# Patient Record
Sex: Female | Born: 1935 | Race: White | Hispanic: No | Marital: Married | Smoking: Never smoker
Health system: Southern US, Community
[De-identification: ages and names within clinical notes are randomized; demographics above are authoritative.]

## PROBLEM LIST (undated history)

## (undated) DIAGNOSIS — E785 Hyperlipidemia, unspecified: Secondary | ICD-10-CM

## (undated) DIAGNOSIS — K635 Polyp of colon: Secondary | ICD-10-CM

## (undated) DIAGNOSIS — K5790 Diverticulosis of intestine, part unspecified, without perforation or abscess without bleeding: Secondary | ICD-10-CM

## (undated) DIAGNOSIS — G4730 Sleep apnea, unspecified: Secondary | ICD-10-CM

## (undated) DIAGNOSIS — I10 Essential (primary) hypertension: Secondary | ICD-10-CM

## (undated) DIAGNOSIS — I2510 Atherosclerotic heart disease of native coronary artery without angina pectoris: Secondary | ICD-10-CM

## (undated) DIAGNOSIS — M810 Age-related osteoporosis without current pathological fracture: Secondary | ICD-10-CM

## (undated) DIAGNOSIS — M1990 Unspecified osteoarthritis, unspecified site: Secondary | ICD-10-CM

## (undated) DIAGNOSIS — Z8619 Personal history of other infectious and parasitic diseases: Secondary | ICD-10-CM

## (undated) DIAGNOSIS — Z5189 Encounter for other specified aftercare: Secondary | ICD-10-CM

## (undated) DIAGNOSIS — K219 Gastro-esophageal reflux disease without esophagitis: Secondary | ICD-10-CM

## (undated) HISTORY — PX: COLONOSCOPY: SHX174

## (undated) HISTORY — DX: Sleep apnea, unspecified: G47.30

## (undated) HISTORY — PX: REPLACEMENT TOTAL KNEE BILATERAL: SUR1225

## (undated) HISTORY — DX: Polyp of colon: K63.5

## (undated) HISTORY — PX: CATARACT EXTRACTION, BILATERAL: SHX1313

## (undated) HISTORY — DX: Age-related osteoporosis without current pathological fracture: M81.0

## (undated) HISTORY — PX: UPPER GASTROINTESTINAL ENDOSCOPY: SHX188

## (undated) HISTORY — DX: Personal history of other infectious and parasitic diseases: Z86.19

## (undated) HISTORY — DX: Hyperlipidemia, unspecified: E78.5

## (undated) HISTORY — DX: Diverticulosis of intestine, part unspecified, without perforation or abscess without bleeding: K57.90

## (undated) HISTORY — DX: Atherosclerotic heart disease of native coronary artery without angina pectoris: I25.10

## (undated) HISTORY — DX: Essential (primary) hypertension: I10

## (undated) HISTORY — PX: CARDIAC CATHETERIZATION: SHX172

## (undated) HISTORY — DX: Gastro-esophageal reflux disease without esophagitis: K21.9

## (undated) HISTORY — DX: Encounter for other specified aftercare: Z51.89

## (undated) HISTORY — PX: ABDOMINAL HYSTERECTOMY: SHX81

---

## 2014-05-07 ENCOUNTER — Emergency Department (HOSPITAL_COMMUNITY): Payer: No Typology Code available for payment source

## 2014-05-07 ENCOUNTER — Emergency Department (HOSPITAL_COMMUNITY)
Admission: EM | Admit: 2014-05-07 | Discharge: 2014-05-07 | Disposition: A | Discharge status: Home / Self Care | Payer: No Typology Code available for payment source | Attending: Emergency Medicine | Admitting: Emergency Medicine

## 2014-05-07 ENCOUNTER — Encounter (HOSPITAL_COMMUNITY): Payer: Self-pay | Admitting: Emergency Medicine

## 2014-05-07 DIAGNOSIS — Y92410 Unspecified street and highway as the place of occurrence of the external cause: Secondary | ICD-10-CM | POA: Diagnosis not present

## 2014-05-07 DIAGNOSIS — M1990 Unspecified osteoarthritis, unspecified site: Secondary | ICD-10-CM | POA: Diagnosis not present

## 2014-05-07 DIAGNOSIS — Z792 Long term (current) use of antibiotics: Secondary | ICD-10-CM | POA: Diagnosis not present

## 2014-05-07 DIAGNOSIS — R0789 Other chest pain: Secondary | ICD-10-CM

## 2014-05-07 DIAGNOSIS — Y998 Other external cause status: Secondary | ICD-10-CM | POA: Diagnosis not present

## 2014-05-07 DIAGNOSIS — S0990XA Unspecified injury of head, initial encounter: Secondary | ICD-10-CM | POA: Insufficient documentation

## 2014-05-07 DIAGNOSIS — Z791 Long term (current) use of non-steroidal anti-inflammatories (NSAID): Secondary | ICD-10-CM | POA: Diagnosis not present

## 2014-05-07 DIAGNOSIS — Z880 Allergy status to penicillin: Secondary | ICD-10-CM | POA: Diagnosis not present

## 2014-05-07 DIAGNOSIS — Z79899 Other long term (current) drug therapy: Secondary | ICD-10-CM | POA: Diagnosis not present

## 2014-05-07 DIAGNOSIS — S199XXA Unspecified injury of neck, initial encounter: Secondary | ICD-10-CM | POA: Insufficient documentation

## 2014-05-07 DIAGNOSIS — S60221A Contusion of right hand, initial encounter: Secondary | ICD-10-CM | POA: Insufficient documentation

## 2014-05-07 DIAGNOSIS — S299XXA Unspecified injury of thorax, initial encounter: Secondary | ICD-10-CM | POA: Diagnosis not present

## 2014-05-07 DIAGNOSIS — Y9389 Activity, other specified: Secondary | ICD-10-CM | POA: Diagnosis not present

## 2014-05-07 DIAGNOSIS — Z87891 Personal history of nicotine dependence: Secondary | ICD-10-CM | POA: Insufficient documentation

## 2014-05-07 HISTORY — DX: Unspecified osteoarthritis, unspecified site: M19.90
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
## 2014-05-07 NOTE — Discharge Instructions (Signed)
Chest Wall Pain °Chest wall pain is pain in or around the bones and muscles of your chest. It may take up to 6 weeks to get better. It may take longer if you must stay physically active in your work and activities.  °CAUSES  °Chest wall pain may happen on its own. However, it may be caused by: °· A viral illness like the flu. °· Injury. °· Coughing. °· Exercise. °· Arthritis. °· Fibromyalgia. °· Shingles. °HOME CARE INSTRUCTIONS  °· Avoid overtiring physical activity. Try not to strain or perform activities that cause pain. This includes any activities using your chest or your abdominal and side muscles, especially if heavy weights are used. °· Put ice on the sore area. °¨ Put ice in a plastic bag. °¨ Place a towel between your skin and the bag. °¨ Leave the ice on for 15-20 minutes per hour while awake for the first 2 days. °· Only take over-the-counter or prescription medicines for pain, discomfort, or fever as directed by your caregiver. °SEEK IMMEDIATE MEDICAL CARE IF:  °· Your pain increases, or you are very uncomfortable. °· You have a fever. °· Your chest pain becomes worse. °· You have new, unexplained symptoms. °· You have nausea or vomiting. °· You feel sweaty or lightheaded. °· You have a cough with phlegm (sputum), or you cough up blood. °MAKE SURE YOU:  °· Understand these instructions. °· Will watch your condition. °· Will get help right away if you are not doing well or get worse. °Document Released: 01/16/2005 Document Revised: 04/10/2011 Document Reviewed: 09/12/2010 °ExitCare® Patient Information ©2015 [employer]. This information is not intended to replace advice given to you by your health care provider. Make sure you discuss any questions you have with your health care provider. ° °Contusion °A contusion is a deep bruise. Contusions are the result of an injury that caused bleeding under the skin. The contusion may turn blue, purple, or yellow. Minor injuries will give you a painless  contusion, but more severe contusions may stay painful and swollen for a few weeks.  °CAUSES  °A contusion is usually caused by a blow, trauma, or direct force to an area of the body. °SYMPTOMS  °· Swelling and redness of the injured area. °· Bruising of the injured area. °· Tenderness and soreness of the injured area. °· Pain. °DIAGNOSIS  °The diagnosis can be made by taking a history and physical exam. An X-ray, CT scan, or MRI may be needed to determine if there were any associated injuries, such as fractures. °TREATMENT  °Specific treatment will depend on what area of the body was injured. In general, the best treatment for a contusion is resting, icing, elevating, and applying cold compresses to the injured area. Over-the-counter medicines may also be recommended for pain control. Ask your caregiver what the best treatment is for your contusion. °HOME CARE INSTRUCTIONS  °· Put ice on the injured area. °¨ Put ice in a plastic bag. °¨ Place a towel between your skin and the bag. °¨ Leave the ice on for 15-20 minutes, 3-4 times a day, or as directed by your health care provider. °· Only take over-the-counter or prescription medicines for pain, discomfort, or fever as directed by your caregiver. Your caregiver may recommend avoiding anti-inflammatory medicines (aspirin, ibuprofen, and naproxen) for 48 hours because these medicines may increase bruising. °· Rest the injured area. °· If possible, elevate the injured area to reduce swelling. °SEEK IMMEDIATE MEDICAL CARE IF:  °· You have increased   bruising or swelling. °· You have pain that is getting worse. °· Your swelling or pain is not relieved with medicines. °MAKE SURE YOU:  °· Understand these instructions. °· Will watch your condition. °· Will get help right away if you are not doing well or get worse. °Document Released: 10/26/2004 Document Revised: 01/21/2013 Document Reviewed: 11/21/2010 °ExitCare® Patient Information ©2015 [employer]. This information is  not intended to replace advice given to you by your health care provider. Make sure you discuss any questions you have with your health care provider. ° °Head Injury °You have received a head injury. It does not appear serious at this time. Headaches and vomiting are common following head injury. It should be easy to awaken from sleeping. Sometimes it is necessary for you to stay in the emergency department for a while for observation. Sometimes admission to the hospital may be needed. After injuries such as yours, most problems occur within the first 24 hours, but side effects may occur up to 7-10 days after the injury. It is important for you to carefully monitor your condition and contact your health care provider or seek immediate medical care if there is a change in your condition. °WHAT ARE THE TYPES OF HEAD INJURIES? °Head injuries can be as minor as a bump. Some head injuries can be more severe. More severe head injuries include: °· A jarring injury to the brain (concussion). °· A bruise of the brain (contusion). This mean there is bleeding in the brain that can cause swelling. °· A cracked skull (skull fracture). °· Bleeding in the brain that collects, clots, and forms a bump (hematoma). °WHAT CAUSES A HEAD INJURY? °A serious head injury is most likely to happen to someone who is in a car wreck and is not wearing a seat belt. Other causes of major head injuries include bicycle or motorcycle accidents, sports injuries, and falls. °HOW ARE HEAD INJURIES DIAGNOSED? °A complete history of the event leading to the injury and your current symptoms will be helpful in diagnosing head injuries. Many times, pictures of the brain, such as CT or MRI are needed to see the extent of the injury. Often, an overnight hospital stay is necessary for observation.  °WHEN SHOULD I SEEK IMMEDIATE MEDICAL CARE?  °You should get help right away if: °· You have confusion or drowsiness. °· You feel sick to your stomach (nauseous) or  have continued, forceful vomiting. °· You have dizziness or unsteadiness that is getting worse. °· You have severe, continued headaches not relieved by medicine. Only take over-the-counter or prescription medicines for pain, fever, or discomfort as directed by your health care provider. °· You do not have normal function of the arms or legs or are unable to walk. °· You notice changes in the black spots in the center of the colored part of your eye (pupil). °· You have a clear or bloody fluid coming from your nose or ears. °· You have a loss of vision. °During the next 24 hours after the injury, you must stay with someone who can watch you for the warning signs. This person should contact local emergency services (911 in the [place] if you have seizures, you become unconscious, or you are unable to wake up. °HOW CAN I PREVENT A HEAD INJURY IN THE FUTURE? °The most important factor for preventing major head injuries is avoiding motor vehicle accidents.  To minimize the potential for damage to your head, it is crucial to wear seat belts while riding in motor   vehicles. Wearing helmets while bike riding and playing collision sports (like football) is also helpful. Also, avoiding dangerous activities around the house will further help reduce your risk of head injury.  °WHEN CAN I RETURN TO NORMAL ACTIVITIES AND ATHLETICS? °You should be reevaluated by your health care provider before returning to these activities. If you have any of the following symptoms, you should not return to activities or contact sports until 1 week after the symptoms have stopped: °· Persistent headache. °· Dizziness or vertigo. °· Poor attention and concentration. °· Confusion. °· Memory problems. °· Nausea or vomiting. °· Fatigue or tire easily. °· Irritability. °· Intolerant of bright lights or loud noises. °· Anxiety or depression. °· Disturbed sleep. °MAKE SURE YOU:  °· Understand these instructions. °· Will watch your condition. °· Will get  help right away if you are not doing well or get worse. °Document Released: 01/16/2005 Document Revised: 01/21/2013 Document Reviewed: 09/23/2012 °ExitCare® Patient Information ©2015 [employer]. This information is not intended to replace advice given to you by your health care provider. Make sure you discuss any questions you have with your health care provider. ° °Motor Vehicle Collision °It is common to have multiple bruises and sore muscles after a motor vehicle collision (MVC). These tend to feel worse for the first 24 hours. You may have the most stiffness and soreness over the first several hours. You may also feel worse when you wake up the first morning after your collision. After this point, you will usually begin to improve with each day. The speed of improvement often depends on the severity of the collision, the number of injuries, and the location and nature of these injuries. °HOME CARE INSTRUCTIONS °· Put ice on the injured area. °¨ Put ice in a plastic bag. °¨ Place a towel between your skin and the bag. °¨ Leave the ice on for 15-20 minutes, 3-4 times a day, or as directed by your health care provider. °· Drink enough fluids to keep your urine clear or pale yellow. Do not drink alcohol. °· Take a warm shower or bath once or twice a day. This will increase blood flow to sore muscles. °· You may return to activities as directed by your caregiver. Be careful when lifting, as this may aggravate neck or back pain. °· Only take over-the-counter or prescription medicines for pain, discomfort, or fever as directed by your caregiver. Do not use aspirin. This may increase bruising and bleeding. °SEEK IMMEDIATE MEDICAL CARE IF: °· You have numbness, tingling, or weakness in the arms or legs. °· You develop severe headaches not relieved with medicine. °· You have severe neck pain, especially tenderness in the middle of the back of your neck. °· You have changes in bowel or bladder control. °· There is  increasing pain in any area of the body. °· You have shortness of breath, light-headedness, dizziness, or fainting. °· You have chest pain. °· You feel sick to your stomach (nauseous), throw up (vomit), or sweat. °· You have increasing abdominal discomfort. °· There is blood in your urine, stool, or vomit. °· You have pain in your shoulder (shoulder strap areas). °· You feel your symptoms are getting worse. °MAKE SURE YOU: °· Understand these instructions. °· Will watch your condition. °· Will get help right away if you are not doing well or get worse. °Document Released: 01/16/2005 Document Revised: 06/02/2013 Document Reviewed: 06/15/2010 °ExitCare® Patient Information ©2015 [employer]. This information is not intended to replace advice given   to you by your health care provider. Make sure you discuss any questions you have with your health care provider. ° °

## 2014-05-07 NOTE — ED Notes (Signed)
Discharge instructions given, pt demonstrated teach back and verbal understanding. No concerns voiced.  
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
## 2019-12-15 ENCOUNTER — Telehealth: Payer: Self-pay

## 2019-12-15 NOTE — Telephone Encounter (Signed)
Pt called to establish care with a new GI, pt is wishing to set up an appt with Dr Marina Goodell but has seen [facility] within the past year, advised pt to have records sent over to review.

## 2019-12-18 NOTE — Telephone Encounter (Signed)
Hey Dr Marina Goodell, this pt is wishing to establish care with a new gi because her MRI showed thickening of the esophagus and pt would like a repeat EGD but it looks like the pt has seen [facility] within the past year (unhappy with them). I will send the records to you for review, please advise on scheduling.

## 2019-12-31 NOTE — Telephone Encounter (Signed)
Okay for routine office follow-up with me.  Alonna Buckler, CMA will keep records.

## 2020-01-01 NOTE — Telephone Encounter (Signed)
Records sent down for appointment to be scheduled.

## 2020-01-08 ENCOUNTER — Encounter: Payer: Self-pay | Admitting: Internal Medicine

## 2020-01-08 ENCOUNTER — Ambulatory Visit (INDEPENDENT_AMBULATORY_CARE_PROVIDER_SITE_OTHER): Payer: Medicare Other | Admitting: Internal Medicine

## 2020-01-08 VITALS — BP 112/67 | HR 54 | Ht 66.0 in | Wt 171.0 lb

## 2020-01-08 DIAGNOSIS — R9389 Abnormal findings on diagnostic imaging of other specified body structures: Secondary | ICD-10-CM | POA: Diagnosis not present

## 2020-01-08 DIAGNOSIS — K219 Gastro-esophageal reflux disease without esophagitis: Secondary | ICD-10-CM

## 2020-01-08 DIAGNOSIS — R195 Other fecal abnormalities: Secondary | ICD-10-CM

## 2020-01-08 NOTE — Progress Notes (Signed)
HISTORY OF PRESENT ILLNESS:  [name] is a delightful 86 y.o. female, acquaintance of [name], from [place] with past medical history as listed below who presents herself regarding abnormal CT imaging and the need for upper endoscopy.  Patient was noted to have mild anemia for which she underwent Hemoccult testing with her PCP.  This returned positive.  She then underwent colonoscopy in [place] on October 09, 2019.  He was found to have mild diverticulosis and a diminutive colon polyp which was removed hemoglobin from July 29, 2019 was 12.0.  Patient was having some right lower quadrant/hip pain for which he underwent CT scan of the abdomen and pelvis with contrast November 14, 2019.  No acute abnormalities.  However, there was mild concentric thickening of the distal esophageal wall.  Upper endoscopy recommended.  Patient preferred being evaluated in this office at this time regarding that issue.  She does have a history of chronic reflux for which she takes omeprazole.  This works nicely.  She denies dysphagia.  Weight has been stable.  Had been on NSAIDs for arthritis.  She does have occasional problems with bloating and gas.  She has completed her Covid vaccination series as well as booster  REVIEW OF SYSTEMS:  All non-GI ROS negative unless otherwise stated in the HPI except for arthritis, back pain  Past Medical History:  Diagnosis Date  . Arthritis   . CAD (coronary artery disease)   . Colon polyps   . Diverticulosis   . GERD (gastroesophageal reflux disease)   . History of hepatitis B    + 50 years ago  . Hyperlipemia   . Hypertension     Past Surgical History:  Procedure Laterality Date  . ABDOMINAL HYSTERECTOMY    . CARDIAC CATHETERIZATION    . REPLACEMENT TOTAL KNEE BILATERAL     2 replacement on right knee and one on left knee    Social History [name]  reports that she has never smoked. She has never used smokeless tobacco. She reports  current alcohol use of about 1.0 standard drink of alcohol per week. She reports that she does not use drugs.  family history includes Colon cancer in her paternal grandmother.  Allergies  Allergen Reactions  . Clindamycin/Lincomycin Diarrhea    Increase diarrhea-dx with C.diff  . Codeine Nausea And Vomiting  . Hydrocodone-Acetaminophen   . Nitrofurantoin Other (See Comments)    Chills and fever  . Oxycodone   . Amoxicillin Rash  . Sulfa Antibiotics Rash       PHYSICAL EXAMINATION: Vital signs: BP 112/67   Pulse (!) 54   Ht 5\' 6"  (1.676 m)   Wt 171 lb (77.6 kg)   SpO2 97%   BMI 27.60 kg/m   Constitutional: generally well-appearing, no acute distress Psychiatric: alert and oriented x3, cooperative Eyes: extraocular movements intact, anicteric, conjunctiva pink Mouth: oral pharynx moist, no lesions Neck: supple no lymphadenopathy Cardiovascular: heart regular rate and rhythm, no murmur Lungs: clear to auscultation bilaterally Abdomen: soft, nontender, nondistended, no obvious ascites, no peritoneal signs, normal bowel sounds, no organomegaly Rectal: Omitted Extremities: no clubbing, cyanosis, or lower extremity edema bilaterally Skin: no lesions on visible extremities Neuro: No focal deficits.  Cranial nerves intact  ASSESSMENT:  1.  Chronic GERD.  Asymptomatic on PPI 2.  Esophageal thickening on CT scan.  Requires clarification with endoscopy 3.  Mild anemia and Hemoccult-positive stool 4.  Recent colonoscopy with diminutive polyp and diverticulosis 5.  History of arthritis.  Had been on NSAIDs   PLAN:  1.  Continue PPI 2.  Schedule upper endoscopy.The nature of the procedure, as well as the risks, benefits, and alternatives were carefully and thoroughly reviewed with the patient. Ample time for discussion and questions allowed. The patient understood, was satisfied, and agreed to proceed. 3.  Reflux precautions

## 2020-01-08 NOTE — Patient Instructions (Signed)
If you are age 84 or older, your body mass index should be between 23-30. Your Body mass index is 27.6 kg/m. If this is out of the aforementioned range listed, please consider follow up with your Primary Care Provider.  If you are age 21 or younger, your body mass index should be between 19-25. Your Body mass index is 27.6 kg/m. If this is out of the aformentioned range listed, please consider follow up with your Primary Care Provider.   You have been scheduled for an endoscopy. Please follow written instructions given to you at your visit today. If you use inhalers (even only as needed), please bring them with you on the day of your procedure.

## 2020-02-13 ENCOUNTER — Encounter: Payer: Medicare Other | Admitting: Internal Medicine

## 2020-02-17 ENCOUNTER — Encounter: Payer: Medicare Other | Admitting: Internal Medicine

## 2020-03-11 ENCOUNTER — Ambulatory Visit (AMBULATORY_SURGERY_CENTER): Payer: Medicare Other | Admitting: Internal Medicine

## 2020-03-11 ENCOUNTER — Other Ambulatory Visit: Payer: Self-pay

## 2020-03-11 ENCOUNTER — Encounter: Payer: Self-pay | Admitting: Internal Medicine

## 2020-03-11 VITALS — BP 133/63 | HR 57 | Temp 96.9°F | Resp 15 | Ht 66.0 in | Wt 171.0 lb

## 2020-03-11 DIAGNOSIS — K219 Gastro-esophageal reflux disease without esophagitis: Secondary | ICD-10-CM

## 2020-03-11 DIAGNOSIS — K222 Esophageal obstruction: Secondary | ICD-10-CM | POA: Diagnosis not present

## 2020-03-11 DIAGNOSIS — K449 Diaphragmatic hernia without obstruction or gangrene: Secondary | ICD-10-CM

## 2020-03-11 DIAGNOSIS — R195 Other fecal abnormalities: Secondary | ICD-10-CM

## 2020-03-11 DIAGNOSIS — R9389 Abnormal findings on diagnostic imaging of other specified body structures: Secondary | ICD-10-CM | POA: Diagnosis not present

## 2020-03-11 MED ORDER — SODIUM CHLORIDE 0.9 % IV SOLN: 500.0000 mL | Freq: Once | INTRAVENOUS | Status: DC

## 2020-03-11 NOTE — Patient Instructions (Signed)
Please read handouts provided. Continue present medications. Return to care of primary provider.     YOU HAD AN ENDOSCOPIC PROCEDURE TODAY AT THE [facility]:   Refer to the procedure report that was given to you for any specific questions about what was found during the examination.  If the procedure report does not answer your questions, please call your gastroenterologist to clarify.  If you requested that your care partner not be given the details of your procedure findings, then the procedure report has been included in a sealed envelope for you to review at your convenience later.  YOU SHOULD EXPECT: Some feelings of bloating in the abdomen. Passage of more gas than usual.  Walking can help get rid of the air that was put into your GI tract during the procedure and reduce the bloating. If you had a lower endoscopy (such as a colonoscopy or flexible sigmoidoscopy) you may notice spotting of blood in your stool or on the toilet paper. If you underwent a bowel prep for your procedure, you may not have a normal bowel movement for a few days.  Please Note:  You might notice some irritation and congestion in your nose or some drainage.  This is from the oxygen used during your procedure.  There is no need for concern and it should clear up in a day or so.  SYMPTOMS TO REPORT IMMEDIATELY:    Following upper endoscopy (EGD)  Vomiting of blood or coffee ground material  New chest pain or pain under the shoulder blades  Painful or persistently difficult swallowing  New shortness of breath  Fever of 100F or higher  Black, tarry-looking stools  For urgent or emergent issues, a gastroenterologist can be reached at any hour by calling [phone]. Do not use MyChart messaging for urgent concerns.    DIET:  We do recommend a small meal at first, but then you may proceed to your regular diet.  Drink plenty of fluids but you should avoid alcoholic beverages for 24  hours.  ACTIVITY:  You should plan to take it easy for the rest of today and you should NOT DRIVE or use heavy machinery until tomorrow (because of the sedation medicines used during the test).    FOLLOW UP: Our staff will call the number listed on your records 48-72 hours following your procedure to check on you and address any questions or concerns that you may have regarding the information given to you following your procedure. If we do not reach you, we will leave a message.  We will attempt to reach you two times.  During this call, we will ask if you have developed any symptoms of COVID 19. If you develop any symptoms (ie: fever, flu-like symptoms, shortness of breath, cough etc.) before then, please call [phone].  If you test positive for Covid 19 in the 2 weeks post procedure, please call and report this information to [place].    If any biopsies were taken you will be contacted by phone or by letter within the next 1-3 weeks.  Please call us at [phone] if you have not heard about the biopsies in 3 weeks.    SIGNATURES/CONFIDENTIALITY: You and/or your care partner have signed paperwork which will be entered into your electronic medical record.  These signatures attest to the fact that that the information above on your After Visit Summary has been reviewed and is understood.  Full responsibility of the confidentiality of this discharge information lies with  you and/or your care-partner.

## 2020-03-11 NOTE — Progress Notes (Signed)
VS- Suzanne Hodges RN 

## 2020-03-11 NOTE — Op Note (Signed)
[facility] Patient Name: [name] Procedure Date: 03/11/2020 10:22 AM MRN: [id number] Endoscopist: Wilhemina Bonito. Marina Goodell , MD Age: 87 Referring MD:  Date of Birth: 05-Nov-1930 Gender: Female Account #: [id number] Procedure:                Upper GI endoscopy Indications:              Esophageal reflux, Heme positive stool, Abnormal CT                            of the GI tract (esophageal thickening) Medicines:                Monitored Anesthesia Care Procedure:                Pre-Anesthesia Assessment:                           - Prior to the procedure, a History and Physical                            was performed, and patient medications and                            allergies were reviewed. The patient's tolerance of                            previous anesthesia was also reviewed. The risks                            and benefits of the procedure and the sedation                            options and risks were discussed with the patient.                            All questions were answered, and informed consent                            was obtained. Prior Anticoagulants: The patient has                            taken no previous anticoagulant or antiplatelet                            agents. ASA Grade Assessment: II - A patient with                            mild systemic disease. After reviewing the risks                            and benefits, the patient was deemed in                            satisfactory condition to undergo the procedure.  After obtaining informed consent, the endoscope was                            passed under direct vision. Throughout the                            procedure, the patient's blood pressure, pulse, and                            oxygen saturations were monitored continuously. The                            Endoscope was introduced through the mouth, and                            advanced to the  second part of duodenum. The upper                            GI endoscopy was accomplished without difficulty.                            The patient tolerated the procedure well. Scope In: Scope Out: Findings:                 The esophagus revealed a large caliber distal                            esophageal ring, but was otherwise normal.                           The stomach was normal, save small hiatal hernia.                           The examined duodenum was normal.                           The cardia and gastric fundus were normal on                            retroflexion. Complications:            No immediate complications. Estimated Blood Loss:     Estimated blood loss: none. Impression:               1. GERD                           2. Incidental distal esophageal ring                           3. Small hiatal hernia. Otherwise normal EGD. Recommendation:           - Patient has a contact number available for                            emergencies. The signs and symptoms of potential  delayed complications were discussed with the                            patient. Return to normal activities tomorrow.                            Written discharge instructions were provided to the                            patient.                           - Resume previous diet.                           - Continue present medications.                           - Return to the care of your primary provider Wilhemina Bonito. Marina Goodell, MD 03/11/2020 10:38:37 AM This report has been signed electronically.

## 2020-03-15 ENCOUNTER — Telehealth: Payer: Self-pay | Admitting: *Deleted

## 2020-03-15 NOTE — Telephone Encounter (Signed)
  Follow up Call-  Call back number 03/11/2020  Post procedure Call Back phone  # [phone]  Permission to leave phone message Yes  Some recent data might be hidden     Patient questions:  Do you have a fever, pain , or abdominal swelling? No. Pain Score  0 *  Have you tolerated food without any problems? Yes.    Have you been able to return to your normal activities? Yes.    Do you have any questions about your discharge instructions: Diet   No. Medications  No. Follow up visit  No.  Do you have questions or concerns about your Care? No.  Actions: * If pain score is 4 or above: No action needed, pain <4.  1. Have you developed a fever since your procedure? no  2.   Have you had an respiratory symptoms (SOB or cough) since your procedure? no  3.   Have you tested positive for COVID 19 since your procedure no  4.   Have you had any family members/close contacts diagnosed with the COVID 19 since your procedure?  no   If yes to any of these questions please route to Laverna Peace, RN and Karlton Lemon, RN

## 2022-03-20 ENCOUNTER — Encounter (HOSPITAL_BASED_OUTPATIENT_CLINIC_OR_DEPARTMENT_OTHER): Payer: Medicare Other | Attending: General Surgery | Admitting: General Surgery

## 2022-03-20 DIAGNOSIS — I10 Essential (primary) hypertension: Secondary | ICD-10-CM | POA: Diagnosis not present

## 2022-03-20 DIAGNOSIS — I890 Lymphedema, not elsewhere classified: Secondary | ICD-10-CM | POA: Insufficient documentation

## 2022-03-20 DIAGNOSIS — I2510 Atherosclerotic heart disease of native coronary artery without angina pectoris: Secondary | ICD-10-CM | POA: Insufficient documentation

## 2022-03-20 DIAGNOSIS — L97511 Non-pressure chronic ulcer of other part of right foot limited to breakdown of skin: Secondary | ICD-10-CM | POA: Diagnosis not present

## 2022-03-20 DIAGNOSIS — L97822 Non-pressure chronic ulcer of other part of left lower leg with fat layer exposed: Secondary | ICD-10-CM | POA: Diagnosis not present

## 2022-03-20 DIAGNOSIS — L97811 Non-pressure chronic ulcer of other part of right lower leg limited to breakdown of skin: Secondary | ICD-10-CM | POA: Insufficient documentation

## 2022-03-20 DIAGNOSIS — I872 Venous insufficiency (chronic) (peripheral): Secondary | ICD-10-CM | POA: Diagnosis not present

## 2022-03-21 NOTE — Progress Notes (Signed)
[name] ([id number]) [id number] Page 1 of 4 Visit Report for 03/20/2022 Abuse Risk Screen Details Patient Name: Date of Service: [name], [name] 03/20/2022 12:30 PM Medical Record Number: [id number] Patient Account Number: [id number] Date of Birth/Sex: Treating RN: 26-Oct-1930 (89 y.o. [name] Primary Care Denia Mcvicar: [name] Other Clinician: Referring Kaysee Hergert: Treating Antwyne Pingree/Extender: Jadene Pierini in Treatment: 0 Abuse Risk Screen Items Answer ABUSE RISK SCREEN: Has anyone close to you tried to hurt or harm you recentlyo No Do you feel uncomfortable with anyone in your familyo No Has anyone forced you do things that you didnt want to doo No Electronic Signature(s) Signed: 03/20/2022 4:41:15 PM By: [name] Entered By: [name] on 03/20/2022 12:50:34 -------------------------------------------------------------------------------- Activities of Daily Living Details Patient Name: Date of Service: [name] 03/20/2022 12:30 PM Medical Record Number: [id number] Patient Account Number: [id number] Date of Birth/Sex: Treating RN: [birth year] (89 y.o. [name] Primary Care Keeton Kassebaum: [name] Other Clinician: Referring Layaan Mott: Treating Joie Hipps/Extender: Jadene Pierini in Treatment: 0 Activities of Daily Living Items Answer Activities of Daily Living (Please select one for each item) Drive Automobile Completely Able T Medications ake Completely Able Use T elephone Completely Able Care for Appearance Completely Able Use T oilet Completely Able Bath / Shower Completely Able Dress Self Completely Able Feed Self Completely Able Walk Need Assistance Get In / Out Bed Completely Able Housework Need Assistance Prepare Meals Need Assistance Handle Money Completely Able Shop for Self Need Assistance Electronic Signature(s) Signed: 03/20/2022 4:41:15 PM By:  [name] Entered By: [name] on 03/20/2022 12:51:04 [name] ([id number]) [id number] Page 2 of 4 -------------------------------------------------------------------------------- Education Screening Details Patient Name: Date of Service: [name], [name] 03/20/2022 12:30 PM Medical Record Number: [id number] Patient Account Number: [id number] Date of Birth/Sex: Treating RN: 1930/12/04 (89 y.o. [name] Primary Care [name]: [name] Other Clinician: Referring Shresta Risden: Treating Donni Oglesby/Extender: Jadene Pierini in Treatment: 0 Primary Learner Assessed: Patient Learning Preferences/Education Level/Primary Language Learning Preference: Explanation, Demonstration, Video, Printed Material Highest Education Level: College or Above Preferred Language: English Cognitive Barrier Language Barrier: No Translator Needed: No Memory Deficit: No Emotional Barrier: No Cultural/Religious Beliefs Affecting Medical Care: No Physical Barrier Impaired Vision: No Impaired Hearing: No Decreased Hand dexterity: No Knowledge/Comprehension Knowledge Level: Medium Comprehension Level: Medium Ability to understand written instructions: Medium Ability to understand verbal instructions: Medium Motivation Anxiety Level: Calm Cooperation: Cooperative Education Importance: Acknowledges Need Interest in Health Problems: Asks Questions Perception: Coherent Willingness to Engage in Self-Management Medium Activities: Readiness to Engage in Self-Management Medium Activities: Electronic Signature(s) Signed: 03/20/2022 4:41:15 PM By: [name] Entered By: [name] on 03/20/2022 12:51:30 -------------------------------------------------------------------------------- Fall Risk Assessment Details Patient Name: Date of Service: [name], [name] 03/20/2022 12:30 PM Medical Record Number: [id number] Patient  Account Number: [id number] Date of Birth/Sex: Treating RN: December 23, 1930 (89 y.o. [name] Primary Care [name]: [name] Other Clinician: Referring [name]: Treating Waller Marcussen/Extender: Jadene Pierini in Treatment: 0 Fall Risk Assessment Items Have you had 2 or more falls in the last [address] [place], [place] ([id number]) [phone] [id number] Page 3 of 4 Have you had any fall that resulted in injury in the last 12 monthso 0 Yes FALLS RISK SCREEN History of falling - immediate or within 3 months 25 Yes Secondary diagnosis (Do you have 2 or more medical diagnoseso) 15 Yes Ambulatory aid None/bed rest/wheelchair/nurse 0 No Crutches/cane/walker 15 Yes Furniture 0 No Intravenous therapy  Access/Saline/Heparin Lock 0 No Gait/Transferring Normal/ bed rest/ wheelchair 0 No Weak (short steps with or without shuffle, stooped but able to lift head while walking, may seek 10 Yes support from furniture) Impaired (short steps with shuffle, may have difficulty arising from chair, head down, impaired 20 Yes balance) Mental Status Oriented to own ability 0 Yes Electronic Signature(s) Signed: 03/20/2022 4:41:15 PM By: [name] Entered By: [name] on 03/20/2022 12:52:01 -------------------------------------------------------------------------------- Foot Assessment Details Patient Name: Date of Service: [name], [name] 03/20/2022 12:30 PM Medical Record Number: [id number] Patient Account Number: [id number] Date of Birth/Sex: Treating RN: September 12, 1930 (89 y.o. [name] Primary Care Zachry Hopfensperger: [name] Other Clinician: Referring Abubakr Wieman: Treating [name]/Extender: Jadene Pierini in Treatment: 0 Foot Assessment Items Site Locations + = Sensation present, - = Sensation absent, C = Callus, U = Ulcer R = Redness, W = Warmth, M = Maceration, PU = Pre-ulcerative lesion F = Fissure, S = Swelling, D =  Dryness Assessment Right: Left: Other Deformity: No No Prior Foot Ulcer: No No Prior Amputation: No No Charcot Joint: No No Ambulatory Status: Ambulatory With Help Assistance Device: Graviela Shubin, Arbie [name] ([id number]) [phone] [id number] Page 4 of 4 Gait: Notes no hx of diabetes or neuropathy Electronic Signature(s) Signed: 03/20/2022 4:41:15 PM By: [name] Entered By: [name] on 03/20/2022 12:52:50 -------------------------------------------------------------------------------- Nutrition Risk Screening Details Patient Name: Date of Service: [name], [name] 03/20/2022 12:30 PM Medical Record Number: [id number] Patient Account Number: [id number] Date of Birth/Sex: Treating RN: [birth year] (89 y.o. [name] Primary Care Violette Morneault: [name] Other Clinician: Referring Johanna Matto: Treating Silviano Neuser/Extender: Jadene Pierini in Treatment: 0 Height (in): Weight (lbs): Body Mass Index (BMI): Nutrition Risk Screening Items Score Screening NUTRITION RISK SCREEN: I have an illness or condition that made me change the kind and/or amount of food I eat 0 No I eat fewer than two meals per day 0 No I eat few fruits and vegetables, or milk products 0 No I have three or more drinks of beer, liquor or wine almost every day 0 No I have tooth or mouth problems that make it hard for me to eat 0 No I don't always have enough money to buy the food I need 0 No I eat alone most of the time 0 No I take three or more different prescribed or over-the-counter drugs a day 1 Yes Without wanting to, I have lost or gained 10 pounds in the last six months 0 No I am not always physically able to shop, cook and/or feed myself 2 Yes Nutrition Protocols Good Risk Protocol Moderate Risk Protocol 0 Provide education on nutrition High Risk Proctocol Risk Level: Moderate Risk Score: 3 Electronic Signature(s) Signed: 03/20/2022 4:41:15 PM By:  [name] Entered By: [name] on 03/20/2022 12:52:29

## 2022-03-21 NOTE — Progress Notes (Addendum)
AADRIKA, JUNGBLUTH ([id number]) [id number] Page 1 of 13 Visit Report for 03/20/2022 Chief Complaint Document Details Patient Name: Date of Service: [name], [name] 03/20/2022 12:30 PM Medical Record Number: [id number] Patient Account Number: [id number] Date of Birth/Sex: Treating RN: April 30, 1930 (89 y.o. F) Primary Care Provider: [name] Other Clinician: Referring Provider: Treating Provider/Extender: Jadene Pierini in Treatment: 0 Information Obtained from: Patient Chief Complaint Patient presents for treatment of open ulcers due to venous insufficiency Electronic Signature(s) Signed: 03/20/2022 1:50:55 PM By: Fredirick Maudlin MD FACS Previous Signature: 03/20/2022 12:47:33 PM Version By: Fredirick Maudlin MD FACS Entered By: Fredirick Maudlin on 03/20/2022 13:50:54 -------------------------------------------------------------------------------- Debridement Details Patient Name: Date of Service: [name], [name] 03/20/2022 12:30 PM Medical Record Number: [id number] Patient Account Number: [id number] Date of Birth/Sex: Treating RN: [birth year] (89 y.o. [name] Primary Care Provider: [name] Other Clinician: Referring Provider: Treating Provider/Extender: Jadene Pierini in Treatment: 0 Debridement Performed for Assessment: Wound #1 Left,Distal,Lateral Lower Leg Performed By: Physician Fredirick Maudlin, MD Debridement Type: Debridement Level of Consciousness (Pre-procedure): Awake and Alert Pre-procedure Verification/Time Out Yes - 13:23 Taken: Start Time: 13:23 Pain Control: Lidocaine 4% T opical Solution T Area Debrided (L x W): otal 1.2 (cm) x 0.5 (cm) = 0.6 (cm) Tissue and other material debrided: Non-Viable, Slough, Subcutaneous, Slough Level: Skin/Subcutaneous Tissue Debridement Description: Excisional Instrument: Curette Bleeding: Minimum Hemostasis Achieved: Pressure Response to Treatment: Procedure was  tolerated well Level of Consciousness (Post- Awake and Alert procedure): Post Debridement Measurements of Total Wound Length: (cm) 1.2 Width: (cm) 0.5 Depth: (cm) 0.2 Volume: (cm) 0.094 Character of Wound/Ulcer Post Debridement: Improved Post Procedure Diagnosis Same as Pre-procedure Notes scribed for Dr. Celine Ahr by [name], RN Electronic Signature(s) Signed: 03/20/2022 4:41:15 PM By: [name] Signed: 03/20/2022 5:01:55 PM By: Fredirick Maudlin MD FACS Entered By: [name] on 03/20/2022 13:24:55 [name], [name] ([id number]) [id number] Page 2 of 13 -------------------------------------------------------------------------------- Debridement Details Patient Name: Date of Service: [name], [name] 03/20/2022 12:30 PM Medical Record Number: [id number] Patient Account Number: [id number] Date of Birth/Sex: Treating RN: 03/20/1930 (89 y.o. [name] Primary Care Provider: [name] Other Clinician: Referring Provider: Treating Provider/Extender: Jadene Pierini in Treatment: 0 Debridement Performed for Assessment: Wound #2 Left,Proximal,Lateral Lower Leg Performed By: Physician Fredirick Maudlin, MD Debridement Type: Debridement Level of Consciousness (Pre-procedure): Awake and Alert Pre-procedure Verification/Time Out Yes - 13:23 Taken: Start Time: 13:23 Pain Control: Lidocaine 4% Topical Solution T Area Debrided (L x W): otal 2 (cm) x 2 (cm) = 4 (cm) Tissue and other material debrided: Non-Viable, Eschar, Slough, Subcutaneous, Slough Level: Skin/Subcutaneous Tissue Debridement Description: Excisional Instrument: Curette Bleeding: Minimum Hemostasis Achieved: Pressure Response to Treatment: Procedure was tolerated well Level of Consciousness (Post- Awake and Alert procedure): Post Debridement Measurements of Total Wound Length: (cm) 2 Width: (cm) 2 Depth: (cm) 0.1 Volume: (cm) 0.314 Character  of Wound/Ulcer Post Debridement: Improved Post Procedure Diagnosis Same as Pre-procedure Notes scribed for Dr. Celine Ahr by [name], RN Electronic Signature(s) Signed: 03/20/2022 4:41:15 PM By: [name] Signed: 03/20/2022 5:01:55 PM By: Fredirick Maudlin MD FACS Entered By: [name] on 03/20/2022 13:26:15 -------------------------------------------------------------------------------- Debridement Details Patient Name: Date of Service: [name], [name] 03/20/2022 12:30 PM Medical Record Number: [id number] Patient Account Number: [id number] Date of Birth/Sex: Treating RN: 1930/04/24 (89 y.o. [name] Primary Care Provider: [name] Other Clinician: Referring Provider: Treating Provider/Extender: Jadene Pierini in Treatment: 0 Debridement Performed for Assessment: Wound #3 Right,Anterior Lower Leg  Performed By: Physician Fredirick Maudlin, MD Debridement Type: Debridement Level of Consciousness (Pre-procedure): Awake and Alert Pre-procedure Verification/Time Out Yes - 13:23 Taken: Start Time: 13:23 Pain Control: Lidocaine 4% T opical Solution T Area Debrided (L x W): otal 0.3 (cm) x 0.3 (cm) = 0.09 (cm) Tissue and other material debrided: Non-Viable, Eschar, Slough, Slough Level: Non-Viable Tissue Debridement Description: Selective/Open Wound Instrument: Curette Bleeding: Minimum Hemostasis Achieved: Pressure Response to Treatment: Procedure was tolerated well Level of Consciousness [name], [name] ([id number]) [id number] Page 3 of 13 Level of Consciousness (Post- Awake and Alert procedure): Post Debridement Measurements of Total Wound Length: (cm) 0.3 Width: (cm) 0.3 Depth: (cm) 0.1 Volume: (cm) 0.007 Character of Wound/Ulcer Post Debridement: Improved Post Procedure Diagnosis Same as Pre-procedure Notes scribed for Dr. Celine Ahr by [name], RN Electronic Signature(s) Signed:  03/20/2022 4:41:15 PM By: [name] Signed: 03/20/2022 5:01:55 PM By: Fredirick Maudlin MD FACS Entered By: [name] on 03/20/2022 13:27:11 -------------------------------------------------------------------------------- Debridement Details Patient Name: Date of Service: [name], [name] 03/20/2022 12:30 PM Medical Record Number: [id number] Patient Account Number: [id number] Date of Birth/Sex: Treating RN: 02/24/1930 (89 y.o. [name] Primary Care Provider: [name] Other Clinician: Referring Provider: Treating Provider/Extender: Jadene Pierini in Treatment: 0 Debridement Performed for Assessment: Wound #4 Right T Second oe Performed By: Physician Fredirick Maudlin, MD Debridement Type: Debridement Level of Consciousness (Pre-procedure): Awake and Alert Pre-procedure Verification/Time Out Yes - 13:23 Taken: Start Time: 13:23 Pain Control: Lidocaine 4% T opical Solution T Area Debrided (L x W): otal 0.2 (cm) x 0.2 (cm) = 0.04 (cm) Tissue and other material debrided: Non-Viable, Callus, Slough, Slough Level: Non-Viable Tissue Debridement Description: Selective/Open Wound Instrument: Curette Bleeding: Minimum Hemostasis Achieved: Pressure Response to Treatment: Procedure was tolerated well Level of Consciousness (Post- Awake and Alert procedure): Post Debridement Measurements of Total Wound Length: (cm) 0.2 Stage: Category/Stage II Width: (cm) 0.2 Depth: (cm) 0.1 Volume: (cm) 0.003 Character of Wound/Ulcer Post Debridement: Improved Post Procedure Diagnosis Same as Pre-procedure Notes scribed for Dr. Celine Ahr by [name], RN Electronic Signature(s) Signed: 03/20/2022 4:41:15 PM By: [name] Signed: 03/20/2022 5:01:55 PM By: Fredirick Maudlin MD FACS Entered By: [name] on 03/20/2022 13:29:24 HPI Details -------------------------------------------------------------------------------- [name]  ([id number]) [id number] Page 4 of 13 Patient Name: Date of Service: [name], [name] 03/20/2022 12:30 PM Medical Record Number: [id number] Patient Account Number: [id number] Date of Birth/Sex: Treating RN: 1930-04-30 (89 y.o. F) Primary Care Provider: [name] Other Clinician: Referring Provider: Treating Provider/Extender: Jadene Pierini in Treatment: 0 History of Present Illness HPI Description: ADMISSION 03/20/2022 This is an 89 year old woman with coronary artery disease, on chronic corticosteroids for osteoarthritis, who presents with bilateral lower leg ulcers. They have been present for a couple of weeks. I do not have any vascular studies available to discern whether or not she has had venous reflux or arterial studies done. ABIs in clinic were 1.34 on the left and 0.95 on the right. She is not diabetic. She does not smoke. Her legs are quite edematous and she has multiple hematomas of various sizes and ages extending up to at least the distal thigh. She has 3 ulcers on the left leg with slough and eschar accumulation. On the right leg, she has a tiny superficial ulcer on the anterior tibial surface. She also has a small ulcer on the lateral aspect of her right third toe. Electronic Signature(s) Signed: 03/20/2022 1:58:48 PM By: Fredirick Maudlin MD FACS Previous Signature: 03/20/2022 12:48:57 PM  Version By: Fredirick Maudlin MD FACS Entered By: Fredirick Maudlin on 03/20/2022 13:58:47 -------------------------------------------------------------------------------- Physical Exam Details Patient Name: Date of Service: [name], [name] 03/20/2022 12:30 PM Medical Record Number: [id number] Patient Account Number: [id number] Date of Birth/Sex: Treating RN: 1930-05-11 (89 y.o. F) Primary Care Provider: [name] Other Clinician: Referring Provider: Treating Provider/Extender: Jadene Pierini in Treatment: 0 Constitutional . . . . No  acute distress. Respiratory Normal work of breathing on room air. Cardiovascular 2+ pitting edema bilaterally. Notes 03/20/2022: Her legs are quite edematous and she has multiple hematomas of various sizes and ages extending up to at least the distal thigh. She has 3 ulcers on the left leg with slough and eschar accumulation. On the right leg, she has a tiny superficial ulcer on the anterior tibial surface. She also has a small ulcer on the lateral aspect of her right third toe. Electronic Signature(s) Signed: 03/20/2022 1:59:34 PM By: Fredirick Maudlin MD FACS Entered By: Fredirick Maudlin on 03/20/2022 13:59:34 -------------------------------------------------------------------------------- Physician Orders Details Patient Name: Date of Service: [name], [name] 03/20/2022 12:30 PM Medical Record Number: [id number] Patient Account Number: [id number] Date of Birth/Sex: Treating RN: 1930-03-01 (89 y.o. [name] Primary Care Provider: [name] Other Clinician: Referring Provider: Treating Provider/Extender: Jadene Pierini in Treatment: 0 Verbal / Phone Orders: No Diagnosis Coding ICD-10 Coding Code Description L97.811 Non-pressure chronic ulcer of other part of right lower leg limited to breakdown of skin L97.822 Non-pressure chronic ulcer of other part of left lower leg with fat layer exposed [place], [place] ([id number]) [id number] Page 5 of 13 L97.511 Non-pressure chronic ulcer of other part of right foot limited to breakdown of skin R60.0 Localized edema I87.2 Venous insufficiency (chronic) (peripheral) Z79.52 Long term (current) use of systemic steroids Follow-up Appointments ppointment in 1 week. - Dr. Celine Ahr - room 2 Return A Anesthetic (In clinic) Topical Lidocaine 4% applied to wound bed Bathing/ Shower/ Hygiene May shower with protection but do not get wound dressing(s) wet. Protect dressing(s) with water repellant cover  (for example, large plastic bag) or a cast cover and may then take shower. Edema Control - Lymphedema / SCD / Other Elevate legs to the level of the heart or above for 30 minutes daily and/or when sitting for 3-4 times a day throughout the day. Avoid standing for long periods of time. Wound Treatment Wound #1 - Lower Leg Wound Laterality: Left, Lateral, Distal Cleanser: Soap and Water 1 x Per Week/30 Days Discharge Instructions: May shower and wash wound with dial antibacterial soap and water prior to dressing change. Cleanser: Wound Cleanser 1 x Per Week/30 Days Discharge Instructions: Cleanse the wound with wound cleanser prior to applying a clean dressing using gauze sponges, not tissue or cotton balls. Peri-Wound Care: Sween Lotion (Moisturizing lotion) 1 x Per Week/30 Days Discharge Instructions: Apply moisturizing lotion as directed Prim Dressing: Sorbalgon AG Dressing 2x2 (in/in) 1 x Per Week/30 Days ary Discharge Instructions: Apply to wound bed as instructed Secondary Dressing: Woven Gauze Sponge, Non-Sterile 4x4 in 1 x Per Week/30 Days Discharge Instructions: Apply over primary dressing as directed. Compression Wrap: ThreePress (3 layer compression wrap) 1 x Per Week/30 Days Discharge Instructions: Apply three layer compression as directed. Compression Stockings: Circaid Juxta Lite Compression Wrap (DME) Left Leg Compression Amount: 30-40 mmHG Discharge Instructions: Apply Circaid Juxta Lite Compression Wrap daily as instructed. Apply first thing in the morning, remove at night before bed. Wound #2 - Lower Leg Wound Laterality: Left, Lateral, Proximal Cleanser:  Soap and Water 1 x Per Week/30 Days Discharge Instructions: May shower and wash wound with dial antibacterial soap and water prior to dressing change. Cleanser: Wound Cleanser 1 x Per Week/30 Days Discharge Instructions: Cleanse the wound with wound cleanser prior to applying a clean dressing using gauze sponges, not  tissue or cotton balls. Peri-Wound Care: Sween Lotion (Moisturizing lotion) 1 x Per Week/30 Days Discharge Instructions: Apply moisturizing lotion as directed Prim Dressing: Sorbalgon AG Dressing 2x2 (in/in) 1 x Per Week/30 Days ary Discharge Instructions: Apply to wound bed as instructed Secondary Dressing: Woven Gauze Sponge, Non-Sterile 4x4 in 1 x Per Week/30 Days Discharge Instructions: Apply over primary dressing as directed. Compression Wrap: ThreePress (3 layer compression wrap) 1 x Per Week/30 Days Discharge Instructions: Apply three layer compression as directed. Wound #3 - Lower Leg Wound Laterality: Right, Anterior Cleanser: Soap and Water 1 x Per Week/30 Days Discharge Instructions: May shower and wash wound with dial antibacterial soap and water prior to dressing change. Cleanser: Wound Cleanser 1 x Per Week/30 Days Discharge Instructions: Cleanse the wound with wound cleanser prior to applying a clean dressing using gauze sponges, not tissue or cotton balls. Peri-Wound Care: Sween Lotion (Moisturizing lotion) 1 x Per Week/30 Days Discharge Instructions: Apply moisturizing lotion as directed Prim Dressing: Sorbalgon AG Dressing 2x2 (in/in) 1 x Per Week/30 Days ary Discharge Instructions: Apply to wound bed as instructed [name], [name] ([id number]) [id number] Page 6 of 13 Secondary Dressing: Woven Gauze Sponge, Non-Sterile 4x4 in 1 x Per Week/30 Days Discharge Instructions: Apply over primary dressing as directed. Compression Wrap: ThreePress (3 layer compression wrap) (Generic) 1 x Per Week/30 Days Discharge Instructions: Apply three layer compression as directed. Compression Stockings: Circaid Juxta Lite Compression Wrap (DME) Right Leg Compression Amount: 30-40 mmHG Discharge Instructions: Apply Circaid Juxta Lite Compression Wrap daily as instructed. Apply first thing in the morning, remove at night before bed. Wound #4 - T Third oe Wound  Laterality: Right, Lateral Cleanser: Soap and Water 1 x Per Day/15 Days Discharge Instructions: May shower and wash wound with dial antibacterial soap and water prior to dressing change. Cleanser: Wound Cleanser 1 x Per Day/15 Days Discharge Instructions: Cleanse the wound with wound cleanser prior to applying a clean dressing using gauze sponges, not tissue or cotton balls. Prim Dressing: Sorbalgon AG Dressing 2x2 (in/in) (DME) (Generic) 1 x Per Day/15 Days ary Discharge Instructions: Apply to wound bed as instructed Secondary Dressing: Woven Gauze Sponges 2x2 in 1 x Per Day/15 Days Discharge Instructions: Apply over primary dressing as directed. Secured With: Child psychotherapist, Sterile 2x75 (in/in) (DME) (Generic) 1 x Per Day/15 Days Discharge Instructions: Secure with stretch gauze as directed. Secured With: 66M Medipore Public affairs consultant Surgical T 2x10 (in/yd) (DME) (Generic) 1 x Per Day/15 Days ape Discharge Instructions: Secure with tape as directed. Patient Medications llergies: clindamycin, codeine, Panlor (hydrocodone-acetamin), nitrofurantoin, oxycodone, amoxicillin A Notifications Medication Indication Start End 03/21/2022 lidocaine DOSE topical 4 % cream - cream topical Electronic Signature(s) Signed: 03/22/2022 11:38:38 AM By: Fredirick Maudlin MD FACS Signed: 03/22/2022 4:21:22 PM By: [name] Previous Signature: 03/20/2022 5:01:55 PM Version By: Fredirick Maudlin MD FACS Entered By: [name] on 03/22/2022 11:27:05 -------------------------------------------------------------------------------- Problem List Details Patient Name: Date of Service: [name], [name] 03/20/2022 12:30 PM Medical Record Number: [id number] Patient Account Number: [id number] Date of Birth/Sex: Treating RN: 06-15-1930 (89 y.o. F) Primary Care Provider: [name] Other Clinician: Referring Provider: Treating Provider/Extender: Jadene Pierini in Treatment:  0 Active  Problems ICD-10 Encounter Code Description Active Date MDM Diagnosis L97.811 Non-pressure chronic ulcer of other part of right lower leg limited to breakdown 03/20/2022 No Yes of skin L97.822 Non-pressure chronic ulcer of other part of left lower leg with fat layer exposed2/19/2024 No Yes L97.511 Non-pressure chronic ulcer of other part of right foot limited to breakdown of 03/20/2022 No Yes skin [name], [name] ([id number]) [id number] Page 7 of 13 R60.0 Localized edema 03/20/2022 No Yes I87.2 Venous insufficiency (chronic) (peripheral) 03/20/2022 No Yes Z79.52 Long term (current) use of systemic steroids 03/20/2022 No Yes Inactive Problems Resolved Problems Electronic Signature(s) Signed: 03/20/2022 1:50:12 PM By: Fredirick Maudlin MD FACS Previous Signature: 03/20/2022 12:47:14 PM Version By: Fredirick Maudlin MD FACS Entered By: Fredirick Maudlin on 03/20/2022 13:50:12 -------------------------------------------------------------------------------- Progress Note Details Patient Name: Date of Service: [name], [name] 03/20/2022 12:30 PM Medical Record Number: [id number] Patient Account Number: [id number] Date of Birth/Sex: Treating RN: 12-21-1930 (89 y.o. F) Primary Care Provider: [name] Other Clinician: Referring Provider: Treating Provider/Extender: Jadene Pierini in Treatment: 0 Subjective Chief Complaint Information obtained from Patient Patient presents for treatment of open ulcers due to venous insufficiency History of Present Illness (HPI) ADMISSION 03/20/2022 This is an 89 year old woman with coronary artery disease, on chronic corticosteroids for osteoarthritis, who presents with bilateral lower leg ulcers. They have been present for a couple of weeks. I do not have any vascular studies available to discern whether or not she has had venous reflux or arterial studies done. ABIs in clinic were 1.34 on the left and 0.95 on the  right. She is not diabetic. She does not smoke. Her legs are quite edematous and she has multiple hematomas of various sizes and ages extending up to at least the distal thigh. She has 3 ulcers on the left leg with slough and eschar accumulation. On the right leg, she has a tiny superficial ulcer on the anterior tibial surface. She also has a small ulcer on the lateral aspect of her right third toe. Patient History Information obtained from Patient. Allergies clindamycin, codeine, Panlor (hydrocodone-acetamin), nitrofurantoin, oxycodone, amoxicillin Family History Heart Disease - Father, Hypertension - Mother, No family history of Cancer, Diabetes, Hereditary Spherocytosis, Kidney Disease, Lung Disease, Seizures, Stroke, Thyroid Problems, Tuberculosis. Social History Never smoker, Marital Status - Married, Alcohol Use - Rarely, Drug Use - No History, Caffeine Use - Moderate. Medical History Eyes Patient has history of Cataracts Hematologic/Lymphatic Patient has history of Lymphedema Respiratory Patient has history of Sleep Apnea Cardiovascular Patient has history of Coronary Artery Disease, Hypertension Gastrointestinal Patient has history of Hepatitis B - 50+ yrs ago Musculoskeletal Patient has history of Osteoarthritis [place], [place] ([id number]) [id number] Page 8 of 13 Hospitalization/Surgery History - abdominal hysterectomy. - cardiac catheterization. - cataract extraction bilat.. - colonoscopy. - bilateral total knee replacement. - upper GI endoscopy. Medical A Surgical History Notes nd Gastrointestinal diverticulosis, GERD Musculoskeletal osteoporosis Review of Systems (ROS) Constitutional Symptoms (General Health) Denies complaints or symptoms of Fatigue, Fever, Chills, Marked Weight Change. Ear/Nose/Mouth/Throat Denies complaints or symptoms of Chronic sinus problems or rhinitis. Endocrine Denies complaints or symptoms of Heat/cold  intolerance. Genitourinary Denies complaints or symptoms of Frequent urination. Integumentary (Skin) Complains or has symptoms of Wounds. Neurologic Denies complaints or symptoms of Numbness/parasthesias. Psychiatric Denies complaints or symptoms of Claustrophobia. Objective Constitutional No acute distress. Vitals Time Taken: 12:46 PM, Temperature: 97.9 F, Pulse: 73 bpm, Respiratory Rate: 18 breaths/min, Blood Pressure: 128/73 mmHg. Respiratory Normal work of breathing on room air. Cardiovascular 2+ pitting edema  bilaterally. General Notes: 03/20/2022: Her legs are quite edematous and she has multiple hematomas of various sizes and ages extending up to at least the distal thigh. She has 3 ulcers on the left leg with slough and eschar accumulation. On the right leg, she has a tiny superficial ulcer on the anterior tibial surface. She also has a small ulcer on the lateral aspect of her right third toe. Integumentary (Hair, Skin) Wound #1 status is Open. Original cause of wound was Gradually Appeared. The date acquired was: 03/02/2022. The wound is located on the Left,Distal,Lateral Lower Leg. The wound measures 1.2cm length x 0.5cm width x 0.2cm depth; 0.471cm^2 area and 0.094cm^3 volume. There is Fat Layer (Subcutaneous Tissue) exposed. There is no tunneling or undermining noted. There is a medium amount of serosanguineous drainage noted. The wound margin is distinct with the outline attached to the wound base. There is small (1-33%) red granulation within the wound bed. There is a large (67-100%) amount of necrotic tissue within the wound bed including Eschar. The periwound skin appearance had no abnormalities noted for moisture. The periwound skin appearance exhibited: Scarring, Hemosiderin Staining, Rubor. Periwound temperature was noted as No Abnormality. Wound #2 status is Open. Original cause of wound was Gradually Appeared. The date acquired was: 03/02/2022. The wound is located on  the Left,Proximal,Lateral Lower Leg. The wound measures 2cm length x 2cm width x 0.1cm depth; 3.142cm^2 area and 0.314cm^3 volume. There is Fat Layer (Subcutaneous Tissue) exposed. There is no tunneling or undermining noted. There is a medium amount of serosanguineous drainage noted. The wound margin is distinct with the outline attached to the wound base. There is small (1-33%) red granulation within the wound bed. There is a large (67-100%) amount of necrotic tissue within the wound bed including Eschar. The periwound skin appearance had no abnormalities noted for texture. The periwound skin appearance had no abnormalities noted for moisture. The periwound skin appearance exhibited: Hemosiderin Staining, Rubor. Periwound temperature was noted as No Abnormality. Wound #3 status is Open. Original cause of wound was Gradually Appeared. The date acquired was: 03/02/2022. The wound is located on the Right,Anterior Lower Leg. The wound measures 0.3cm length x 0.3cm width x 0.1cm depth; 0.071cm^2 area and 0.007cm^3 volume. There is Fat Layer (Subcutaneous Tissue) exposed. There is no tunneling or undermining noted. There is a medium amount of serosanguineous drainage noted. The wound margin is distinct with the outline attached to the wound base. There is large (67-100%) red granulation within the wound bed. There is a small (1-33%) amount of necrotic tissue within the wound bed including Eschar and Adherent Slough. The periwound skin appearance had no abnormalities noted for texture. The periwound skin appearance had no abnormalities noted for moisture. The periwound skin appearance exhibited: Hemosiderin Staining, Rubor. Periwound temperature was noted as No Abnormality. Wound #4 status is Open. Original cause of wound was Gradually Appeared. The date acquired was: 03/20/2022. The wound is located on the Right,Lateral T oe Third. The wound measures 0.2cm length x 0.2cm width x 0.1cm depth; 0.031cm^2 area  and 0.003cm^3 volume. There is Fat Layer (Subcutaneous Tissue) exposed. There is no tunneling or undermining noted. There is a medium amount of serosanguineous drainage noted. The wound margin is distinct with the outline attached to the wound base. There is large (67-100%) red granulation within the wound bed. There is no necrotic tissue within the wound bed. The periwound skin appearance had no abnormalities noted for color. The periwound skin appearance exhibited: Callus, Dry/Scaly. Periwound temperature  was noted as No Abnormality. 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 There was a Excisional Skin/Subcutaneous Tissue Debridement with a total area of 0.6 sq cm performed by Fredirick Maudlin, MD. With the following instrument(s): Curette to remove Non-Viable tissue/material. Material removed includes Subcutaneous Tissue and Slough and after achieving pain control using Lidocaine 4% T opical Solution. No specimens were taken. A time out was conducted at 13:23, prior to the start of the procedure. A Minimum amount of bleeding was controlled with Pressure. The procedure was tolerated well. Post Debridement Measurements: 1.2cm length x 0.5cm width x 0.2cm depth; 0.094cm^3 volume. Character of Wound/Ulcer Post Debridement is improved. Post procedure Diagnosis Wound #1: Same as Pre-Procedure General Notes: scribed for Dr. Celine Ahr by [name],  RN. Wound #2 Pre-procedure diagnosis of Wound #2 is a Lymphedema located on the Left,Proximal,Lateral Lower Leg . There was a Excisional Skin/Subcutaneous Tissue Debridement with a total area of 4 sq cm performed by Fredirick Maudlin, MD. With the following instrument(s): Curette to remove Non-Viable tissue/material. Material removed includes Eschar, Subcutaneous Tissue, and Slough after achieving pain control using Lidocaine 4% T opical Solution. No specimens were taken. A time out was conducted at 13:23, prior to the start of the procedure. A Minimum amount of bleeding was controlled with Pressure. The procedure was tolerated well. Post Debridement Measurements: 2cm length x 2cm width x 0.1cm depth; 0.314cm^3 volume. Character of Wound/Ulcer Post Debridement is improved. Post procedure Diagnosis Wound #2: Same as Pre-Procedure General Notes: scribed for Dr. Celine Ahr by [name], RN. Wound #3 Pre-procedure diagnosis of Wound #3 is a Lymphedema located on the Right,Anterior Lower Leg . There was a Selective/Open Wound Non-Viable Tissue Debridement with a total area of 0.09 sq cm performed by Fredirick Maudlin, MD. With the following instrument(s): Curette to remove Non-Viable tissue/material. Material removed includes Eschar and Slough and after achieving pain control using Lidocaine 4% Topical Solution. No specimens were taken. A time out was conducted at 13:23, prior to the start of the procedure. A Minimum amount of bleeding was controlled with Pressure. The procedure was tolerated well. Post Debridement Measurements: 0.3cm length x 0.3cm width x 0.1cm depth; 0.007cm^3 volume. Character of Wound/Ulcer Post Debridement is improved. Post procedure Diagnosis Wound #3: Same as Pre-Procedure General Notes: scribed for Dr. Celine Ahr by [name], RN. Wound #4 Pre-procedure diagnosis of Wound #4 is a Pressure Ulcer located on the Right T Second . There was a Selective/Open Wound Non-Viable  Tissue oe Debridement with a total area of 0.04 sq cm performed by Fredirick Maudlin, MD. With the following instrument(s): Curette to remove Non-Viable tissue/material. Material removed includes Callus and Slough and after achieving pain control using Lidocaine 4% Topical Solution. No specimens were taken. A time out was conducted at 13:23, prior to the start of the procedure. A Minimum amount of bleeding was controlled with Pressure. The procedure was tolerated well. Post Debridement Measurements: 0.2cm length x 0.2cm width x 0.1cm depth; 0.003cm^3 volume. Post debridement Stage noted as Category/Stage II. Character of Wound/Ulcer Post Debridement is  improved. Post procedure Diagnosis Wound #4: Same as Pre-Procedure General Notes: scribed for Dr. Celine Ahr by [name], RN. Plan Follow-up Appointments: Return Appointment in 1 week. - Dr. Celine Ahr - room 2 Anesthetic: (In clinic) Topical Lidocaine 4% applied to wound bed Bathing/ Shower/ Hygiene: May shower with protection but do not get wound dressing(s) wet. Protect dressing(s) with water repellant cover (for example, large plastic bag) or a cast cover and may then take shower. Edema Control - Lymphedema / SCD / Other: Elevate legs to the level of the heart or above for 30 minutes daily and/or when sitting for 3-4 times a day throughout the day. Avoid standing for long periods of time. The following medication(s) was prescribed: lidocaine topical 4 % cream cream topical was prescribed at facility WOUND #1: - Lower Leg Wound Laterality: Left, Lateral, Distal Cleanser: Soap and Water 1 x Per Week/30 Days Discharge Instructions: May shower and wash wound with dial antibacterial soap and water prior to dressing change. Cleanser: Wound Cleanser 1 x Per Week/30 Days Discharge Instructions: Cleanse the wound with wound cleanser prior to applying a clean dressing using gauze sponges, not tissue or cotton balls. Peri-Wound Care: Sween Lotion  (Moisturizing lotion) 1 x Per Week/30 Days Discharge Instructions: Apply moisturizing lotion as directed Prim Dressing: Sorbalgon AG Dressing 2x2 (in/in) 1 x Per Week/30 Days ary Discharge Instructions: Apply to wound bed as instructed Secondary Dressing: Woven Gauze Sponge, Non-Sterile 4x4 in 1 x Per Week/30 Days Discharge Instructions: Apply over primary dressing as directed. Com pression Wrap: ThreePress (3 layer compression wrap) 1 x Per Week/30 Days Discharge Instructions: Apply three layer compression as directed. Com pression Stockings: Circaid Juxta Lite Compression Wrap (DME) Compression Amount: 30-40 mmHg (left) Discharge Instructions: Apply Circaid Juxta Lite Compression Wrap daily as instructed. Apply first thing in the morning, remove at night before [name], [name] ([id number]) [id number] Page 10 of 13 bed. WOUND #2: - Lower Leg Wound Laterality: Left, Lateral, Proximal Cleanser: Soap and Water 1 x Per Week/30 Days Discharge Instructions: May shower and wash wound with dial antibacterial soap and water prior to dressing change. Cleanser: Wound Cleanser 1 x Per Week/30 Days Discharge Instructions: Cleanse the wound with wound cleanser prior to applying a clean dressing using gauze sponges, not tissue or cotton balls. Peri-Wound Care: Sween Lotion (Moisturizing lotion) 1 x Per Week/30 Days Discharge Instructions: Apply moisturizing lotion as directed Prim Dressing: Sorbalgon AG Dressing 2x2 (in/in) 1 x Per Week/30 Days ary Discharge Instructions: Apply to wound bed as instructed Secondary Dressing: Woven Gauze Sponge, Non-Sterile 4x4 in 1 x Per Week/30 Days Discharge Instructions: Apply over primary dressing as directed. Com pression Wrap: ThreePress (3 layer compression wrap) 1 x Per Week/30 Days Discharge Instructions: Apply three layer compression as directed. WOUND #3: - Lower Leg Wound Laterality: Right, Anterior Cleanser: Soap and Water 1 x  Per Week/30 Days Discharge Instructions: May shower and wash wound with dial antibacterial soap and water prior to dressing change. Cleanser: Wound Cleanser 1 x Per Week/30 Days Discharge Instructions: Cleanse the wound with wound cleanser prior to applying a clean dressing using gauze sponges, not tissue or cotton balls. Peri-Wound Care: Sween Lotion (Moisturizing lotion) 1 x Per Week/30 Days Discharge Instructions: Apply moisturizing lotion as directed Prim Dressing: Sorbalgon AG Dressing 2x2 (in/in) 1 x Per Week/30 Days ary Discharge Instructions: Apply to wound bed as instructed Secondary Dressing: Woven Gauze Sponge, Non-Sterile 4x4 in 1 x Per Week/30 Days Discharge Instructions: Apply over primary dressing as directed. Com pression  Wrap: ThreePress (3 layer compression wrap) (Generic) 1 x Per Week/30 Days Discharge Instructions: Apply three layer compression as directed. Com pression Stockings: Circaid Juxta Lite Compression Wrap (DME) Compression Amount: 30-40 mmHg (right) Discharge Instructions: Apply Circaid Juxta Lite Compression Wrap daily as instructed. Apply first thing in the morning, remove at night before bed. WOUND #4: - T Third Wound Laterality: Right, Lateral oe Cleanser: Soap and Water 1 x Per Day/15 Days Discharge Instructions: May shower and wash wound with dial antibacterial soap and water prior to dressing change. Cleanser: Wound Cleanser 1 x Per Day/15 Days Discharge Instructions: Cleanse the wound with wound cleanser prior to applying a clean dressing using gauze sponges, not tissue or cotton balls. Prim Dressing: Sorbalgon AG Dressing 2x2 (in/in) (DME) (Generic) 1 x Per Day/15 Days ary Discharge Instructions: Apply to wound bed as instructed Secondary Dressing: Woven Gauze Sponges 2x2 in 1 x Per Day/15 Days Discharge Instructions: Apply over primary dressing as directed. Secured With: Child psychotherapist, Sterile 2x75 (in/in) (DME) (Generic) 1 x Per  Day/15 Days Discharge Instructions: Secure with stretch gauze as directed. Secured With: 26M Medipore Public affairs consultant Surgical T 2x10 (in/yd) (DME) (Generic) 1 x Per Day/15 Days ape Discharge Instructions: Secure with tape as directed. 03/20/2022: Her legs are quite edematous and she has multiple hematomas of various sizes and ages extending up to at least the distal thigh. She has 3 ulcers on the left leg with slough and eschar accumulation. On the right leg, she has a tiny superficial ulcer on the anterior tibial surface. She also has a small ulcer on the lateral aspect of her right third toe. I used a curette to debride slough and nonviable subcutaneous tissue from the more distal of the left leg ulcers; I debrided slough eschar and nonviable subcutaneous tissue from the 2 more proximal ones. I debrided eschar off of the right anterior tibial ulcer and eschar from the ulcer on her lateral third toe. We will apply silver alginate to all of the wound sites and bilateral 3 layer compression. Follow-up in 1 week. Electronic Signature(s) Signed: 03/24/2022 10:39:08 AM By: Fredirick Maudlin MD FACS Signed: 03/24/2022 3:17:57 PM By: [name] Previous Signature: 03/22/2022 11:38:38 AM Version By: Fredirick Maudlin MD FACS Previous Signature: 03/22/2022 4:21:22 PM Version By: [name] Previous Signature: 03/20/2022 2:01:29 PM Version By: Fredirick Maudlin MD FACS Entered By: [name] on 03/24/2022 09:57:31 -------------------------------------------------------------------------------- HxROS Details Patient Name: Date of Service: [name], [name] 03/20/2022 12:30 PM Medical Record Number: [id number] Patient Account Number: [id number] Date of Birth/Sex: Treating RN: December 14, 1930 (89 y.o. [name] Primary Care Provider: [name] Other Clinician: Referring Provider: Treating Provider/Extender: Jadene Pierini in Treatment: 0 Information Obtained  From Patient Constitutional Symptoms (General Health) Complaints and Symptoms: Negative for: Fatigue; Fever; Chills; Marked Weight Change AMETHYST, RUNSER ([id number]) [id number] Page 11 of 13 Ear/Nose/Mouth/Throat Complaints and Symptoms: Negative for: Chronic sinus problems or rhinitis Endocrine Complaints and Symptoms: Negative for: Heat/cold intolerance Genitourinary Complaints and Symptoms: Negative for: Frequent urination Integumentary (Skin) Complaints and Symptoms: Positive for: Wounds Neurologic Complaints and Symptoms: Negative for: Numbness/parasthesias Psychiatric Complaints and Symptoms: Negative for: Claustrophobia Eyes Medical History: Positive for: Cataracts Hematologic/Lymphatic Medical History: Positive for: Lymphedema Respiratory Medical History: Positive for: Sleep Apnea Cardiovascular Medical History: Positive for: Coronary Artery Disease; Hypertension Gastrointestinal Medical History: Positive for: Hepatitis B - 50+ yrs ago Past Medical History Notes: diverticulosis, GERD Immunological Musculoskeletal Medical History: Positive for: Osteoarthritis Past Medical History Notes: osteoporosis Oncologic HBO  Extended History Items Eyes: Cataracts Immunizations Pneumococcal Vaccine: Received Pneumococcal Vaccination: Yes Received Pneumococcal Vaccination On or After [address]Audreyana [name], [name] ([id number]) [id number] Page 12 of 13 Implantable Devices No devices added Hospitalization / Surgery History Type of Hospitalization/Surgery abdominal hysterectomy cardiac catheterization cataract extraction bilat. colonoscopy bilateral total knee replacement upper GI endoscopy Family and Social History Cancer: No; Diabetes: No; Heart Disease: Yes - Father; Hereditary Spherocytosis: No; Hypertension: Yes - Mother; Kidney Disease: No; Lung Disease: No; Seizures: No; Stroke: No; Thyroid  Problems: No; Tuberculosis: No; Never smoker; Marital Status - Married; Alcohol Use: Rarely; Drug Use: No History; Caffeine Use: Moderate; Financial Concerns: No; Food, Clothing or Shelter Needs: No; Support System Lacking: No; Transportation Concerns: No Electronic Signature(s) Signed: 03/20/2022 4:41:15 PM By: [name] Signed: 03/20/2022 5:01:55 PM By: Fredirick Maudlin MD FACS Entered By: [name] on 03/20/2022 12:50:21 -------------------------------------------------------------------------------- SuperBill Details Patient Name: Date of Service: [name], [name] 03/20/2022 Medical Record Number: [id number] Patient Account Number: [id number] Date of Birth/Sex: Treating RN: 02-Sep-1930 (89 y.o. [name] Primary Care Provider: [name] Other Clinician: Referring Provider: Treating Provider/Extender: Jadene Pierini in Treatment: 0 Diagnosis Coding ICD-10 Codes Code Description [phone] Non-pressure chronic ulcer of other part of right lower leg limited to breakdown of skin L97.822 Non-pressure chronic ulcer of other part of left lower leg with fat layer exposed L97.511 Non-pressure chronic ulcer of other part of right foot limited to breakdown of skin R60.0 Localized edema I87.2 Venous insufficiency (chronic) (peripheral) Z79.52 Long term (current) use of systemic steroids Facility Procedures : CPT4 Code: [id number] Description: 9213 - WOUND CARE VISIT-LEV 3 EST PT Modifier: 25 Quantity: 1 : CPT4 Code: [id number] Description: [id number] - DEB SUBQ TISSUE 20 SQ CM/< ICD-10 Diagnosis Description L97.822 Non-pressure chronic ulcer of other part of left lower leg with fat layer exposed Modifier: Quantity: 1 : CPT4 Code: [id number] Description: 7597 - DEBRIDE WOUND 1ST 20 SQ CM OR < ICD-10 Diagnosis Description L97.811 Non-pressure chronic ulcer of other part of right lower leg limited to breakdown of L97.511 Non-pressure chronic ulcer of other  part of right foot limited to  breakdown of skin Modifier: skin Quantity: 1 Physician Procedures : CPT4 Code Description Modifier [id number] - WC PHYS LEVEL 4 - NEW PT 25 ICD-10 Diagnosis Description L97.811 Non-pressure chronic ulcer of other part of right lower leg limited to breakdown of skin L97.822 Non-pressure chronic ulcer of other part of  left lower leg with fat layer exposed L97.511 Non-pressure chronic ulcer of other part of right foot limited to breakdown of skin I87.2 Venous insufficiency (chronic) (peripheral) Quantity: 1 : [id number] - WC PHYS SUBQ TISS 20 SQ CM [name], [name] ([id number]) [id number]. ICD-10 Diagnosis Description L97.822 Non-pressure chronic ulcer of other part of left lower leg with fat layer exposed Quantity: 1 pdf Page 13 of 13 : [id number] - WC PHYS DEBR WO ANESTH 20 SQ CM 1 ICD-10 Diagnosis Description L97.811 Non-pressure chronic ulcer of other part of right lower leg limited to breakdown of skin L97.511 Non-pressure chronic ulcer of other part of right foot limited to  breakdown of skin Quantity: Electronic Signature(s) Signed: 03/20/2022 2:02:14 PM By: Fredirick Maudlin MD FACS Entered By: Fredirick Maudlin on 03/20/2022 14:02:14

## 2022-03-21 NOTE — Progress Notes (Addendum)
KAROL, SEGAWA ([id number]) [id number] Page 1 of 12 Visit Report for 03/20/2022 Allergy List Details Patient Name: Date of Service: [name], [name] 03/20/2022 12:30 PM Medical Record Number: [id number] Patient Account Number: [id number] Date of Birth/Sex: Treating RN: 1930-04-30 (89 y.o. [name] Primary Care Marylee Belzer: [name] Other Clinician: Referring Aradhana Gin: Treating Evyn Putzier/Extender: Jadene Pierini in Treatment: 0 Allergies Active Allergies clindamycin codeine Panlor (hydrocodone-acetamin) nitrofurantoin oxycodone amoxicillin Allergy Notes Electronic Signature(s) Signed: 03/20/2022 4:41:15 PM By: [name] Entered By: [name] on 03/20/2022 12:47:13 -------------------------------------------------------------------------------- Arrival Information Details Patient Name: Date of Service: [name], [name] 03/20/2022 12:30 PM Medical Record Number: [id number] Patient Account Number: [id number] Date of Birth/Sex: Treating RN: [birth year] (89 y.o. [name] Primary Care Donya Tomaro: [name] Other Clinician: Referring Dresden Ament: Treating Chantalle Defilippo/Extender: Jadene Pierini in Treatment: 0 Visit Information Patient Arrived: Charlyn Minerva Time: 12:42 Accompanied By: husband Transfer Assistance: Manual Patient Identification Verified: Yes Secondary Verification Process Completed: Yes Patient Has Alerts: No Electronic Signature(s) Signed: 03/20/2022 4:41:15 PM By: [name] Entered By: [name] on 03/20/2022 12:46:28 -------------------------------------------------------------------------------- Clinic Level of Care Assessment Details Patient Name: Date of Service: [name], [name] 03/20/2022 12:30 PM Medical Record Number: [id number] Patient Account Number: [id number] Date of Birth/Sex: Treating RN: [birth year] (89 y.o. [name] Primary Care  Saman Umstead: [name] Other Clinician: Referring Herald Vallin: Treating Tahjae Durr/Extender: Jadene Pierini in Treatment: [place], [place] ([id number]) [id number] Page 2 of 12 Clinic Level of Care Assessment Items TOOL 1 Quantity Score X- 1 0 Use when EandM and Procedure is performed on INITIAL visit ASSESSMENTS - Nursing Assessment / Reassessment X- 1 20 General Physical Exam (combine w/ comprehensive assessment (listed just below) when performed on new pt. evals) X- 1 25 Comprehensive Assessment (HX, ROS, Risk Assessments, Wounds Hx, etc.) ASSESSMENTS - Wound and Skin Assessment / Reassessment '[]'$  - 0 Dermatologic / Skin Assessment (not related to wound area) ASSESSMENTS - Ostomy and/or Continence Assessment and Care '[]'$  - 0 Incontinence Assessment and Management '[]'$  - 0 Ostomy Care Assessment and Management (repouching, etc.) PROCESS - Coordination of Care X - Simple Patient / Family Education for ongoing care 1 15 '[]'$  - 0 Complex (extensive) Patient / Family Education for ongoing care X- 1 10 Staff obtains Programmer, systems, Records, T Results / Process Orders est '[]'$  - 0 Staff telephones HHA, Nursing Homes / Clarify orders / etc '[]'$  - 0 Routine Transfer to another Facility (non-emergent condition) '[]'$  - 0 Routine Hospital Admission (non-emergent condition) X- 1 15 New Admissions / Biomedical engineer / Ordering NPWT Apligraf, etc. , '[]'$  - 0 Emergency Hospital Admission (emergent condition) PROCESS - Special Needs '[]'$  - 0 Pediatric / Minor Patient Management '[]'$  - 0 Isolation Patient Management '[]'$  - 0 Hearing / Language / Visual special needs '[]'$  - 0 Assessment of Community assistance (transportation, D/C planning, etc.) '[]'$  - 0 Additional assistance / Altered mentation '[]'$  - 0 Support Surface(s) Assessment (bed, cushion, seat, etc.) INTERVENTIONS - Miscellaneous '[]'$  - 0 External ear exam '[]'$  - 0 Patient Transfer (multiple staff / Civil Service fast streamer /  Similar devices) '[]'$  - 0 Simple Staple / Suture removal (25 or less) '[]'$  - 0 Complex Staple / Suture removal (26 or more) '[]'$  - 0 Hypo/Hyperglycemic Management (do not check if billed separately) X- 1 15 Ankle / Brachial Index (ABI) - do not check if billed separately Has the patient been seen at the hospital within the last three years: Yes Total Score: 100 Level Of  Care: New/Established - Level 3 Electronic Signature(s) Signed: 03/20/2022 4:41:15 PM By: [name] Entered By: [name] on 03/20/2022 13:18:28 -------------------------------------------------------------------------------- Encounter Discharge Information Details Patient Name: Date of Service: [name], [name] 03/20/2022 12:30 PM Medical Record Number: [id number] Patient Account Number: [id number] Date of Birth/Sex: Treating RN: [birth year] (89 y.o. [name] Primary Care Yazmyn Valbuena: [name] Other Clinician: Referring Saidah Kempton: Treating Kamau Weatherall/Extender: Jadene Pierini in Treatment: [place], [place] ([id number]) [id number] Page 3 of 12 Encounter Discharge Information Items Post Procedure Vitals Discharge Condition: Stable Temperature (F): 97.9 Ambulatory Status: Walker Pulse (bpm): 73 Discharge Destination: Home Respiratory Rate (breaths/min): 18 Transportation: Private Auto Blood Pressure (mmHg): 128/73 Accompanied By: husband Schedule Follow-up Appointment: Yes Clinical Summary of Care: Patient Declined Electronic Signature(s) Signed: 03/20/2022 4:41:15 PM By: [name] Entered By: [name] on 03/20/2022 13:58:24 -------------------------------------------------------------------------------- Lower Extremity Assessment Details Patient Name: Date of Service: [name], [name] 03/20/2022 12:30 PM Medical Record Number: [id number] Patient Account Number: [id number] Date of Birth/Sex: Treating RN: [birth year] (89 y.o. [name] Primary Care Dorothe Elmore: [name] Other Clinician: Referring Sukhdeep Wieting: Treating Tashi Andujo/Extender: Jadene Pierini in Treatment: 0 Edema Assessment Assessed: [Left: No] [Right: No] [Left: Edema] [Right: :] Calf Left: Right: Point of Measurement: From Medial Instep 35.6 cm 36 cm Ankle Left: Right: Point of Measurement: From Medial Instep 23.4 cm 23 cm Knee To Floor Left: Right: From Medial Instep 40 cm 40 cm Vascular Assessment Pulses: Dorsalis Pedis Palpable: [Left:No] [Right:No] Blood Pressure: Brachial: [Left:128] [Right:128] Ankle: [Left:Dorsalis Pedis: 172 1.34] [Right:Dorsalis Pedis: 122 0.95] Electronic Signature(s) Signed: 03/20/2022 4:41:15 PM By: [name] Entered By: [name] on 03/20/2022 12:59:51 -------------------------------------------------------------------------------- Multi Wound Chart Details Patient Name: Date of Service: [name], [name] 03/20/2022 12:30 PM Medical Record Number: [id number] Patient Account Number: [id number] Date of Birth/Sex: Treating RN: September 21, 1930 (89 y.o. F) Primary Care Emmerie Battaglia: [name] Other Clinician: Referring Jerrit Horen: Treating Arissa Fagin/Extender: Jadene Pierini in Treatment: 0 Vital Signs Height(in): Pulse(bpm): 73 Weight(lbs): Blood Pressure(mmHg): 128/73 LIDIE, JARAMILLO ([id number]) [id number] Page 4 of 12 Body Mass Index(BMI): Temperature(F): 97.9 Respiratory Rate(breaths/min): 18 [1:Photos: No Photos Left, Lateral Lower Leg Wound Location: Gradually Appeared Wounding Event: Lymphedema Primary Etiology: Cataracts, Lymphedema, Sleep Comorbid History: Apnea, Coronary Artery Disease, Hypertension, Hepatitis B, Osteoarthritis 03/02/2022 Date  Acquired: 0 Weeks of Treatment: Open Wound Status: No Wound Recurrence: 1.2x0.5x0.2 Measurements L x W x D (cm) 0.471 A (cm) : rea 0.094 Volume (cm) : Full Thickness Without Exposed  Classification: Support Structures Medium Exudate A mount:  Serosanguineous Exudate Type: red, brown Exudate Color: Distinct, outline attached Wound Margin: Small (1-33%) Granulation A mount: Red Granulation Quality: Large (67-100%) Necrotic A mount: Eschar Necrotic Tissue: Fat Layer (Subcutaneous Tissue): Yes  Fat Layer (Subcutaneous Tissue): Yes Fat Layer (Subcutaneous Tissue): Yes Exposed Structures: Fascia: No Tendon: No Muscle: No Joint: No Bone: No Small (1-33%) Epithelialization: Debridement - Excisional Debridement: Pre-procedure Verification/Time Out  13:23 Taken: Lidocaine 4% Topical Solution Pain Control: Subcutaneous, Slough Tissue Debrided: Skin/Subcutaneous Tissue Level: 0.6 Debridement A (sq cm): rea Curette Instrument: Minimum Bleeding: Pressure Hemostasis Achieved: Procedure was tolerated well  Debridement Treatment Response: 1.2x0.5x0.2 Post Debridement Measurements L x W x D (cm) 0.094 Post Debridement Volume: (cm) N/A Post Debridement Stage: Scarring: Yes Periwound Skin Texture: No Abnormalities Noted Periwound Skin Moisture: Hemosiderin  Staining: Yes Periwound Skin Color: Rubor: Yes No Abnormality Temperature: Debridement Procedures Performed:] [2:No Photos Right, Proximal, Lateral Lower Leg Gradually Appeared Lymphedema Cataracts, Lymphedema, Sleep Apnea, Coronary  Artery Disease,  Hypertension, Hepatitis B, Osteoarthritis 03/02/2022 0 Open No 2x2x0.1 3.142 0.314 Full Thickness Without Exposed Support Structures Medium Serosanguineous red, brown Distinct, outline attached Small (1-33%) Red Large (67-100%) Eschar, Adherent Slough  Fascia: No Tendon: No Muscle: No Joint: No Bone: No Small (1-33%) Debridement - Excisional 13:23 Lidocaine 4% Topical Solution Necrotic/Eschar, Subcutaneous, Slough Skin/Subcutaneous Tissue 4 Curette Minimum Pressure Procedure was tolerated well 2x2x0.1  0.314 N/A No Abnormalities Noted No Abnormalities Noted Hemosiderin Staining: Yes Rubor: Yes No Abnormality  Debridement] [3:No Photos Right, Anterior Lower Leg Gradually Appeared Lymphedema Cataracts, Lymphedema, Sleep Apnea, Coronary Artery Disease,  Hypertension, Hepatitis B, Osteoarthritis 03/02/2022 0 Open No 0.3x0.3x0.1 0.071 0.007 Full Thickness Without Exposed Support Structures Medium Serosanguineous red, brown Distinct, outline attached Large (67-100%) Red Small (1-33%) Eschar, Adherent Slough  Fascia: No Tendon: No Muscle: No Joint: No Bone: No Small (1-33%) Debridement - Selective/Open Wound 13:23 Lidocaine 4% Topical Solution Necrotic/Eschar, Slough Non-Viable Tissue 0.09 Curette Minimum Pressure Procedure was tolerated well 0.3x0.3x0.1  0.007 N/A No Abnormalities Noted No Abnormalities Noted Hemosiderin Staining: Yes Rubor: Yes No Abnormality Debridement] Wound Number: 4 N/A N/A Photos: No Photos N/A N/A Right, Lateral T Third oe N/A N/A Wound Location: Gradually Appeared N/A N/A Wounding Event: Pressure Ulcer N/A N/A Primary Etiology: Cataracts, Lymphedema, Sleep N/A N/A Comorbid History: Apnea, Coronary Artery Disease, Hypertension, Hepatitis B, Osteoarthritis 03/20/2022 N/A N/A Date Acquired: 0 N/A N/A Weeks of Treatment: Open N/A N/A Wound Status: No N/A N/A Wound Recurrence: 0.2x0.2x0.1 N/A N/A Measurements L x W x D (cm) 0.031 N/A N/A A (cm) : rea 0.003 N/A N/A Volume (cm) : Category/Stage II N/A N/A Classification: Medium N/A N/A Exudate A mount: Serosanguineous N/A N/A Exudate Type: red, brown N/A N/A Exudate Color: Distinct, outline attached N/A N/A Wound Margin: Large (67-100%) N/A N/A Granulation A mount: Red N/A N/A Granulation QualityCHARLSEY, TUN ([id number]) [id number] Page 5 of 12 None Present (0%) N/A N/A Necrotic Amount: N/A N/A N/A Necrotic Tissue: Fat Layer (Subcutaneous Tissue): Yes N/A N/A Exposed Structures: Fascia: No Tendon: No Muscle: No Joint: No Bone: No Small (1-33%) N/A  N/A Epithelialization: Debridement - Selective/Open Wound N/A N/A Debridement: Pre-procedure Verification/Time Out 13:23 N/A N/A Taken: Lidocaine 4% T opical Solution N/A N/A Pain Control: Callus, Slough N/A N/A Tissue Debrided: Non-Viable Tissue N/A N/A Level: 0.04 N/A N/A Debridement A (sq cm): rea Curette N/A N/A Instrument: Minimum N/A N/A Bleeding: Pressure N/A N/A Hemostasis A chieved: Procedure was tolerated well N/A N/A Debridement Treatment Response: 0.2x0.2x0.1 N/A N/A Post Debridement Measurements L x W x D (cm) 0.003 N/A N/A Post Debridement Volume: (cm) Category/Stage II N/A N/A Post Debridement Stage: Callus: Yes N/A N/A Periwound Skin Texture: Dry/Scaly: Yes N/A N/A Periwound Skin Moisture: No Abnormalities Noted N/A N/A Periwound Skin Color: No Abnormality N/A N/A Temperature: Debridement N/A N/A Procedures Performed: Treatment Notes Electronic Signature(s) Signed: 03/20/2022 1:50:30 PM By: Fredirick Maudlin MD FACS Entered By: Fredirick Maudlin on 03/20/2022 13:50:29 -------------------------------------------------------------------------------- Multi-Disciplinary Care Plan Details Patient Name: Date of Service: [name], [name] 03/20/2022 12:30 PM Medical Record Number: [id number] Patient Account Number: [id number] Date of Birth/Sex: Treating RN: Jul 27, 1930 (89 y.o. [name] Primary Care Cerrone Debold: [name] Other Clinician: Referring Eisa Necaise: Treating Ikram Riebe/Extender: Jadene Pierini in Treatment: 0 Active Inactive Abuse / Safety / Falls / Self Care Management Nursing Diagnoses: History of Falls Impaired physical mobility Potential for falls Goals: Patient will not experience any injury related to falls Date Initiated: 03/20/2022 Target Resolution Date:  05/05/2022 Goal Status: Active Patient/caregiver will demonstrate safe use of adaptive devices to increase mobility Date Initiated: 03/20/2022 Target  Resolution Date: 05/05/2022 Goal Status: Active Interventions: Assess: immobility, friction, shearing, incontinence upon admission and as needed Provide education on fall prevention Notes: Wound/Skin Impairment Nursing Diagnoses: Impaired tissue integrity JOLAINE, HAMBRIC ([id number]) [id number] Page 6 of 12 Knowledge deficit related to ulceration/compromised skin integrity Goals: Patient/caregiver will verbalize understanding of skin care regimen Date Initiated: 03/20/2022 Target Resolution Date: 05/05/2022 Goal Status: Active Interventions: Assess ulceration(s) every visit Treatment Activities: Skin care regimen initiated : 03/20/2022 Topical wound management initiated : 03/20/2022 Notes: Electronic Signature(s) Signed: 03/20/2022 4:41:15 PM By: [name] Entered By: [name] on 03/20/2022 13:17:48 -------------------------------------------------------------------------------- Pain Assessment Details Patient Name: Date of Service: [name], [name] 03/20/2022 12:30 PM Medical Record Number: [id number] Patient Account Number: [id number] Date of Birth/Sex: Treating RN: [birth year] (89 y.o. [name] Primary Care Sumaiyah Markert: [name] Other Clinician: Referring Markham Dumlao: Treating Sina Sumpter/Extender: Jadene Pierini in Treatment: 0 Active Problems Location of Pain Severity and Description of Pain Patient Has Paino No Site Locations Rate the pain. Current Pain Level: 0 Pain Management and Medication Current Pain Management: Electronic Signature(s) Signed: 03/20/2022 4:41:15 PM By: [name] Entered By: [name] on 03/20/2022 13:09:00 -------------------------------------------------------------------------------- Patient/Caregiver Education Details Patient Name: Date of Service: [name], [name] 2/19/2024andnbsp12:30 PM Medical Record Number: [id number] Patient Account Number: [id number] Date of  Birth/Gender: Treating RN: March 04, 1930 (89 y.o. [name] Primary Care Physician: [name] Other Clinician: Referring Physician: Treating Physician/Extender: Jadene Pierini in Treatment: [place], [place] ([id number]) [id number] Page 7 of 12 Education Assessment Education Provided To: Patient Education Topics Provided Welcome T [facility]New [facility] Packet: o Methods: Explain/Verbal Responses: Reinforcements needed, State content correctly Wound Debridement: Methods: Explain/Verbal Responses: Reinforcements needed, State content correctly Electronic Signature(s) Signed: 03/20/2022 4:41:15 PM By: [name] Entered By: [name] on 03/20/2022 13:18:04 -------------------------------------------------------------------------------- Wound Assessment Details Patient Name: Date of Service: [name], [name] 03/20/2022 12:30 PM Medical Record Number: [id number] Patient Account Number: [id number] Date of Birth/Sex: Treating RN: 14-Oct-1930 (89 y.o. [name] Primary Care Eliette Drumwright: [name] Other Clinician: Referring Khloi Rawl: Treating Darlina Mccaughey/Extender: Jadene Pierini in Treatment: 0 Wound Status Wound Number: 1 Primary Lymphedema Etiology: Wound Location: Left, Distal, Lateral Lower Leg Wound Open Wounding Event: Gradually Appeared Status: Date Acquired: 03/02/2022 Comorbid Cataracts, Lymphedema, Sleep Apnea, Coronary Artery Disease, Weeks Of Treatment: 0 History: Hypertension, Hepatitis B, Osteoarthritis Clustered Wound: No Wound Measurements Length: (cm) 1.2 Width: (cm) 0.5 Depth: (cm) 0.2 Area: (cm) 0.471 Volume: (cm) 0.094 % Reduction in Area: % Reduction in Volume: Epithelialization: Small (1-33%) Tunneling: No Undermining: No Wound Description Classification: Full Thickness Without Exposed Support Structures Wound Margin: Distinct, outline attached Exudate  Amount: Medium Exudate Type: Serosanguineous Exudate Color: red, brown Foul Odor After Cleansing: No Slough/Fibrino Yes Wound Bed Granulation Amount: Small (1-33%) Exposed Structure Granulation Quality: Red Fascia Exposed: No Necrotic Amount: Large (67-100%) Fat Layer (Subcutaneous Tissue) Exposed: Yes Necrotic Quality: Eschar Tendon Exposed: No Muscle Exposed: No Joint Exposed: No Bone Exposed: No Periwound Skin Texture Texture Color No Abnormalities Noted: No No Abnormalities Noted: No Scarring: Yes Hemosiderin Staining: Yes Rubor: Yes Moisture No Abnormalities Noted: Yes Temperature / Pain Temperature: No Abnormality Sitzer, Ellyanna ([id number]) [id number] Page 8 of 12 Treatment Notes Wound #1 (Lower Leg) Wound Laterality: Left, Lateral, Distal Cleanser Soap and Water Discharge Instruction: May shower and wash wound with dial antibacterial soap and water  prior to dressing change. Wound Cleanser Discharge Instruction: Cleanse the wound with wound cleanser prior to applying a clean dressing using gauze sponges, not tissue or cotton balls. Peri-Wound Care Sween Lotion (Moisturizing lotion) Discharge Instruction: Apply moisturizing lotion as directed Topical Primary Dressing Sorbalgon AG Dressing 2x2 (in/in) Discharge Instruction: Apply to wound bed as instructed Secondary Dressing Woven Gauze Sponge, Non-Sterile 4x4 in Discharge Instruction: Apply over primary dressing as directed. Secured With Compression Wrap ThreePress (3 layer compression wrap) Discharge Instruction: Apply three layer compression as directed. Compression Stockings Circaid Juxta Lite Compression Wrap Quantity: 1 Left Leg Compression Amount: 30-40 mmHg Discharge Instruction: Apply Circaid Juxta Lite Compression Wrap daily as instructed. Apply first thing in the morning, remove at night before bed. Add-Ons Electronic Signature(s) Signed: 03/24/2022 3:17:57 PM By:  [name] Previous Signature: 03/20/2022 4:41:15 PM Version By: Sabas Sous By: [name] on 03/24/2022 07:29:38 -------------------------------------------------------------------------------- Wound Assessment Details Patient Name: Date of Service: [name], [name] 03/20/2022 12:30 PM Medical Record Number: [id number] Patient Account Number: [id number] Date of Birth/Sex: Treating RN: Jan 04, 1931 (89 y.o. [name] Primary Care Yanni Quiroa: [name] Other Clinician: Referring Judyth Demarais: Treating Laury Huizar/Extender: Jadene Pierini in Treatment: 0 Wound Status Wound Number: 2 Primary Lymphedema Etiology: Wound Location: Left, Proximal, Lateral Lower Leg Wound Open Wounding Event: Gradually Appeared Status: Date Acquired: 03/02/2022 Comorbid Cataracts, Lymphedema, Sleep Apnea, Coronary Artery Disease, Weeks Of Treatment: 0 History: Hypertension, Hepatitis B, Osteoarthritis Clustered Wound: No Wound Measurements Length: (cm) 2 Width: (cm) 2 Depth: (cm) 0.1 Area: (cm) 3.142 Volume: (cm) 0.314 % Reduction in Area: % Reduction in Volume: Epithelialization: Small (1-33%) Tunneling: No Undermining: No Wound Description Classification: Full Thickness Without Exposed Support Structures Wound Margin: Distinct, outline attached Exudate Amount: Medium Dohse, Charlean ([id number]) Exudate Type: Serosanguineous Exudate Color: red, brown Foul Odor After Cleansing: No Slough/Fibrino Yes [id number] Page 9 of 12 Wound Bed Granulation Amount: Small (1-33%) Exposed Structure Granulation Quality: Red Fascia Exposed: No Necrotic Amount: Large (67-100%) Fat Layer (Subcutaneous Tissue) Exposed: Yes Necrotic Quality: Eschar Tendon Exposed: No Muscle Exposed: No Joint Exposed: No Bone Exposed: No Periwound Skin Texture Texture Color No Abnormalities Noted: Yes No Abnormalities Noted: No Hemosiderin Staining:  Yes Moisture Rubor: Yes No Abnormalities Noted: Yes Temperature / Pain Temperature: No Abnormality Treatment Notes Wound #2 (Lower Leg) Wound Laterality: Left, Lateral, Proximal Cleanser Soap and Water Discharge Instruction: May shower and wash wound with dial antibacterial soap and water prior to dressing change. Wound Cleanser Discharge Instruction: Cleanse the wound with wound cleanser prior to applying a clean dressing using gauze sponges, not tissue or cotton balls. Peri-Wound Care Sween Lotion (Moisturizing lotion) Discharge Instruction: Apply moisturizing lotion as directed Topical Primary Dressing Sorbalgon AG Dressing 2x2 (in/in) Discharge Instruction: Apply to wound bed as instructed Secondary Dressing Woven Gauze Sponge, Non-Sterile 4x4 in Discharge Instruction: Apply over primary dressing as directed. Secured With Compression Wrap ThreePress (3 layer compression wrap) Discharge Instruction: Apply three layer compression as directed. Compression Stockings Add-Ons Electronic Signature(s) Signed: 03/24/2022 3:17:57 PM By: [name] Previous Signature: 03/20/2022 4:41:15 PM Version By: Sabas Sous By: [name] on 03/24/2022 07:29:43 -------------------------------------------------------------------------------- Wound Assessment Details Patient Name: Date of Service: [name], [name] 03/20/2022 12:30 PM Medical Record Number: [id number] Patient Account Number: [id number] Date of Birth/Sex: Treating RN: 1931/01/28 (89 y.o. [name] Primary Care Bethany Hirt: [name] Other Clinician: Referring Aleksandar Duve: Treating Channin Agustin/Extender: Jadene Pierini in Treatment: 0 Wound Status Wound Number: 3 Primary Lymphedema Etiology: Wound  Location: Right, Anterior Lower Leg McDowell, Angelis ([id number]) [id number] Page 10 of 12 Wound Open Wounding Event: Gradually Appeared Status: Date Acquired:  03/02/2022 Comorbid Cataracts, Lymphedema, Sleep Apnea, Coronary Artery Disease, Weeks Of Treatment: 0 History: Hypertension, Hepatitis B, Osteoarthritis Clustered Wound: No Wound Measurements Length: (cm) 0.3 Width: (cm) 0.3 Depth: (cm) 0.1 Area: (cm) 0.071 Volume: (cm) 0.007 % Reduction in Area: % Reduction in Volume: Epithelialization: Small (1-33%) Tunneling: No Undermining: No Wound Description Classification: Full Thickness Without Exposed Support Structures Wound Margin: Distinct, outline attached Exudate Amount: Medium Exudate Type: Serosanguineous Exudate Color: red, brown Foul Odor After Cleansing: No Slough/Fibrino Yes Wound Bed Granulation Amount: Large (67-100%) Exposed Structure Granulation Quality: Red Fascia Exposed: No Necrotic Amount: Small (1-33%) Fat Layer (Subcutaneous Tissue) Exposed: Yes Necrotic Quality: Eschar, Adherent Slough Tendon Exposed: No Muscle Exposed: No Joint Exposed: No Bone Exposed: No Periwound Skin Texture Texture Color No Abnormalities Noted: Yes No Abnormalities Noted: No Hemosiderin Staining: Yes Moisture Rubor: Yes No Abnormalities Noted: Yes Temperature / Pain Temperature: No Abnormality Treatment Notes Wound #3 (Lower Leg) Wound Laterality: Right, Anterior Cleanser Soap and Water Discharge Instruction: May shower and wash wound with dial antibacterial soap and water prior to dressing change. Wound Cleanser Discharge Instruction: Cleanse the wound with wound cleanser prior to applying a clean dressing using gauze sponges, not tissue or cotton balls. Peri-Wound Care Sween Lotion (Moisturizing lotion) Discharge Instruction: Apply moisturizing lotion as directed Topical Primary Dressing Sorbalgon AG Dressing 2x2 (in/in) Discharge Instruction: Apply to wound bed as instructed Secondary Dressing Woven Gauze Sponge, Non-Sterile 4x4 in Discharge Instruction: Apply over primary dressing as directed. Secured  With Compression Wrap ThreePress (3 layer compression wrap) Discharge Instruction: Apply three layer compression as directed. Compression Stockings Circaid Juxta Lite Compression Wrap Quantity: 1 Right Leg Compression Amount: 30-40 mmHg Discharge Instruction: Apply Circaid Juxta Lite Compression Wrap daily as instructed. Apply first thing in the morning, remove at night before bed. Add-Ons MODENA, STEEDLEY ([id number]) [id number] Page 11 of 12 Electronic Signature(s) Signed: 03/20/2022 4:41:15 PM By: [name] Entered By: [name] on 03/20/2022 13:05:25 -------------------------------------------------------------------------------- Wound Assessment Details Patient Name: Date of Service: [name], [name] 03/20/2022 12:30 PM Medical Record Number: [id number] Patient Account Number: [id number] Date of Birth/Sex: Treating RN: 04-Dec-1930 (89 y.o. [name] Primary Care Anatalia Kronk: [name] Other Clinician: Referring Vashaun Osmon: Treating Ladaysha Soutar/Extender: Jadene Pierini in Treatment: 0 Wound Status Wound Number: 4 Primary Pressure Ulcer Etiology: Wound Location: Right, Lateral T Third oe Wound Open Wounding Event: Gradually Appeared Status: Date Acquired: 03/20/2022 Comorbid Cataracts, Lymphedema, Sleep Apnea, Coronary Artery Disease, Weeks Of Treatment: 0 History: Hypertension, Hepatitis B, Osteoarthritis Clustered Wound: No Wound Measurements Length: (cm) 0.2 Width: (cm) 0.2 Depth: (cm) 0.1 Area: (cm) 0.031 Volume: (cm) 0.003 % Reduction in Area: % Reduction in Volume: Epithelialization: Small (1-33%) Tunneling: No Undermining: No Wound Description Classification: Category/Stage III Wound Margin: Distinct, outline attached Exudate Amount: Medium Exudate Type: Serosanguineous Exudate Color: red, brown Foul Odor After Cleansing: No Slough/Fibrino No Wound Bed Granulation Amount: Large (67-100%)  Exposed Structure Granulation Quality: Red Fascia Exposed: No Necrotic Amount: None Present (0%) Fat Layer (Subcutaneous Tissue) Exposed: Yes Tendon Exposed: No Muscle Exposed: No Joint Exposed: No Bone Exposed: No Periwound Skin Texture Texture Color No Abnormalities Noted: No No Abnormalities Noted: Yes Callus: Yes Temperature / Pain Temperature: No Abnormality Moisture No Abnormalities Noted: No Dry / Scaly: Yes Treatment Notes Wound #4 (Toe Third) Wound Laterality: Right, Lateral Cleanser Soap and Water Discharge Instruction: May  shower and wash wound with dial antibacterial soap and water prior to dressing change. Wound Cleanser Discharge Instruction: Cleanse the wound with wound cleanser prior to applying a clean dressing using gauze sponges, not tissue or cotton balls. Peri-Wound Care Topical Primary Dressing Sorbalgon AG Dressing 2x2 (in/in) Discharge Instruction: Apply to wound bed as instructed SVETLANA, ILLA ([id number]) [phone].pdf Page 12 of 12 Secondary Dressing Woven Gauze Sponges 2x2 in Discharge Instruction: Apply over primary dressing as directed. Secured With Conforming Stretch Gauze Bandage, Sterile 2x75 (in/in) Discharge Instruction: Secure with stretch gauze as directed. 84M Medipore Soft Cloth Surgical T 2x10 (in/yd) ape Discharge Instruction: Secure with tape as directed. Compression Wrap Compression Stockings Add-Ons Electronic Signature(s) Signed: 03/24/2022 3:17:57 PM By: [name] Previous Signature: 03/20/2022 4:41:15 PM Version By: [name] Entered By: [name] on 03/24/2022 09:57:06 -------------------------------------------------------------------------------- Vitals Details Patient Name: Date of Service: [name], [name] 03/20/2022 12:30 PM Medical Record Number: [id number] Patient Account Number: [id number] Date of Birth/Sex: Treating RN: 02-27-1930 (89 y.o. [name] Primary Care Maurisio Ruddy: [name] Other Clinician: Referring Vernal Rutan: Treating Ming Mcmannis/Extender: Jadene Pierini in Treatment: 0 Vital Signs Time Taken: 12:46 Temperature (F): 97.9 Pulse (bpm): 73 Respiratory Rate (breaths/min): 18 Blood Pressure (mmHg): 128/73 Reference Range: 80 - 120 mg / dl Electronic Signature(s) Signed: 03/20/2022 4:41:15 PM By: [name] Entered By: [name] on 03/20/2022 [id number]

## 2022-03-27 ENCOUNTER — Encounter (HOSPITAL_BASED_OUTPATIENT_CLINIC_OR_DEPARTMENT_OTHER): Payer: Medicare Other | Admitting: General Surgery

## 2022-03-27 DIAGNOSIS — L97511 Non-pressure chronic ulcer of other part of right foot limited to breakdown of skin: Secondary | ICD-10-CM | POA: Diagnosis not present

## 2022-03-28 NOTE — Progress Notes (Signed)
Linda, [name] ([id number]) [id number] Page 1 of 9 Visit Report for 03/27/2022 Chief Complaint Document Details Patient Name: Date of Service: [name], [place] RO [name] 03/27/2022 3:30 PM Medical Record Number: [id number] Patient Account Number: [id number] Date of Birth/Sex: Treating RN: February 26, 1930 (89 y.o. F) Primary Care Provider: [name] Other Clinician: Referring Provider: Treating Provider/Extender: Jethro Bolus in Treatment: 1 Information Obtained from: Patient Chief Complaint Patient presents for treatment of open ulcers due to venous insufficiency Electronic Signature(s) Signed: 03/27/2022 4:56:05 PM By: Fredirick Maudlin MD FACS Entered By: Fredirick Maudlin on 03/27/2022 16:56:05 -------------------------------------------------------------------------------- Debridement Details Patient Name: Date of Service: [name], [name] 03/27/2022 3:30 PM Medical Record Number: [id number] Patient Account Number: [id number] Date of Birth/Sex: Treating RN: 1930/06/17 (89 y.o. Donney Rankins, Lovena Le Primary Care Provider: [name] Other Clinician: Referring Provider: Treating Provider/Extender: Jethro Bolus in Treatment: 1 Debridement Performed for Assessment: Wound #1 Left,Distal,Lateral Lower Leg Performed By: Physician Fredirick Maudlin, MD Debridement Type: Debridement Level of Consciousness (Pre-procedure): Awake and Alert Pre-procedure Verification/Time Out Yes - 15:57 Taken: Start Time: 15:57 Pain Control: Lidocaine 5% topical ointment T Area Debrided (L x W): otal 1.3 (cm) x 0.5 (cm) = 0.65 (cm) Tissue and other material debrided: Non-Viable, Eschar, Slough, Slough Level: Non-Viable Tissue Debridement Description: Selective/Open Wound Instrument: Curette Bleeding: Minimum Hemostasis Achieved: Pressure Response to Treatment: Procedure was tolerated well Level of Consciousness (Post- Awake  and Alert procedure): Post Debridement Measurements of Total Wound Length: (cm) 1.3 Width: (cm) 0.5 Depth: (cm) 0.1 Volume: (cm) 0.051 Character of Wound/Ulcer Post Debridement: Improved Post Procedure Diagnosis Same as Pre-procedure Notes scribed for Dr. Celine Ahr by [name], RN Electronic Signature(s) Signed: 03/27/2022 4:48:14 PM By: [name] Signed: 03/27/2022 5:09:54 PM By: Fredirick Maudlin MD FACS Entered By: [name] on 03/27/2022 15:57:45 Wismer, Lisbet ([id number]) [id number] Page 2 of 9 -------------------------------------------------------------------------------- Debridement Details Patient Name: Date of Service: [name], [place] RO [name] 03/27/2022 3:30 PM Medical Record Number: [id number] Patient Account Number: [id number] Date of Birth/Sex: Treating RN: [birth year] (89 y.o. [name] Primary Care Provider: [name] Other Clinician: Referring Provider: Treating Provider/Extender: Jethro Bolus in Treatment: 1 Debridement Performed for Assessment: Wound #2 Left,Proximal,Lateral Lower Leg Performed By: Physician Fredirick Maudlin, MD Debridement Type: Debridement Level of Consciousness (Pre-procedure): Awake and Alert Pre-procedure Verification/Time Out Yes - 15:57 Taken: Start Time: 15:57 Pain Control: Lidocaine 5% topical ointment T Area Debrided (L x W): otal 1.9 (cm) x 1.5 (cm) = 2.85 (cm) Tissue and other material debrided: Non-Viable, Eschar, Slough, Slough Level: Non-Viable Tissue Debridement Description: Selective/Open Wound Instrument: Curette Bleeding: Minimum Hemostasis Achieved: Pressure Response to Treatment: Procedure was tolerated well Level of Consciousness (Post- Awake and Alert procedure): Post Debridement Measurements of Total Wound Length: (cm) 1.9 Width: (cm) 1.5 Depth: (cm) 0.1 Volume: (cm) 0.224 Character of Wound/Ulcer Post Debridement:  Improved Post Procedure Diagnosis Same as Pre-procedure Notes scribed for Dr. Celine Ahr by [name], RN Electronic Signature(s) Signed: 03/27/2022 4:48:14 PM By: [name] Signed: 03/27/2022 5:09:54 PM By: Fredirick Maudlin MD FACS Entered By: [name] on 03/27/2022 16:00:53 -------------------------------------------------------------------------------- HPI Details Patient Name: Date of Service: [name], [name] 03/27/2022 3:30 PM Medical Record Number: [id number] Patient Account Number: [id number] Date of Birth/Sex: Treating RN: Jun 04, 1930 (89 y.o. F) Primary Care Provider: [name] Other Clinician: Referring Provider: Treating Provider/Extender: Jethro Bolus in Treatment: 1 History of Present Illness HPI Description: ADMISSION 03/20/2022 This is an 89 year old woman with  coronary artery disease, on chronic corticosteroids for osteoarthritis, who presents with bilateral lower leg ulcers. They have been present for a couple of weeks. I do not have any vascular studies available to discern whether or not she has had venous reflux or arterial studies done. ABIs in clinic were 1.34 on the left and 0.95 on the right. She is not diabetic. She does not smoke. Her legs are quite edematous and she has multiple hematomas of various sizes and ages extending up to at least the distal thigh. She has 3 ulcers on the left leg with slough and eschar accumulation. On the right leg, she has a tiny superficial ulcer on the anterior tibial surface. She also has a small ulcer on the lateral aspect of her right third toe. 03/27/2022: The ulcer on her right anterior tibial surface and lateral third toe are both healed. The ulcers on her left leg are smaller but have slough accumulation. Edema control is good. Her juxta lite stockings arrived in the mail, but she did not bring them with her today. Electronic Signature(s) [place], [place] ([id number])  [id number] Page 3 of 9 Signed: 03/27/2022 4:57:09 PM By: Fredirick Maudlin MD FACS Entered By: Fredirick Maudlin on 03/27/2022 16:57:08 -------------------------------------------------------------------------------- Physical Exam Details Patient Name: Date of Service: [name], [name] 03/27/2022 3:30 PM Medical Record Number: [id number] Patient Account Number: [id number] Date of Birth/Sex: Treating RN: 1930-08-14 (89 y.o. F) Primary Care Provider: [name] Other Clinician: Referring Provider: Treating Provider/Extender: Jethro Bolus in Treatment: 1 Constitutional . . . . no acute distress. Respiratory Normal work of breathing on room air. Notes 03/27/2022: The ulcer on her right anterior tibial surface and lateral third toe are both healed. The ulcers on her left leg are smaller but have slough accumulation. Edema control is good. Electronic Signature(s) Signed: 03/27/2022 4:59:55 PM By: Fredirick Maudlin MD FACS Entered By: Fredirick Maudlin on 03/27/2022 16:59:55 -------------------------------------------------------------------------------- Physician Orders Details Patient Name: Date of Service: [name], [name] 03/27/2022 3:30 PM Medical Record Number: [id number] Patient Account Number: [id number] Date of Birth/Sex: Treating RN: 13-Jan-1931 (89 y.o. [name] Primary Care Provider: [name] Other Clinician: Referring Provider: Treating Provider/Extender: Jethro Bolus in Treatment: 1 Verbal / Phone Orders: No Diagnosis Coding ICD-10 Coding Code Description L97.811 Non-pressure chronic ulcer of other part of right lower leg limited to breakdown of skin L97.822 Non-pressure chronic ulcer of other part of left lower leg with fat layer exposed L97.511 Non-pressure chronic ulcer of other part of right foot limited to breakdown of skin R60.0 Localized edema I87.2 Venous  insufficiency (chronic) (peripheral) Z79.52 Long term (current) use of systemic steroids Follow-up Appointments ppointment in 1 week. - Dr. Celine Ahr - room 2 Return A Anesthetic (In clinic) Topical Lidocaine 4% applied to wound bed Bathing/ Shower/ Hygiene May shower with protection but do not get wound dressing(s) wet. Protect dressing(s) with water repellant cover (for example, large plastic bag) or a cast cover and may then take shower. Edema Control - Lymphedema / SCD / Other Elevate legs to the level of the heart or above for 30 minutes daily and/or when sitting for 3-4 times a day throughout the day. Avoid standing for long periods of time. Non Wound Condition Other Non Wound Condition Orders/Instructions: - wrap to right leg until next week we will apply juxtalite Wound Treatment Wound #1 - Lower Leg Wound Laterality: Left, Lateral, Distal Cleanser: Soap and Water 1 x Per Week/30 Days [place], Terisha ([id number])  [id number] Page 4 of 9 Discharge Instructions: May shower and wash wound with dial antibacterial soap and water prior to dressing change. Cleanser: Wound Cleanser 1 x Per Week/30 Days Discharge Instructions: Cleanse the wound with wound cleanser prior to applying a clean dressing using gauze sponges, not tissue or cotton balls. Peri-Wound Care: Sween Lotion (Moisturizing lotion) 1 x Per Week/30 Days Discharge Instructions: Apply moisturizing lotion as directed Prim Dressing: Sorbalgon AG Dressing 2x2 (in/in) 1 x Per Week/30 Days ary Discharge Instructions: Apply to wound bed as instructed Secondary Dressing: Woven Gauze Sponge, Non-Sterile 4x4 in 1 x Per Week/30 Days Discharge Instructions: Apply over primary dressing as directed. Compression Wrap: ThreePress (3 layer compression wrap) 1 x Per Week/30 Days Discharge Instructions: Apply three layer compression as directed. Compression Stockings: Circaid Juxta Lite Compression Wrap Left Leg  Compression Amount: 30-40 mmHG Discharge Instructions: Apply Circaid Juxta Lite Compression Wrap daily as instructed. Apply first thing in the morning, remove at night before bed. Wound #2 - Lower Leg Wound Laterality: Left, Lateral, Proximal Cleanser: Soap and Water 1 x Per Week/30 Days Discharge Instructions: May shower and wash wound with dial antibacterial soap and water prior to dressing change. Cleanser: Wound Cleanser 1 x Per Week/30 Days Discharge Instructions: Cleanse the wound with wound cleanser prior to applying a clean dressing using gauze sponges, not tissue or cotton balls. Peri-Wound Care: Sween Lotion (Moisturizing lotion) 1 x Per Week/30 Days Discharge Instructions: Apply moisturizing lotion as directed Prim Dressing: Sorbalgon AG Dressing 2x2 (in/in) 1 x Per Week/30 Days ary Discharge Instructions: Apply to wound bed as instructed Secondary Dressing: Woven Gauze Sponge, Non-Sterile 4x4 in 1 x Per Week/30 Days Discharge Instructions: Apply over primary dressing as directed. Compression Wrap: ThreePress (3 layer compression wrap) 1 x Per Week/30 Days Discharge Instructions: Apply three layer compression as directed. Patient Medications llergies: clindamycin, codeine, Panlor (hydrocodone-acetamin), nitrofurantoin, oxycodone, amoxicillin A Notifications Medication Indication Start End 03/27/2022 lidocaine DOSE topical 5 % ointment - ointment topical Electronic Signature(s) Signed: 03/27/2022 5:09:54 PM By: Fredirick Maudlin MD FACS Previous Signature: 03/27/2022 4:48:14 PM Version By: [name] Entered By: Fredirick Maudlin on 03/27/2022 17:00:23 -------------------------------------------------------------------------------- Problem List Details Patient Name: Date of Service: [name], [name] 03/27/2022 3:30 PM Medical Record Number: [id number] Patient Account Number: [id number] Date of Birth/Sex: Treating RN: [birth year] (89 y.o. F) Primary Care Provider: [name] Other Clinician: Referring Provider: Treating Provider/Extender: Jethro Bolus in Treatment: 1 Active Problems ICD-10 Encounter Code Description Active Date MDM Diagnosis L97.811 Non-pressure chronic ulcer of other part of right lower leg limited to breakdown 03/20/2022 No Yes MARLETH, MCKENDALL ([id number]) [id number] Page 5 of 9 of skin [phone] Non-pressure chronic ulcer of other part of left lower leg with fat layer exposed2/19/2024 No Yes L97.511 Non-pressure chronic ulcer of other part of right foot limited to breakdown of 03/20/2022 No Yes skin R60.0 Localized edema 03/20/2022 No Yes I87.2 Venous insufficiency (chronic) (peripheral) 03/20/2022 No Yes Z79.52 Long term (current) use of systemic steroids 03/20/2022 No Yes Inactive Problems Resolved Problems Electronic Signature(s) Signed: 03/27/2022 4:55:26 PM By: Fredirick Maudlin MD FACS Entered By: Fredirick Maudlin on 03/27/2022 16:55:26 -------------------------------------------------------------------------------- Progress Note Details Patient Name: Date of Service: [name], [name] 03/27/2022 3:30 PM Medical Record Number: [id number] Patient Account Number: [id number] Date of Birth/Sex: Treating RN: [birth year] (89 y.o. F) Primary Care Provider: [name] Other Clinician: Referring Provider: Treating Provider/Extender: Jethro Bolus in Treatment: 1 Subjective Chief Complaint Information obtained from Patient  Patient presents for treatment of open ulcers due to venous insufficiency History of Present Illness (HPI) ADMISSION 03/20/2022 This is an 89 year old woman with coronary artery disease, on chronic corticosteroids for osteoarthritis, who presents with bilateral lower leg ulcers. They have been present for a couple of weeks. I do not have any vascular studies available to discern whether or not she has had venous reflux or arterial  studies done. ABIs in clinic were 1.34 on the left and 0.95 on the right. She is not diabetic. She does not smoke. Her legs are quite edematous and she has multiple hematomas of various sizes and ages extending up to at least the distal thigh. She has 3 ulcers on the left leg with slough and eschar accumulation. On the right leg, she has a tiny superficial ulcer on the anterior tibial surface. She also has a small ulcer on the lateral aspect of her right third toe. 03/27/2022: The ulcer on her right anterior tibial surface and lateral third toe are both healed. The ulcers on her left leg are smaller but have slough accumulation. Edema control is good. Her juxta lite stockings arrived in the mail, but she did not bring them with her today. Patient History Information obtained from Patient. Family History Heart Disease - Father, Hypertension - Mother, No family history of Cancer, Diabetes, Hereditary Spherocytosis, Kidney Disease, Lung Disease, Seizures, Stroke, Thyroid Problems, Tuberculosis. Social History Never smoker, Marital Status - Married, Alcohol Use - Rarely, Drug Use - No History, Caffeine Use - Moderate. Medical History Eyes KENDRAYA, HOLADAY ([id number]) [id number] Page 6 of 9 Patient has history of Cataracts Hematologic/Lymphatic Patient has history of Lymphedema Respiratory Patient has history of Sleep Apnea Cardiovascular Patient has history of Coronary Artery Disease, Hypertension Gastrointestinal Patient has history of Hepatitis B - 50+ yrs ago Musculoskeletal Patient has history of Osteoarthritis Hospitalization/Surgery History - abdominal hysterectomy. - cardiac catheterization. - cataract extraction bilat.. - colonoscopy. - bilateral total knee replacement. - upper GI endoscopy. Medical A Surgical History Notes nd Gastrointestinal diverticulosis, GERD Musculoskeletal osteoporosis Objective Constitutional no acute distress. Vitals Time  Taken: 3:35 PM, Temperature: 97.4 F, Pulse: 76 bpm, Respiratory Rate: 18 breaths/min, Blood Pressure: 109/72 mmHg. Respiratory Normal work of breathing on room air. General Notes: 03/27/2022: The ulcer on her right anterior tibial surface and lateral third toe are both healed. The ulcers on her left leg are smaller but have slough accumulation. Edema control is good. Integumentary (Hair, Skin) Wound #1 status is Open. Original cause of wound was Gradually Appeared. The date acquired was: 03/02/2022. The wound has been in treatment 1 weeks. The wound is located on the Left,Distal,Lateral Lower Leg. The wound measures 1.3cm length x 0.5cm width x 0.1cm depth; 0.511cm^2 area and 0.051cm^3 volume. There is Fat Layer (Subcutaneous Tissue) exposed. There is no tunneling or undermining noted. There is a medium amount of serosanguineous drainage noted. The wound margin is distinct with the outline attached to the wound base. There is small (1-33%) red granulation within the wound bed. There is a large (67-100%) amount of necrotic tissue within the wound bed including Adherent Slough. The periwound skin appearance had no abnormalities noted for moisture. The periwound skin appearance exhibited: Scarring, Hemosiderin Staining, Rubor. Periwound temperature was noted as No Abnormality. Wound #2 status is Open. Original cause of wound was Gradually Appeared. The date acquired was: 03/02/2022. The wound has been in treatment 1 weeks. The wound is located on the Left,Proximal,Lateral Lower Leg. The wound measures 1.9cm length x 1.5cm width x  0.1cm depth; 2.238cm^2 area and 0.224cm^3 volume. There is Fat Layer (Subcutaneous Tissue) exposed. There is no tunneling or undermining noted. There is a medium amount of serosanguineous drainage noted. The wound margin is distinct with the outline attached to the wound base. There is medium (34-66%) red granulation within the wound bed. There is a medium (34-66%) amount of  necrotic tissue within the wound bed including Adherent Slough. The periwound skin appearance had no abnormalities noted for texture. The periwound skin appearance had no abnormalities noted for moisture. The periwound skin appearance exhibited: Hemosiderin Staining, Rubor. Periwound temperature was noted as No Abnormality. Wound #3 status is Open. Original cause of wound was Gradually Appeared. The date acquired was: 03/02/2022. The wound has been in treatment 1 weeks. The wound is located on the Right,Anterior Lower Leg. The wound measures 0cm length x 0cm width x 0cm depth; 0cm^2 area and 0cm^3 volume. There is no tunneling or undermining noted. There is a none present amount of drainage noted. The wound margin is distinct with the outline attached to the wound base. There is no granulation within the wound bed. There is no necrotic tissue within the wound bed. The periwound skin appearance had no abnormalities noted for texture. The periwound skin appearance had no abnormalities noted for moisture. The periwound skin appearance exhibited: Hemosiderin Staining, Rubor. Periwound temperature was noted as No Abnormality. Wound #4 status is Healed - Epithelialized. Original cause of wound was Gradually Appeared. The date acquired was: 03/20/2022. The wound has been in treatment 1 weeks. The wound is located on the Right,Lateral T Third. The wound measures 0cm length x 0cm width x 0cm depth; 0cm^2 area and 0cm^3 oe volume. There is no tunneling or undermining noted. There is a none present amount of drainage noted. The wound margin is distinct with the outline attached to the wound base. There is no granulation within the wound bed. There is no necrotic tissue within the wound bed. The periwound skin appearance had no abnormalities noted for color. The periwound skin appearance exhibited: Callus, Dry/Scaly. Periwound temperature was noted as No Abnormality. Assessment Active Problems ICD-10 Non-pressure  chronic ulcer of other part of right lower leg limited to breakdown of skin Non-pressure chronic ulcer of other part of left lower leg with fat layer exposed Non-pressure chronic ulcer of other part of right foot limited to breakdown of skin Localized edema Venous insufficiency (chronic) (peripheral) Long term (current) use of systemic steroids RANDT, [name] ([id number]) [id number] Page 7 of 9 Procedures Wound #1 Pre-procedure diagnosis of Wound #1 is a Lymphedema located on the Left,Distal,Lateral Lower Leg . There was a Selective/Open Wound Non-Viable Tissue Debridement with a total area of 0.65 sq cm performed by Fredirick Maudlin, MD. With the following instrument(s): Curette to remove Non-Viable tissue/material. Material removed includes Eschar and Slough and after achieving pain control using Lidocaine 5% topical ointment. No specimens were taken. A time out was conducted at 15:57, prior to the start of the procedure. A Minimum amount of bleeding was controlled with Pressure. The procedure was tolerated well. Post Debridement Measurements: 1.3cm length x 0.5cm width x 0.1cm depth; 0.051cm^3 volume. Character of Wound/Ulcer Post Debridement is improved. Post procedure Diagnosis Wound #1: Same as Pre-Procedure General Notes: scribed for Dr. Celine Ahr by [name], RN. Pre-procedure diagnosis of Wound #1 is a Lymphedema located on the Left,Distal,Lateral Lower Leg . There was a Three Layer Compression Therapy Procedure by [name], RN. Post procedure Diagnosis Wound #1: Same as Pre-Procedure Wound #2 Pre-procedure  diagnosis of Wound #2 is a Lymphedema located on the Left,Proximal,Lateral Lower Leg . There was a Selective/Open Wound Non-Viable Tissue Debridement with a total area of 2.85 sq cm performed by Fredirick Maudlin, MD. With the following instrument(s): Curette to remove Non-Viable tissue/material. Material removed includes Eschar and Slough  and after achieving pain control using Lidocaine 5% topical ointment. No specimens were taken. A time out was conducted at 15:57, prior to the start of the procedure. A Minimum amount of bleeding was controlled with Pressure. The procedure was tolerated well. Post Debridement Measurements: 1.9cm length x 1.5cm width x 0.1cm depth; 0.224cm^3 volume. Character of Wound/Ulcer Post Debridement is improved. Post procedure Diagnosis Wound #2: Same as Pre-Procedure General Notes: scribed for Dr. Celine Ahr by [name], RN. There was a Three Layer Compression Therapy Procedure by [name], RN. Post procedure Diagnosis Wound #: Same as Pre-Procedure Plan Follow-up Appointments: Return Appointment in 1 week. - Dr. Celine Ahr - room 2 Anesthetic: (In clinic) Topical Lidocaine 4% applied to wound bed Bathing/ Shower/ Hygiene: May shower with protection but do not get wound dressing(s) wet. Protect dressing(s) with water repellant cover (for example, large plastic bag) or a cast cover and may then take shower. Edema Control - Lymphedema / SCD / Other: Elevate legs to the level of the heart or above for 30 minutes daily and/or when sitting for 3-4 times a day throughout the day. Avoid standing for long periods of time. Non Wound Condition: Other Non Wound Condition Orders/Instructions: - wrap to right leg until next week we will apply juxtalite The following medication(s) was prescribed: lidocaine topical 5 % ointment ointment topical was prescribed at facility WOUND #1: - Lower Leg Wound Laterality: Left, Lateral, Distal Cleanser: Soap and Water 1 x Per Week/30 Days Discharge Instructions: May shower and wash wound with dial antibacterial soap and water prior to dressing change. Cleanser: Wound Cleanser 1 x Per Week/30 Days Discharge Instructions: Cleanse the wound with wound cleanser prior to applying a clean dressing using gauze sponges, not tissue or cotton balls. Peri-Wound Care: Sween  Lotion (Moisturizing lotion) 1 x Per Week/30 Days Discharge Instructions: Apply moisturizing lotion as directed Prim Dressing: Sorbalgon AG Dressing 2x2 (in/in) 1 x Per Week/30 Days ary Discharge Instructions: Apply to wound bed as instructed Secondary Dressing: Woven Gauze Sponge, Non-Sterile 4x4 in 1 x Per Week/30 Days Discharge Instructions: Apply over primary dressing as directed. Com pression Wrap: ThreePress (3 layer compression wrap) 1 x Per Week/30 Days Discharge Instructions: Apply three layer compression as directed. Com pression Stockings: Circaid Juxta Lite Compression Wrap Compression Amount: 30-40 mmHg (left) Discharge Instructions: Apply Circaid Juxta Lite Compression Wrap daily as instructed. Apply first thing in the morning, remove at night before bed. WOUND #2: - Lower Leg Wound Laterality: Left, Lateral, Proximal Cleanser: Soap and Water 1 x Per Week/30 Days Discharge Instructions: May shower and wash wound with dial antibacterial soap and water prior to dressing change. Cleanser: Wound Cleanser 1 x Per Week/30 Days Discharge Instructions: Cleanse the wound with wound cleanser prior to applying a clean dressing using gauze sponges, not tissue or cotton balls. Peri-Wound Care: Sween Lotion (Moisturizing lotion) 1 x Per Week/30 Days Discharge Instructions: Apply moisturizing lotion as directed Prim Dressing: Sorbalgon AG Dressing 2x2 (in/in) 1 x Per Week/30 Days ary Discharge Instructions: Apply to wound bed as instructed Secondary Dressing: Woven Gauze Sponge, Non-Sterile 4x4 in 1 x Per Week/30 Days Discharge Instructions: Apply over primary dressing as directed. Com pression Wrap: ThreePress (3 layer  compression wrap) 1 x Per Week/30 Days Discharge Instructions: Apply three layer compression as directed. SIMARA, NADOLNY ([id number]) [id number] Page 8 of 9 03/27/2022: The ulcer on her right anterior tibial surface and lateral third toe are both  healed. The ulcers on her left leg are smaller but have slough accumulation. Edema control is good. I used a curette to debride slough and eschar from both of the left leg wounds. We will continue silver alginate with 3 layer compression. Because she is does not have her juxta lite stocking with her, we will reapply a 3 layer compression wrap on her right leg. She was asked to please bring her juxta lites with her to her visit next week. Electronic Signature(s) Signed: 03/27/2022 5:01:32 PM By: Fredirick Maudlin MD FACS Entered By: Fredirick Maudlin on 03/27/2022 17:01:32 -------------------------------------------------------------------------------- HxROS Details Patient Name: Date of Service: [name], [name] 03/27/2022 3:30 PM Medical Record Number: [id number] Patient Account Number: [id number] Date of Birth/Sex: Treating RN: Dec 24, 1930 (89 y.o. F) Primary Care Provider: [name] Other Clinician: Referring Provider: Treating Provider/Extender: Jethro Bolus in Treatment: 1 Information Obtained From Patient Eyes Medical History: Positive for: Cataracts Hematologic/Lymphatic Medical History: Positive for: Lymphedema Respiratory Medical History: Positive for: Sleep Apnea Cardiovascular Medical History: Positive for: Coronary Artery Disease; Hypertension Gastrointestinal Medical History: Positive for: Hepatitis B - 50+ yrs ago Past Medical History Notes: diverticulosis, GERD Musculoskeletal Medical History: Positive for: Osteoarthritis Past Medical History Notes: osteoporosis HBO Extended History Items Eyes: Cataracts Immunizations Pneumococcal Vaccine: Received Pneumococcal Vaccination: Yes Received Pneumococcal Vaccination On or After 60th Birthday: Yes Implantable Devices No devices added Hospitalization / Surgery History BRIEA, MULLINAX ([id number]) [id number] Page 9 of 9 Type of  Hospitalization/Surgery abdominal hysterectomy cardiac catheterization cataract extraction bilat. colonoscopy bilateral total knee replacement upper GI endoscopy Family and Social History Cancer: No; Diabetes: No; Heart Disease: Yes - Father; Hereditary Spherocytosis: No; Hypertension: Yes - Mother; Kidney Disease: No; Lung Disease: No; Seizures: No; Stroke: No; Thyroid Problems: No; Tuberculosis: No; Never smoker; Marital Status - Married; Alcohol Use: Rarely; Drug Use: No History; Caffeine Use: Moderate; Financial Concerns: No; Food, Clothing or Shelter Needs: No; Support System Lacking: No; Transportation Concerns: No Electronic Signature(s) Signed: 03/27/2022 5:09:54 PM By: Fredirick Maudlin MD FACS Entered By: Fredirick Maudlin on 03/27/2022 16:58:32 -------------------------------------------------------------------------------- SuperBill Details Patient Name: Date of Service: [name], [place] RO L 03/27/2022 Medical Record Number: [id number] Patient Account Number: [id number] Date of Birth/Sex: Treating RN: 1930-12-24 (89 y.o. F) Primary Care Provider: [name] Other Clinician: Referring Provider: Treating Provider/Extender: Jethro Bolus in Treatment: 1 Diagnosis Coding ICD-10 Codes Code Description [phone] Non-pressure chronic ulcer of other part of right lower leg limited to breakdown of skin L97.822 Non-pressure chronic ulcer of other part of left lower leg with fat layer exposed L97.511 Non-pressure chronic ulcer of other part of right foot limited to breakdown of skin R60.0 Localized edema I87.2 Venous insufficiency (chronic) (peripheral) Z79.52 Long term (current) use of systemic steroids Facility Procedures : CPT4 Code: [id number] Description: [id number] - DEBRIDE WOUND 1ST 20 SQ CM OR < ICD-10 Diagnosis Description L97.822 Non-pressure chronic ulcer of other part of left lower leg with fat layer exposed Modifier: Quantity: 1 Physician  Procedures : CPT4 Code Description Modifier [id number] - WC PHYS LEVEL 3 - EST PT 25 ICD-10 Diagnosis Description L97.822 Non-pressure chronic ulcer of other part of left lower leg with fat layer exposed R60.0 Localized edema I87.2 Venous insufficiency (chronic)  (peripheral) Z79.52 Long term (current)  use of systemic steroids Quantity: 1 : [id number] - WC PHYS DEBR WO ANESTH 20 SQ CM ICD-10 Diagnosis Description L97.822 Non-pressure chronic ulcer of other part of left lower leg with fat layer exposed Quantity: 1 Electronic Signature(s) Signed: 03/27/2022 5:02:21 PM By: Fredirick Maudlin MD FACS Entered By: Fredirick Maudlin on 03/27/2022 17:02:21

## 2022-03-28 NOTE — Progress Notes (Addendum)
MEDORA, HOLIHAN ([id number]) [id number] Page 1 of 13 Visit Report for 03/27/2022 Arrival Information Details Patient Name: Date of Service: [name], [place] RO [name] 03/27/2022 3:30 PM Medical Record Number: [id number] Patient Account Number: [id number] Date of Birth/Sex: Treating RN: January 02, 1931 (89 y.o. [name], [name] Primary Care Richrd Kuzniar: [name] Other Clinician: Referring Jonluke Cobbins: Treating Maythe Deramo/Extender: Jethro Bolus in Treatment: 1 Visit Information History Since Last Visit Added or deleted any medications: No Patient Arrived: [name] Any new allergies or adverse reactions: No Arrival Time: 15:31 Had a fall or experienced change in No Accompanied By: husband activities of daily living that may affect Transfer Assistance: Manual risk of falls: Patient Identification Verified: Yes Signs or symptoms of abuse/neglect since last visito No Secondary Verification Process Completed: Yes Hospitalized since last visit: No Patient Has Alerts: No Implantable device outside of the clinic excluding No cellular tissue based products placed in the center since last visit: Has Dressing in Place as Prescribed: Yes Has Compression in Place as Prescribed: Yes Pain Present Now: No Electronic Signature(s) Signed: 03/27/2022 4:48:14 PM By: [name] Entered By: [name] on 03/27/2022 15:34:51 -------------------------------------------------------------------------------- Compression Therapy Details Patient Name: Date of Service: [name], [name] 03/27/2022 3:30 PM Medical Record Number: [id number] Patient Account Number: [id number] Date of Birth/Sex: Treating RN: Jul 31, 1930 (89 y.o. [name] Primary Care Frisco Cordts: [name] Other Clinician: Referring Darionna Banke: Treating Johnrobert Foti/Extender: Jethro Bolus in Treatment: 1 Compression Therapy Performed for Wound Assessment:  Wound #1 Left,Distal,Lateral Lower Leg Performed By: Clinician [name], RN Compression Type: Three Layer Post Procedure Diagnosis Same as Pre-procedure Electronic Signature(s) Signed: 03/27/2022 4:48:14 PM By: [name] Entered By: [name] on 03/27/2022 15:58:40 -------------------------------------------------------------------------------- Compression Therapy Details Patient Name: Date of Service: [name], [name] 03/27/2022 3:30 PM Medical Record Number: [id number] Patient Account Number: [id number] Date of Birth/Sex: Treating RN: 07/05/1930 (89 y.o. [name] Primary Care Abdias Hickam: [name] Other Clinician: Referring Deloris Moger: Treating Sharnetta Gielow/Extender: Jethro Bolus in Treatment: 1 Compression Therapy Performed for Wound Assessment: NonWound Condition Lymphedema - Right Leg Performed By: Clinician [name], RN Compression Type: Three Layer Post Procedure Diagnosis Same as Pre-procedure [name], [name] ([id number]) [id number] Page 2 of 13 Electronic Signature(s) Signed: 03/27/2022 4:48:14 PM By: Sabas Sous By: [name] on 03/27/2022 15:58:51 -------------------------------------------------------------------------------- Encounter Discharge Information Details Patient Name: Date of Service: [name], [name] 03/27/2022 3:30 PM Medical Record Number: [id number] Patient Account Number: [id number] Date of Birth/Sex: Treating RN: 1931/01/13 (89 y.o. [name] Primary Care Analiza Cowger: [name] Other Clinician: Referring Tanesha Arambula: Treating Dink Creps/Extender: Jethro Bolus in Treatment: 1 Encounter Discharge Information Items Post Procedure Vitals Discharge Condition: Stable Temperature (F): 97.4 Ambulatory Status: Walker Pulse (bpm): 76 Discharge Destination: Home Respiratory Rate (breaths/min):  18 Transportation: Private Auto Blood Pressure (mmHg): 109/72 Accompanied By: self Schedule Follow-up Appointment: Yes Clinical Summary of Care: Patient Declined Electronic Signature(s) Signed: 03/27/2022 4:48:14 PM By: [name] Entered By: [name] on 03/27/2022 16:15:47 -------------------------------------------------------------------------------- Lower Extremity Assessment Details Patient Name: Date of Service: [name], [name] 03/27/2022 3:30 PM Medical Record Number: [id number] Patient Account Number: [id number] Date of Birth/Sex: Treating RN: 03/19/1930 (89 y.o. [name] Primary Care Stefannie Defeo: [name] Other Clinician: Referring Ladaisha Portillo: Treating Severina Sykora/Extender: Jethro Bolus in Treatment: 1 Edema Assessment Assessed: [name]: No] [name]: No] [Left: Edema] [Right: :] Calf Left: Right: Point of Measurement: From Medial Instep 34 cm 34.5 cm  Ankle Left: Right: Point of Measurement: From Medial Instep 20.5 cm 21 cm Vascular Assessment Pulses: Dorsalis Pedis Palpable: [Left:Yes] [Right:Yes] Electronic Signature(s) Signed: 03/27/2022 4:48:14 PM By: [name] Entered By: [name] on 03/27/2022 15:40:36 Multi Wound Chart Details -------------------------------------------------------------------------------- [name] ([id number]) [id number] Page 3 of 13 Patient Name: Date of Service: [name], [place] RO [name] 03/27/2022 3:30 PM Medical Record Number: [id number] Patient Account Number: [id number] Date of Birth/Sex: Treating RN: Mar 03, 1930 (89 y.o. F) Primary Care Skylar Flynt: [name] Other Clinician: Referring Brixon Zhen: Treating Janelli Welling/Extender: Jethro Bolus in Treatment: 1 Vital Signs Height(in): Pulse(bpm): 35 Weight(lbs): Blood Pressure(mmHg): 109/72 Body Mass Index(BMI): Temperature(F): 97.4 Respiratory Rate(breaths/min):  18 Wound Assessments Wound Number: '1 2 3 '$ Photos: Left, Distal, Lateral Lower Leg Left, Proximal, Lateral Lower Leg Right, Anterior Lower Leg Wound Location: Gradually Appeared Gradually Appeared Gradually Appeared Wounding Event: Lymphedema Lymphedema Lymphedema Primary Etiology: Cataracts, Lymphedema, Sleep Cataracts, Lymphedema, Sleep Cataracts, Lymphedema, Sleep Comorbid History: Apnea, Coronary Artery Disease, Apnea, Coronary Artery Disease, Apnea, Coronary Artery Disease, Hypertension, Hepatitis B, Hypertension, Hepatitis B, Hypertension, Hepatitis B, Osteoarthritis Osteoarthritis Osteoarthritis 03/02/2022 03/02/2022 03/02/2022 Date Acquired: '1 1 1 '$ Weeks of Treatment: Open Open Open Wound Status: No No No Wound Recurrence: 1.3x0.5x0.1 1.9x1.5x0.1 0x0x0 Measurements L x W x D (cm) 0.511 2.238 0 A (cm) : rea 0.051 0.224 0 Volume (cm) : -8.50% 28.80% 100.00% % Reduction in A rea: 45.70% 28.70% 100.00% % Reduction in Volume: Full Thickness Without Exposed Full Thickness Without Exposed Full Thickness Without Exposed Classification: Support Structures Support Structures Support Structures Medium Medium None Present Exudate A mount: Serosanguineous Serosanguineous N/A Exudate Type: red, brown red, brown N/A Exudate Color: Distinct, outline attached Distinct, outline attached Distinct, outline attached Wound Margin: Small (1-33%) Medium (34-66%) None Present (0%) Granulation A mount: Red Red N/A Granulation Quality: Large (67-100%) Medium (34-66%) None Present (0%) Necrotic A mount: Fat Layer (Subcutaneous Tissue): Yes Fat Layer (Subcutaneous Tissue): Yes Fascia: No Exposed Structures: Fascia: No Fascia: No Fat Layer (Subcutaneous Tissue): No Tendon: No Tendon: No Tendon: No Muscle: No Muscle: No Muscle: No Joint: No Joint: No Joint: No Bone: No Bone: No Bone: No Small (1-33%) Small (1-33%) Large (67-100%) Epithelialization: Debridement - Selective/Open  Wound Debridement - Selective/Open Wound N/A Debridement: Pre-procedure Verification/Time Out 15:57 15:57 N/A Taken: Lidocaine 5% topical ointment Lidocaine 5% topical ointment N/A Pain Control: Necrotic/Eschar, Psychologist, prison and probation services, Slough N/A Tissue Debrided: Non-Viable Tissue Non-Viable Tissue N/A Level: 0.65 2.85 N/A Debridement A (sq cm): rea Curette Curette N/A Instrument: Minimum Minimum N/A Bleeding: Pressure Pressure N/A Hemostasis A chieved: Procedure was tolerated well Procedure was tolerated well N/A Debridement Treatment Response: 1.3x0.5x0.1 1.9x1.5x0.1 N/A Post Debridement Measurements L x W x D (cm) 0.051 0.224 N/A Post Debridement Volume: (cm) Scarring: Yes No Abnormalities Noted No Abnormalities Noted Periwound Skin Texture: No Abnormalities Noted No Abnormalities Noted No Abnormalities Noted Periwound Skin Moisture: Hemosiderin Staining: Yes Hemosiderin Staining: Yes Hemosiderin Staining: Yes Periwound Skin Color: Rubor: Yes Rubor: Yes Rubor: Yes No Abnormality No Abnormality No Abnormality Temperature: Compression Therapy Debridement N/A Procedures Performed: Debridement Wound Number: 4 N/A N/A Photos: N/A [name] ([id number]) [id number] Page 4 of 13 Right, Lateral T Third oe N/A N/A Wound Location: Gradually Appeared N/A N/A Wounding Event: Pressure Ulcer N/A N/A Primary Etiology: Cataracts, Lymphedema, Sleep N/A N/A Comorbid History: Apnea, Coronary Artery Disease, Hypertension, Hepatitis B, Osteoarthritis 03/20/2022 N/A N/A Date A cquired: 1 N/A N/A Weeks of Treatment: Healed - Epithelialized N/A N/A Wound Status: No N/A N/A  Wound Recurrence: 0x0x0 N/A N/A Measurements L x W x D (cm) 0 N/A N/A A (cm) : rea 0 N/A N/A Volume (cm) : 100.00% N/A N/A % Reduction in A rea: 100.00% N/A N/A % Reduction in Volume: Category/Stage III N/A N/A Classification: None Present N/A N/A Exudate  A mount: N/A N/A N/A Exudate Type: N/A N/A N/A Exudate Color: Distinct, outline attached N/A N/A Wound Margin: None Present (0%) N/A N/A Granulation A mount: N/A N/A N/A Granulation Quality: None Present (0%) N/A N/A Necrotic A mount: Fascia: No N/A N/A Exposed Structures: Fat Layer (Subcutaneous Tissue): No Tendon: No Muscle: No Joint: No Bone: No Large (67-100%) N/A N/A Epithelialization: N/A N/A N/A Debridement: N/A N/A N/A Pain Control: N/A N/A N/A Tissue Debrided: N/A N/A N/A Level: N/A N/A N/A Debridement A (sq cm): rea N/A N/A N/A Instrument: N/A N/A N/A Bleeding: N/A N/A N/A Hemostasis A chieved: Debridement Treatment Response: N/A N/A N/A Post Debridement Measurements L x N/A N/A N/A W x D (cm) N/A N/A N/A Post Debridement Volume: (cm) Callus: Yes N/A N/A Periwound Skin Texture: Dry/Scaly: Yes N/A N/A Periwound Skin Moisture: No Abnormalities Noted N/A N/A Periwound Skin Color: No Abnormality N/A N/A Temperature: N/A N/A N/A Procedures Performed: Treatment Notes Wound #1 (Lower Leg) Wound Laterality: Left, Lateral, Distal Cleanser Soap and Water Discharge Instruction: May shower and wash wound with dial antibacterial soap and water prior to dressing change. Wound Cleanser Discharge Instruction: Cleanse the wound with wound cleanser prior to applying a clean dressing using gauze sponges, not tissue or cotton balls. Peri-Wound Care Sween Lotion (Moisturizing lotion) Discharge Instruction: Apply moisturizing lotion as directed Topical Primary Dressing Sorbalgon AG Dressing 2x2 (in/in) Discharge Instruction: Apply to wound bed as instructed Secondary Dressing Woven Gauze Sponge, Non-Sterile 4x4 in Discharge Instruction: Apply over primary dressing as directed. [name], [name] ([id number]) [id number] Page 5 of 13 Secured With Compression Wrap ThreePress (3 layer compression wrap) Discharge Instruction: Apply  three layer compression as directed. Compression Stockings Circaid Juxta Lite Compression Wrap Quantity: 1 Left Leg Compression Amount: 30-40 mmHg Discharge Instruction: Apply Circaid Juxta Lite Compression Wrap daily as instructed. Apply first thing in the morning, remove at night before bed. Add-Ons Wound #2 (Lower Leg) Wound Laterality: Left, Lateral, Proximal Cleanser Soap and Water Discharge Instruction: May shower and wash wound with dial antibacterial soap and water prior to dressing change. Wound Cleanser Discharge Instruction: Cleanse the wound with wound cleanser prior to applying a clean dressing using gauze sponges, not tissue or cotton balls. Peri-Wound Care Sween Lotion (Moisturizing lotion) Discharge Instruction: Apply moisturizing lotion as directed Topical Primary Dressing Sorbalgon AG Dressing 2x2 (in/in) Discharge Instruction: Apply to wound bed as instructed Secondary Dressing Woven Gauze Sponge, Non-Sterile 4x4 in Discharge Instruction: Apply over primary dressing as directed. Secured With Compression Wrap ThreePress (3 layer compression wrap) Discharge Instruction: Apply three layer compression as directed. Compression Stockings Add-Ons Wound #3 (Lower Leg) Wound Laterality: Right, Anterior Cleanser Peri-Wound Care Topical Primary Dressing Secondary Dressing Secured With Compression Wrap Compression Stockings Add-Ons Wound #4 (Toe Third) Wound Laterality: Right, Lateral Cleanser Peri-Wound Care Topical Primary Dressing Secondary Dressing Secured With [place], [name] ([id number]) [id number] Page 6 of 13 Compression Wrap Compression Stockings Add-Ons Electronic Signature(s) Signed: 03/27/2022 4:55:38 PM By: Linda Maudlin MD FACS Entered By: [name] on 03/27/2022 16:55:38 -------------------------------------------------------------------------------- Multi-Disciplinary Care Plan Details Patient Name: Date  of Service: [name], [name] 03/27/2022 3:30 PM Medical Record Number: [id number] Patient Account Number: [id number] Date of Birth/Sex: Treating RN:  07-31-1930 (89 y.o. [name] Primary Care Kratos Ruscitti: [name] Other Clinician: Referring Josel Keo: Treating Breianna Delfino/Extender: Jethro Bolus in Treatment: 1 Active Inactive Abuse / Safety / Falls / Self Care Management Nursing Diagnoses: History of Falls Impaired physical mobility Potential for falls Goals: Patient will not experience any injury related to falls Date Initiated: 03/20/2022 Target Resolution Date: 05/05/2022 Goal Status: Active Patient/caregiver will demonstrate safe use of adaptive devices to increase mobility Date Initiated: 03/20/2022 Target Resolution Date: 05/05/2022 Goal Status: Active Interventions: Assess: immobility, friction, shearing, incontinence upon admission and as needed Provide education on fall prevention Notes: Wound/Skin Impairment Nursing Diagnoses: Impaired tissue integrity Knowledge deficit related to ulceration/compromised skin integrity Goals: Patient/caregiver will verbalize understanding of skin care regimen Date Initiated: 03/20/2022 Target Resolution Date: 05/05/2022 Goal Status: Active Interventions: Assess ulceration(s) every visit Treatment Activities: Skin care regimen initiated : 03/20/2022 Topical wound management initiated : 03/20/2022 Notes: Electronic Signature(s) Signed: 03/27/2022 4:48:14 PM By: [name] Entered By: [name] on 03/27/2022 15:59:01 Wilber, [place] ([id number]) [id number] Page 7 of 13 -------------------------------------------------------------------------------- Pain Assessment Details Patient Name: Date of Service: [name], [place] RO [name] 03/27/2022 3:30 PM Medical Record Number: [id number] Patient Account Number: [id number] Date of Birth/Sex: Treating RN: 07/16/1930 (89 y.o. [name] Primary Care Rashed Edler: [name] Other Clinician: Referring Cruz Devilla: Treating Jrue Jarriel/Extender: Jethro Bolus in Treatment: 1 Active Problems Location of Pain Severity and Description of Pain Patient Has Paino No Site Locations Rate the pain. Current Pain Level: 0 Pain Management and Medication Current Pain Management: Electronic Signature(s) Signed: 03/27/2022 4:48:14 PM By: [name] Entered By: [name] on 03/27/2022 15:35:00 -------------------------------------------------------------------------------- Patient/Caregiver Education Details Patient Name: Date of Service: [name], [name] 2/26/2024andnbsp3:30 PM Medical Record Number: [id number] Patient Account Number: [id number] Date of Birth/Gender: Treating RN: 10-06-1930 (89 y.o. [name] Primary Care Physician: [name] Other Clinician: Referring Physician: Treating Physician/Extender: Jethro Bolus in Treatment: 1 Education Assessment Education Provided To: Patient Education Topics Provided Wound/Skin Impairment: Methods: Explain/Verbal Responses: Reinforcements needed, State content correctly Electronic Signature(s) Signed: 03/27/2022 4:48:14 PM By: [name] Entered By: [name] on 03/27/2022 15:59:17 Wound Assessment Details -------------------------------------------------------------------------------- [name] ([id number]) [id number] Page 8 of 13 Patient Name: Date of Service: [name], [place] RO [name] 03/27/2022 3:30 PM Medical Record Number: [id number] Patient Account Number: [id number] Date of Birth/Sex: Treating RN: [birth year] (89 y.o. [name] Primary Care Trevan Messman: [name] Other Clinician: Referring Crescent Gotham: Treating Tyshia Fenter/Extender: Jethro Bolus in Treatment: 1 Wound Status Wound Number: 1  Primary Lymphedema Etiology: Wound Location: Left, Distal, Lateral Lower Leg Wound Open Wounding Event: Gradually Appeared Status: Date Acquired: 03/02/2022 Comorbid Cataracts, Lymphedema, Sleep Apnea, Coronary Artery Disease, Weeks Of Treatment: 1 History: Hypertension, Hepatitis B, Osteoarthritis Clustered Wound: No Photos Wound Measurements Length: (cm) 1.3 % Reduction in Area: -8.5% Width: (cm) 0.5 % Reduction in Volume: 45.7% Depth: (cm) 0.1 Epithelialization: Small (1-33%) Area: (cm) 0.511 Tunneling: No Volume: (cm) 0.051 Undermining: No Wound Description Classification: Full Thickness Without Exposed Support Structures Foul Odor After Cleansing: No Wound Margin: Distinct, outline attached Slough/Fibrino Yes Exudate Amount: Medium Exudate Type: Serosanguineous Exudate Color: red, brown Wound Bed Granulation Amount: Small (1-33%) Exposed Structure Granulation Quality: Red Fascia Exposed: No Necrotic Amount: Large (67-100%) Fat Layer (Subcutaneous Tissue) Exposed: Yes Necrotic Quality: Adherent Slough Tendon Exposed: No Muscle Exposed: No Joint Exposed: No Bone Exposed: No Periwound Skin Texture Texture Color No Abnormalities Noted: No No Abnormalities Noted:  No Scarring: Yes Hemosiderin Staining: Yes Rubor: Yes Moisture No Abnormalities Noted: Yes Temperature / Pain Temperature: No Abnormality Treatment Notes Wound #1 (Lower Leg) Wound Laterality: Left, Lateral, Distal Cleanser Soap and Water Discharge Instruction: May shower and wash wound with dial antibacterial soap and water prior to dressing change. Wound Cleanser Discharge Instruction: Cleanse the wound with wound cleanser prior to applying a clean dressing using gauze sponges, not tissue or cotton balls. Peri-Wound Care Sween Lotion (Moisturizing lotion) [place], [place] ([id number]) [id number] Page 9 of 13 Discharge Instruction: Apply moisturizing lotion as  directed Topical Primary Dressing Sorbalgon AG Dressing 2x2 (in/in) Discharge Instruction: Apply to wound bed as instructed Secondary Dressing Woven Gauze Sponge, Non-Sterile 4x4 in Discharge Instruction: Apply over primary dressing as directed. Secured With Compression Wrap ThreePress (3 layer compression wrap) Discharge Instruction: Apply three layer compression as directed. Compression Stockings Circaid Juxta Lite Compression Wrap Quantity: 1 Left Leg Compression Amount: 30-40 mmHg Discharge Instruction: Apply Circaid Juxta Lite Compression Wrap daily as instructed. Apply first thing in the morning, remove at night before bed. Add-Ons Electronic Signature(s) Signed: 03/27/2022 4:48:14 PM By: [name] Entered By: [name] on 03/27/2022 15:45:02 -------------------------------------------------------------------------------- Wound Assessment Details Patient Name: Date of Service: [name], [name] 03/27/2022 3:30 PM Medical Record Number: [id number] Patient Account Number: [id number] Date of Birth/Sex: Treating RN: 06/24/1930 (89 y.o. [name] Primary Care Jyaire Koudelka: [name] Other Clinician: Referring Leonie Amacher: Treating Shelby Anderle/Extender: Jethro Bolus in Treatment: 1 Wound Status Wound Number: 2 Primary Lymphedema Etiology: Wound Location: Left, Proximal, Lateral Lower Leg Wound Open Wounding Event: Gradually Appeared Status: Date Acquired: 03/02/2022 Comorbid Cataracts, Lymphedema, Sleep Apnea, Coronary Artery Disease, Weeks Of Treatment: 1 History: Hypertension, Hepatitis B, Osteoarthritis Clustered Wound: No Photos Wound Measurements Length: (cm) 1.9 Width: (cm) 1.5 Depth: (cm) 0.1 Area: (cm) 2.238 Volume: (cm) 0.224 % Reduction in Area: 28.8% % Reduction in Volume: 28.7% Epithelialization: Small (1-33%) Tunneling: No Undermining: No Wound Description Classification: Full Thickness Without  Exposed Support Structures Wound Margin: Distinct, outline attached [name], [name] ([id number]) Exudate Amount: Medium Exudate Type: Serosanguineous Exudate Color: red, brown Foul Odor After Cleansing: No Slough/Fibrino Yes [id number] Page 10 of 13 Wound Bed Granulation Amount: Medium (34-66%) Exposed Structure Granulation Quality: Red Fascia Exposed: No Necrotic Amount: Medium (34-66%) Fat Layer (Subcutaneous Tissue) Exposed: Yes Necrotic Quality: Adherent Slough Tendon Exposed: No Muscle Exposed: No Joint Exposed: No Bone Exposed: No Periwound Skin Texture Texture Color No Abnormalities Noted: Yes No Abnormalities Noted: No Hemosiderin Staining: Yes Moisture Rubor: Yes No Abnormalities Noted: Yes Temperature / Pain Temperature: No Abnormality Treatment Notes Wound #2 (Lower Leg) Wound Laterality: Left, Lateral, Proximal Cleanser Soap and Water Discharge Instruction: May shower and wash wound with dial antibacterial soap and water prior to dressing change. Wound Cleanser Discharge Instruction: Cleanse the wound with wound cleanser prior to applying a clean dressing using gauze sponges, not tissue or cotton balls. Peri-Wound Care Sween Lotion (Moisturizing lotion) Discharge Instruction: Apply moisturizing lotion as directed Topical Primary Dressing Sorbalgon AG Dressing 2x2 (in/in) Discharge Instruction: Apply to wound bed as instructed Secondary Dressing Woven Gauze Sponge, Non-Sterile 4x4 in Discharge Instruction: Apply over primary dressing as directed. Secured With Compression Wrap ThreePress (3 layer compression wrap) Discharge Instruction: Apply three layer compression as directed. Compression Stockings Add-Ons Electronic Signature(s) Signed: 03/27/2022 4:48:14 PM By: [name] Entered By: [name] on 03/27/2022 15:45:34 -------------------------------------------------------------------------------- Wound  Assessment Details Patient Name: Date of Service: [name], [name] 03/27/2022 3:30 PM Medical  Record Number: [id number] Patient Account Number: [id number] Date of Birth/Sex: Treating RN: 1931-01-20 (89 y.o. [name] Primary Care Lakecia Deschamps: [name] Other Clinician: Referring [name]: Treating Indiya Izquierdo/Extender: Jethro Bolus in Treatment: 1 Wound Status Wound Number: 3 Primary Lymphedema Etiology: Wound Location: Right, Anterior Lower Leg [name], [name] ([id number]) [id number] Page 11 of 13 Wound Open Wounding Event: Gradually Appeared Status: Date Acquired: 03/02/2022 Comorbid Cataracts, Lymphedema, Sleep Apnea, Coronary Artery Disease, Weeks Of Treatment: 1 History: Hypertension, Hepatitis B, Osteoarthritis Clustered Wound: No Photos Wound Measurements Length: (cm) Width: (cm) Depth: (cm) Area: (cm) Volume: (cm) 0 % Reduction in Area: 100% 0 % Reduction in Volume: 100% 0 Epithelialization: Large (67-100%) 0 Tunneling: No 0 Undermining: No Wound Description Classification: Full Thickness Without Exposed Support Structures Wound Margin: Distinct, outline attached Exudate Amount: None Present Foul Odor After Cleansing: No Slough/Fibrino No Wound Bed Granulation Amount: None Present (0%) Exposed Structure Necrotic Amount: None Present (0%) Fascia Exposed: No Fat Layer (Subcutaneous Tissue) Exposed: No Tendon Exposed: No Muscle Exposed: No Joint Exposed: No Bone Exposed: No Periwound Skin Texture Texture Color No Abnormalities Noted: Yes No Abnormalities Noted: No Hemosiderin Staining: Yes Moisture Rubor: Yes No Abnormalities Noted: Yes Temperature / Pain Temperature: No Abnormality Electronic Signature(s) Signed: 03/27/2022 4:48:14 PM By: [name] Entered By: [name] on 03/27/2022  15:46:02 -------------------------------------------------------------------------------- Wound Assessment Details Patient Name: Date of Service: [name], [name] 03/27/2022 3:30 PM Medical Record Number: [id number] Patient Account Number: [id number] Date of Birth/Sex: Treating RN: [birth year] (89 y.o. [name] Primary Care [name]: [name] Other Clinician: Referring [name]: Treating Tyrika Newman/Extender: Jethro Bolus in Treatment: 1 Wound Status Wound Number: 4 Primary Pressure Ulcer Etiology: Wound Location: Right, Lateral T Third oe Wound Healed - Epithelialized Wounding Event: Gradually Appeared Status: Date Acquired: 03/20/2022 Comorbid Cataracts, Lymphedema, Sleep Apnea, Coronary Artery Disease, Weeks Of Treatment: 1 History: Hypertension, Hepatitis B, Osteoarthritis Clustered Wound: No Zink, Jameelah ([id number]) [id number] Page 12 of 13 Photos Wound Measurements Length: (cm) Width: (cm) Depth: (cm) Area: (cm) Volume: (cm) 0 % Reduction in Area: 100% 0 % Reduction in Volume: 100% 0 Epithelialization: Large (67-100%) 0 Tunneling: No 0 Undermining: No Wound Description Classification: Category/Stage III Wound Margin: Distinct, outline attached Exudate Amount: None Present Foul Odor After Cleansing: No Slough/Fibrino No Wound Bed Granulation Amount: None Present (0%) Exposed Structure Necrotic Amount: None Present (0%) Fascia Exposed: No Fat Layer (Subcutaneous Tissue) Exposed: No Tendon Exposed: No Muscle Exposed: No Joint Exposed: No Bone Exposed: No Periwound Skin Texture Texture Color No Abnormalities Noted: No No Abnormalities Noted: Yes Callus: Yes Temperature / Pain Temperature: No Abnormality Moisture No Abnormalities Noted: No Dry / Scaly: Yes Treatment Notes Wound #4 (Toe Third) Wound Laterality: Right, Lateral Cleanser Peri-Wound Care Topical Primary  Dressing Secondary Dressing Secured With Compression Wrap Compression Stockings Add-Ons Electronic Signature(s) Signed: 03/27/2022 4:48:14 PM By: [name] Entered By: [name] on 03/27/2022 15:59:34 -------------------------------------------------------------------------------- Vitals Details Patient Name: Date of Service: [name], [name] 03/27/2022 3:30 PM Medical Record Number: [id number] Patient Account Number: [id number] NEYRA, HAJDU ([id number]) [id number] Page 13 of 13 Date of Birth/Sex: Treating RN: 04-30-1930 (89 y.o. [name] Primary Care Shawon Denzer: Other Clinician: [name] Referring Bijan Ridgley: Treating Consepcion Utt/Extender: Jethro Bolus in Treatment: 1 Vital Signs Time Taken: 15:35 Temperature (F): 97.4 Pulse (bpm): 76 Respiratory Rate (breaths/min): 18 Blood Pressure (mmHg): 109/72 Reference Range: 80 - 120 mg / dl Electronic Signature(s) Signed: 03/27/2022 4:48:14 PM By:  [name] Entered By: [name] on 03/27/2022 15:35:40

## 2022-04-04 ENCOUNTER — Ambulatory Visit (HOSPITAL_BASED_OUTPATIENT_CLINIC_OR_DEPARTMENT_OTHER): Payer: Medicare Other | Admitting: General Surgery

## 2022-04-06 ENCOUNTER — Encounter (HOSPITAL_BASED_OUTPATIENT_CLINIC_OR_DEPARTMENT_OTHER): Payer: Medicare Other | Attending: General Surgery | Admitting: General Surgery

## 2022-04-06 DIAGNOSIS — M1990 Unspecified osteoarthritis, unspecified site: Secondary | ICD-10-CM | POA: Insufficient documentation

## 2022-04-06 DIAGNOSIS — I2510 Atherosclerotic heart disease of native coronary artery without angina pectoris: Secondary | ICD-10-CM | POA: Insufficient documentation

## 2022-04-06 DIAGNOSIS — I890 Lymphedema, not elsewhere classified: Secondary | ICD-10-CM | POA: Insufficient documentation

## 2022-04-06 DIAGNOSIS — I10 Essential (primary) hypertension: Secondary | ICD-10-CM | POA: Insufficient documentation

## 2022-04-06 DIAGNOSIS — L97811 Non-pressure chronic ulcer of other part of right lower leg limited to breakdown of skin: Secondary | ICD-10-CM | POA: Diagnosis present

## 2022-04-06 DIAGNOSIS — L97822 Non-pressure chronic ulcer of other part of left lower leg with fat layer exposed: Secondary | ICD-10-CM | POA: Insufficient documentation

## 2022-04-06 DIAGNOSIS — Z7952 Long term (current) use of systemic steroids: Secondary | ICD-10-CM | POA: Diagnosis not present

## 2022-04-06 DIAGNOSIS — I872 Venous insufficiency (chronic) (peripheral): Secondary | ICD-10-CM | POA: Diagnosis not present

## 2022-04-06 DIAGNOSIS — L97511 Non-pressure chronic ulcer of other part of right foot limited to breakdown of skin: Secondary | ICD-10-CM | POA: Diagnosis not present

## 2022-04-06 DIAGNOSIS — B1910 Unspecified viral hepatitis B without hepatic coma: Secondary | ICD-10-CM | POA: Diagnosis not present

## 2022-04-08 NOTE — Progress Notes (Signed)
LAZARIAH, SCOLLON ([id number]) [id number] Page 1 of 10 Visit Report for 04/06/2022 Chief Complaint Document Details Patient Name: Date of Service: [name], [name] 04/06/2022 10:00 A M Medical Record Number: [id number] Patient Account Number: [id number] Date of Birth/Sex: Treating RN: 02-07-1930 (89 y.o. F) Primary Care Provider: [name] Other Clinician: Referring Provider: Treating Provider/Extender: Jethro Bolus in Treatment: 2 Information Obtained from: Patient Chief Complaint Patient presents for treatment of open ulcers due to venous insufficiency Electronic Signature(s) Signed: 04/06/2022 12:19:00 PM By: Fredirick Maudlin MD FACS Entered By: Fredirick Maudlin on 04/06/2022 12:19:00 -------------------------------------------------------------------------------- Debridement Details Patient Name: Date of Service: [name], [name] 04/06/2022 10:00 A M Medical Record Number: [id number] Patient Account Number: [id number] Date of Birth/Sex: Treating RN: 1930-09-09 (89 y.o. [name] Primary Care Provider: [name] Other Clinician: Referring Provider: Treating Provider/Extender: Jethro Bolus in Treatment: 2 Debridement Performed for Assessment: Wound #5 Left,Lateral Knee Performed By: Physician Fredirick Maudlin, MD Debridement Type: Debridement Level of Consciousness (Pre-procedure): Awake and Alert Pre-procedure Verification/Time Out Yes - 11:45 Taken: Start Time: 11:45 Pain Control: Lidocaine 5% topical ointment T Area Debrided (L x W): otal 4 (cm) x 2 (cm) = 8 (cm) Tissue and other material debrided: Non-Viable, Eschar, Slough, Skin: Dermis , Skin: Epidermis, Slough Level: Skin/Epidermis Debridement Description: Selective/Open Wound Instrument: Curette Bleeding: Minimum Hemostasis Achieved: Pressure End Time: 11:47 Procedural Pain: 0 Post Procedural Pain: 0 Response to  Treatment: Procedure was tolerated well Level of Consciousness (Post- Awake and Alert procedure): Post Debridement Measurements of Total Wound Length: (cm) 4 Width: (cm) 2 Depth: (cm) 0.1 Volume: (cm) 0.628 Character of Wound/Ulcer Post Debridement: Improved Post Procedure Diagnosis Same as Pre-procedure Notes Scribed for Dr. Celine Ahr by J.Scotton Electronic Signature(s) Signed: 04/06/2022 1:02:33 PM By: Fredirick Maudlin MD FACS Signed: 04/06/2022 5:49:17 PM By: Dellie Catholic RN Conception Oms, [place] ([id number]) [phone].pdf Page 2 of 10 Entered By: Dellie Catholic on 04/06/2022 11:56:10 -------------------------------------------------------------------------------- Debridement Details Patient Name: Date of Service: [name], [name] 04/06/2022 10:00 A M Medical Record Number: [id number] Patient Account Number: [id number] Date of Birth/Sex: Treating RN: 05-18-1930 (89 y.o. [name], [name] Primary Care Provider: [name] Other Clinician: Referring Provider: Treating Provider/Extender: Jethro Bolus in Treatment: 2 Debridement Performed for Assessment: Wound #1 Left,Distal,Lateral Lower Leg Performed By: Physician Fredirick Maudlin, MD Debridement Type: Debridement Level of Consciousness (Pre-procedure): Awake and Alert Pre-procedure Verification/Time Out Yes - 11:45 Taken: Start Time: 11:45 Pain Control: Lidocaine 5% topical ointment T Area Debrided (L x W): otal 1.8 (cm) x 0.5 (cm) = 0.9 (cm) Tissue and other material debrided: Non-Viable, Eschar, Slough, Skin: Dermis , Skin: Epidermis, Slough Level: Skin/Epidermis Debridement Description: Selective/Open Wound Instrument: Curette Bleeding: Minimum Hemostasis Achieved: Pressure End Time: 11:47 Procedural Pain: 0 Post Procedural Pain: 0 Response to Treatment: Procedure was tolerated well Level of Consciousness (Post- Awake and Alert procedure): Post Debridement  Measurements of Total Wound Length: (cm) 1.8 Width: (cm) 0.5 Depth: (cm) 0.3 Volume: (cm) 0.212 Character of Wound/Ulcer Post Debridement: Improved Post Procedure Diagnosis Same as Pre-procedure Notes Scribed for Dr. Celine Ahr by J.Scotton Electronic Signature(s) Signed: 04/06/2022 1:02:33 PM By: Fredirick Maudlin MD FACS Signed: 04/06/2022 5:49:17 PM By: Dellie Catholic RN Entered By: Dellie Catholic on 04/06/2022 11:58:26 -------------------------------------------------------------------------------- HPI Details Patient Name: Date of Service: [name], [name] 04/06/2022 10:00 A M Medical Record Number: [id number] Patient Account Number: [id number] Date of Birth/Sex: Treating RN: [birth year] (89 y.o. F) Primary Care Provider: [name],  [name] Other Clinician: Referring Provider: Treating Provider/Extender: Jethro Bolus in Treatment: 2 History of Present Illness HPI Description: ADMISSION 03/20/2022 This is an 89 year old woman with coronary artery disease, on chronic corticosteroids for osteoarthritis, who presents with bilateral lower leg ulcers. They have been present for a couple of weeks. I do not have any vascular studies available to discern whether or not she has had venous reflux or arterial studies done. ABIs in clinic were 1.34 on the left and 0.95 on the right. She is not diabetic. She does not smoke. Her legs are quite edematous and she has multiple hematomas of various sizes and ages extending up to at least the distal thigh. She has 3 ulcers on the left leg with slough and eschar accumulation. On the right leg, she has a tiny superficial ulcer on the anterior tibial surface. She also has a small ulcer on the lateral [place], [place] ([id number]) [id number] Page 3 of 10 aspect of her right third toe. 03/27/2022: The ulcer on her right anterior tibial surface and lateral third toe are both healed. The ulcers on her left leg are  smaller but have slough accumulation. Edema control is good. Her juxta lite stockings arrived in the mail, but she did not bring them with her today. 04/06/2022: She has a new skin tear just distal to her left knee. The ulcer on her left upper posterior calf is nearly healed under a layer of eschar. She has a spot on her right second toe that she is concerned about, saying that she frequently gets an ulcer there, but I do not see any open wound in this site. Electronic Signature(s) Signed: 04/06/2022 12:20:21 PM By: Fredirick Maudlin MD FACS Entered By: Fredirick Maudlin on 04/06/2022 12:20:21 -------------------------------------------------------------------------------- Physical Exam Details Patient Name: Date of Service: [name], [name] 04/06/2022 10:00 A M Medical Record Number: [id number] Patient Account Number: [id number] Date of Birth/Sex: Treating RN: December 25, 1930 (89 y.o. F) Primary Care Provider: [name] Other Clinician: Referring Provider: Treating Provider/Extender: Jethro Bolus in Treatment: 2 Constitutional . . . . no acute distress. Respiratory Normal work of breathing on room air. Notes 04/06/2022: She has a new skin tear just distal to her left knee. The ulcer on her left upper posterior calf is nearly healed under a layer of eschar. She has a spot on her right second toe that she is concerned about, saying that she frequently gets an ulcer there, but I do not see any open wound in this site. Electronic Signature(s) Signed: 04/06/2022 12:23:08 PM By: Fredirick Maudlin MD FACS Entered By: Fredirick Maudlin on 04/06/2022 12:23:08 -------------------------------------------------------------------------------- Physician Orders Details Patient Name: Date of Service: [name], [name] 04/06/2022 10:00 A M Medical Record Number: [id number] Patient Account Number: [id number] Date of Birth/Sex: Treating RN: [birth year] (89 y.o. [name] Primary  Care Provider: [name] Other Clinician: Referring Provider: Treating Provider/Extender: Jethro Bolus in Treatment: 2 Verbal / Phone Orders: No Diagnosis Coding ICD-10 Coding Code Description L97.811 Non-pressure chronic ulcer of other part of right lower leg limited to breakdown of skin L97.822 Non-pressure chronic ulcer of other part of left lower leg with fat layer exposed L97.511 Non-pressure chronic ulcer of other part of right foot limited to breakdown of skin R60.0 Localized edema I87.2 Venous insufficiency (chronic) (peripheral) Z79.52 Long term (current) use of systemic steroids Follow-up Appointments ppointment in 1 week. - Dr. Celine Ahr - Room 3 Return A Friday March 15th at 11am  Anesthetic (In clinic) Topical Lidocaine 4% applied to wound bed Bathing/ Shower/ Hygiene May shower with protection but do not get wound dressing(s) wet. Protect dressing(s) with water repellant cover (for example, large plastic bag) or a cast cover and may then take shower. Linda, [name] ([id number]) [id number] Page 4 of 10 Edema Control - Lymphedema / SCD / Other Elevate legs to the level of the heart or above for 30 minutes daily and/or when sitting for 3-4 times a day throughout the day. Avoid standing for long periods of time. Non Wound Condition Other Non Wound Condition Orders/Instructions: - 3 layer compression wrap to right leg Wound Treatment Wound #1 - Lower Leg Wound Laterality: Left, Lateral, Distal Cleanser: Soap and Water 1 x Per Week/30 Days Discharge Instructions: May shower and wash wound with dial antibacterial soap and water prior to dressing change. Cleanser: Wound Cleanser 1 x Per Week/30 Days Discharge Instructions: Cleanse the wound with wound cleanser prior to applying a clean dressing using gauze sponges, not tissue or cotton balls. Peri-Wound Care: Sween Lotion (Moisturizing lotion) 1 x Per Week/30  Days Discharge Instructions: Apply moisturizing lotion as directed Prim Dressing: Sorbalgon AG Dressing 2x2 (in/in) 1 x Per Week/30 Days ary Discharge Instructions: Apply to wound bed as instructed Secondary Dressing: Woven Gauze Sponge, Non-Sterile 4x4 in 1 x Per Week/30 Days Discharge Instructions: Apply over primary dressing as directed. Compression Wrap: ThreePress (3 layer compression wrap) 1 x Per Week/30 Days Discharge Instructions: Apply three layer compression as directed. Wound #2 - Lower Leg Wound Laterality: Left, Lateral, Proximal Cleanser: Soap and Water 1 x Per Week/30 Days Discharge Instructions: May shower and wash wound with dial antibacterial soap and water prior to dressing change. Cleanser: Wound Cleanser 1 x Per Week/30 Days Discharge Instructions: Cleanse the wound with wound cleanser prior to applying a clean dressing using gauze sponges, not tissue or cotton balls. Peri-Wound Care: Sween Lotion (Moisturizing lotion) 1 x Per Week/30 Days Discharge Instructions: Apply moisturizing lotion as directed Prim Dressing: Sorbalgon AG Dressing 2x2 (in/in) 1 x Per Week/30 Days ary Discharge Instructions: Apply to wound bed as instructed Secondary Dressing: Woven Gauze Sponge, Non-Sterile 4x4 in 1 x Per Week/30 Days Discharge Instructions: Apply over primary dressing as directed. Compression Wrap: ThreePress (3 layer compression wrap) 1 x Per Week/30 Days Discharge Instructions: Apply three layer compression as directed. Wound #5 - Knee Wound Laterality: Left, Lateral Cleanser: Soap and Water 1 x Per Week/30 Days Discharge Instructions: May shower and wash wound with dial antibacterial soap and water prior to dressing change. Cleanser: Wound Cleanser 1 x Per Week/30 Days Discharge Instructions: Cleanse the wound with wound cleanser prior to applying a clean dressing using gauze sponges, not tissue or cotton balls. Peri-Wound Care: Sween Lotion (Moisturizing lotion) 1 x Per  Week/30 Days Discharge Instructions: Apply moisturizing lotion as directed Prim Dressing: Sorbalgon AG Dressing 2x2 (in/in) 1 x Per Week/30 Days ary Discharge Instructions: Apply to wound bed as instructed Secondary Dressing: Woven Gauze Sponge, Non-Sterile 4x4 in 1 x Per Week/30 Days Discharge Instructions: Apply over primary dressing as directed. Compression Wrap: ThreePress (3 layer compression wrap) 1 x Per Week/30 Days Discharge Instructions: Apply three layer compression as directed. Electronic Signature(s) Signed: 04/06/2022 1:02:33 PM By: Fredirick Maudlin MD FACS Entered By: Fredirick Maudlin on 04/06/2022 12:23:48 Problem List Details -------------------------------------------------------------------------------- [name] ([id number]) [id number] Page 5 of 10 Patient Name: Date of Service: [name], [name] 04/06/2022 10:00 A M Medical Record Number: [id number] Patient Account Number: [id number]  Date of Birth/Sex: Treating RN: [birth year] (89 y.o. F) Primary Care Provider: [name] Other Clinician: Referring Provider: Treating Provider/Extender: Jethro Bolus in Treatment: 2 Active Problems ICD-10 Encounter Code Description Active Date MDM Diagnosis L97.811 Non-pressure chronic ulcer of other part of right lower leg limited to breakdown 03/20/2022 No Yes of skin L97.822 Non-pressure chronic ulcer of other part of left lower leg with fat layer exposed2/19/2024 No Yes L97.511 Non-pressure chronic ulcer of other part of right foot limited to breakdown of 03/20/2022 No Yes skin R60.0 Localized edema 03/20/2022 No Yes I87.2 Venous insufficiency (chronic) (peripheral) 03/20/2022 No Yes Z79.52 Long term (current) use of systemic steroids 03/20/2022 No Yes Inactive Problems Resolved Problems Electronic Signature(s) Signed: 04/06/2022 12:18:44 PM By: Fredirick Maudlin MD FACS Entered By: Fredirick Maudlin on 04/06/2022  12:18:43 -------------------------------------------------------------------------------- Progress Note Details Patient Name: Date of Service: [name], [name] 04/06/2022 10:00 A M Medical Record Number: [id number] Patient Account Number: [id number] Date of Birth/Sex: Treating RN: [birth year] (89 y.o. F) Primary Care Provider: [name] Other Clinician: Referring Provider: Treating Provider/Extender: Jethro Bolus in Treatment: 2 Subjective Chief Complaint Information obtained from Patient Patient presents for treatment of open ulcers due to venous insufficiency History of Present Illness (HPI) ADMISSION 03/20/2022 This is an 89 year old woman with coronary artery disease, on chronic corticosteroids for osteoarthritis, who presents with bilateral lower leg ulcers. They have been present for a couple of weeks. I do not have any vascular studies available to discern whether or not she has had venous reflux or arterial studies done. ABIs in clinic were 1.34 on the left and 0.95 on the right. She is not diabetic. She does not smoke. Her legs are quite edematous and she has multiple hematomas of various sizes and ages extending up to at least the distal thigh. She has 3 ulcers on the left leg with slough and eschar accumulation. On the right leg, she has a tiny superficial ulcer on the anterior tibial surface. She also has a small ulcer on the lateral aspect of her right third toe. THEODORE, BARTNICKI ([id number]) [id number] Page 6 of 10 03/27/2022: The ulcer on her right anterior tibial surface and lateral third toe are both healed. The ulcers on her left leg are smaller but have slough accumulation. Edema control is good. Her juxta lite stockings arrived in the mail, but she did not bring them with her today. 04/06/2022: She has a new skin tear just distal to her left knee. The ulcer on her left upper posterior calf is nearly healed under a  layer of eschar. She has a spot on her right second toe that she is concerned about, saying that she frequently gets an ulcer there, but I do not see any open wound in this site. Patient History Information obtained from Patient. Family History Heart Disease - Father, Hypertension - Mother, No family history of Cancer, Diabetes, Hereditary Spherocytosis, Kidney Disease, Lung Disease, Seizures, Stroke, Thyroid Problems, Tuberculosis. Social History Never smoker, Marital Status - Married, Alcohol Use - Rarely, Drug Use - No History, Caffeine Use - Moderate. 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 - cardiac catheterization. - cataract extraction bilat.. - colonoscopy. - bilateral total knee replacement. - upper GI  endoscopy. Medical A Surgical History Notes nd Gastrointestinal diverticulosis, GERD Musculoskeletal osteoporosis Objective Constitutional no acute distress. Vitals Time Taken: 11:26 AM, Height: 68 in, Weight: 180 lbs, BMI: 27.4, Temperature: 97.5 F, Pulse: 61 bpm, Respiratory Rate: 18 breaths/min, Blood Pressure: 123/75 mmHg. Respiratory Normal work of breathing on room air. General Notes: 04/06/2022: She has a new skin tear just distal to her left knee. The ulcer on her left upper posterior calf is nearly healed under a layer of eschar. She has a spot on her right second toe that she is concerned about, saying that she frequently gets an ulcer there, but I do not see any open wound in this site. Integumentary (Hair, Skin) Wound #1 status is Open. Original cause of wound was Gradually Appeared. The date  acquired was: 03/02/2022. The wound has been in treatment 2 weeks. The wound is located on the Left,Distal,Lateral Lower Leg. The wound measures 1.8cm length x 0.5cm width x 0.3cm depth; 0.707cm^2 area and 0.212cm^3 volume. There is Fat Layer (Subcutaneous Tissue) exposed. There is no tunneling or undermining noted. There is a medium amount of serosanguineous drainage noted. The wound margin is distinct with the outline attached to the wound base. There is small (1-33%) red granulation within the wound bed. There is a large (67-100%) amount of necrotic tissue within the wound bed including Adherent Slough. The periwound skin appearance had no abnormalities noted for moisture. The periwound skin appearance exhibited: Scarring, Hemosiderin Staining. The periwound skin appearance did not exhibit: Rubor. Periwound temperature was noted as No Abnormality. Wound #2 status is Open. Original cause of wound was Gradually Appeared. The date acquired was: 03/02/2022. The wound has been in treatment 2 weeks. The wound is located on the Left,Proximal,Lateral Lower Leg. The wound measures 0.3cm length x 0.2cm width x 0.1cm depth; 0.047cm^2 area and 0.005cm^3 volume. There is Fat Layer (Subcutaneous Tissue) exposed. There is no tunneling or undermining noted. There is a medium amount of serosanguineous drainage noted. The wound margin is distinct with the outline attached to the wound base. There is small (1-33%) red granulation within the wound bed. There is a large (67-100%) amount of necrotic tissue within the wound bed including Eschar. The periwound skin appearance had no abnormalities noted for texture. The periwound skin appearance had no abnormalities noted for moisture. The periwound skin appearance exhibited: Hemosiderin Staining, Rubor. Periwound temperature was noted as No Abnormality. Wound #5 status is Open. Original cause of wound was Skin T ear/Laceration. The date acquired was: 04/03/2022. The wound is  located on the Left,Lateral Knee. The wound measures 4cm length x 2cm width x 0.1cm depth; 6.283cm^2 area and 0.628cm^3 volume. There is no tunneling or undermining noted. There is a medium amount of serosanguineous drainage noted. There is small (1-33%) pink granulation within the wound bed. There is a large (67-100%) amount of necrotic tissue within the wound bed including Eschar and Adherent Slough. The periwound skin appearance had no abnormalities noted for moisture. The periwound skin appearance had no abnormalities noted for color. The periwound skin appearance exhibited: Scarring. Periwound temperature was noted as No Abnormality. Linda, [name] ([id number]) [id number] Page 7 of 10 Wound #6 status is Healed - Epithelialized. Original cause of wound was Other Lesion. The date acquired was: 04/03/2022. The wound is located on the Right,Medial T Second. The wound measures 0cm length x 0cm width x 0cm depth; 0cm^2 area and 0cm^3 volume. There is no tunneling or undermining oe noted. There is a none present amount of drainage noted.  There is no granulation within the wound bed. There is no necrotic tissue within the wound bed. The periwound skin appearance had no abnormalities noted for texture. The periwound skin appearance had no abnormalities noted for moisture. The periwound skin appearance had no abnormalities noted for color. Periwound temperature was noted as No Abnormality. 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 There was a Selective/Open Wound Skin/Epidermis Debridement with a total area of 0.9 sq cm performed by Fredirick Maudlin, MD. With the following instrument(s): Curette to remove Non-Viable tissue/material. Material removed includes Eschar, [place], Skin: Dermis, and Skin: Epidermis after achieving pain control using Lidocaine 5% topical ointment. No specimens were taken. A time out was conducted at 11:45, prior to the start of the procedure. A Minimum amount of bleeding was controlled with Pressure. The procedure was tolerated well with a pain level of 0 throughout and a pain level of 0 following the procedure. Post Debridement Measurements: 1.8cm length x 0.5cm width x 0.3cm depth; 0.212cm^3 volume. Character of Wound/Ulcer Post Debridement is improved. Post procedure Diagnosis Wound #1: Same as Pre-Procedure General Notes: Scribed for Dr. Celine Ahr by J.Scotton. Wound #5 Pre-procedure diagnosis of Wound #5 is a Trauma, Other located on the Left,Lateral Knee . There was a Selective/Open Wound Skin/Epidermis Debridement with a total area of 8 sq cm performed by Fredirick Maudlin, MD. With the following instrument(s): Curette to remove Non-Viable tissue/material. Material removed includes Eschar, [place], Skin: Dermis, and Skin: Epidermis after achieving pain control using Lidocaine 5% topical ointment. No specimens were taken. A time out was conducted at 11:45, prior to the start of the procedure. A Minimum amount of bleeding was controlled with Pressure. The procedure was tolerated well with a pain level of 0 throughout and a pain level of 0 following the procedure. Post Debridement Measurements: 4cm length x 2cm width x 0.1cm depth; 0.628cm^3 volume. Character of Wound/Ulcer Post Debridement is improved. Post procedure Diagnosis Wound #5: Same as Pre-Procedure General Notes: Scribed for Dr. Celine Ahr by J.Scotton. Plan Follow-up Appointments: Return Appointment in 1  week. - Dr. Celine Ahr - Room 3 Friday March 15th at 11am Anesthetic: (In clinic) Topical Lidocaine 4% applied to wound bed Bathing/ Shower/ Hygiene: May shower with protection but do not get wound dressing(s) wet. Protect dressing(s) with water repellant cover (for example, large plastic bag) or a cast cover and may then take shower. Edema Control - Lymphedema / SCD / Other: Elevate legs to the level of the heart or above for 30 minutes daily and/or when sitting for 3-4 times a day throughout the day. Avoid standing for long periods of time. Non Wound Condition: Other Non Wound Condition Orders/Instructions: - 3 layer compression wrap to right leg WOUND #1: - Lower Leg Wound Laterality: Left, Lateral, Distal Cleanser: Soap and Water 1 x Per Week/30 Days Discharge Instructions: May shower and wash wound with dial antibacterial soap and water prior to dressing change. Cleanser: Wound Cleanser 1 x Per Week/30 Days Discharge Instructions: Cleanse the wound with wound cleanser prior to applying a clean dressing using gauze sponges, not tissue or cotton balls.  Peri-Wound Care: Sween Lotion (Moisturizing lotion) 1 x Per Week/30 Days Discharge Instructions: Apply moisturizing lotion as directed Prim Dressing: Sorbalgon AG Dressing 2x2 (in/in) 1 x Per Week/30 Days ary Discharge Instructions: Apply to wound bed as instructed Secondary Dressing: Woven Gauze Sponge, Non-Sterile 4x4 in 1 x Per Week/30 Days Discharge Instructions: Apply over primary dressing as directed. Com pression Wrap: ThreePress (3 layer compression wrap) 1 x Per Week/30 Days Discharge Instructions: Apply three layer compression as directed. WOUND #2: - Lower Leg Wound Laterality: Left, Lateral, Proximal Cleanser: Soap and Water 1 x Per Week/30 Days Discharge Instructions: May shower and wash wound with dial antibacterial soap and water prior to dressing change. Cleanser: Wound Cleanser 1 x Per Week/30 Days Discharge Instructions:  Cleanse the wound with wound cleanser prior to applying a clean dressing using gauze sponges, not tissue or cotton balls. Peri-Wound Care: Sween Lotion (Moisturizing lotion) 1 x Per Week/30 Days Linda, [name] ([id number]) [id number] Page 8 of 10 Discharge Instructions: Apply moisturizing lotion as directed Prim Dressing: Sorbalgon AG Dressing 2x2 (in/in) 1 x Per Week/30 Days ary Discharge Instructions: Apply to wound bed as instructed Secondary Dressing: Woven Gauze Sponge, Non-Sterile 4x4 in 1 x Per Week/30 Days Discharge Instructions: Apply over primary dressing as directed. Com pression Wrap: ThreePress (3 layer compression wrap) 1 x Per Week/30 Days Discharge Instructions: Apply three layer compression as directed. WOUND #5: - Knee Wound Laterality: Left, Lateral Cleanser: Soap and Water 1 x Per Week/30 Days Discharge Instructions: May shower and wash wound with dial antibacterial soap and water prior to dressing change. Cleanser: Wound Cleanser 1 x Per Week/30 Days Discharge Instructions: Cleanse the wound with wound cleanser prior to applying a clean dressing using gauze sponges, not tissue or cotton balls. Peri-Wound Care: Sween Lotion (Moisturizing lotion) 1 x Per Week/30 Days Discharge Instructions: Apply moisturizing lotion as directed Prim Dressing: Sorbalgon AG Dressing 2x2 (in/in) 1 x Per Week/30 Days ary Discharge Instructions: Apply to wound bed as instructed Secondary Dressing: Woven Gauze Sponge, Non-Sterile 4x4 in 1 x Per Week/30 Days Discharge Instructions: Apply over primary dressing as directed. Com pression Wrap: ThreePress (3 layer compression wrap) 1 x Per Week/30 Days Discharge Instructions: Apply three layer compression as directed. 04/06/2022: She has a new skin tear just distal to her left knee. The ulcer on her left upper posterior calf is nearly healed under a layer of eschar. She has a spot on her right second toe that she is  concerned about, saying that she frequently gets an ulcer there, but I do not see any open wound in this site. I used scissors and forceps to trim hanging loose skin from the new skin tear. I used a curette to debride slough and a bit of old hematoma from that location. I debrided eschar from the left upper posterior calf wound and slough and eschar from the left lower posterior calf wound. We will continue silver alginate to all sites. Will put some padding between her right second toe and her great toe to minimize friction and hopefully avoid opening the wound here. Continue 3 layer compression. Follow-up in 1 week. Electronic Signature(s) Signed: 04/06/2022 12:25:07 PM By: Fredirick Maudlin MD FACS Entered By: Fredirick Maudlin on 04/06/2022 12:25:06 -------------------------------------------------------------------------------- HxROS Details Patient Name: Date of Service: [name], [name] 04/06/2022 10:00 A M Medical Record Number: [id number] Patient Account Number: [id number] Date of Birth/Sex: Treating RN: 1930-04-22 (89 y.o. F) Primary Care Provider: [name] Other Clinician: Referring Provider: Treating Provider/Extender: Celine Ahr  Rogelia Rohrer, Esperanza Richters in Treatment: 2 Information Obtained From Patient Eyes Medical History: Positive for: Cataracts Hematologic/Lymphatic Medical History: Positive for: Lymphedema Respiratory Medical History: Positive for: Sleep Apnea Cardiovascular Medical History: Positive for: Coronary Artery Disease; Hypertension Gastrointestinal Medical History: Positive for: Hepatitis B - 50+ yrs ago Past Medical History Notes: diverticulosis, GERD [place], Carlton ([id number]) [id number] Page 9 of 10 Musculoskeletal Medical History: Positive for: Osteoarthritis Past Medical History Notes: osteoporosis HBO Extended History Items Eyes: Cataracts Immunizations Pneumococcal Vaccine: Received Pneumococcal  Vaccination: Yes Received Pneumococcal Vaccination On or After 60th Birthday: Yes Implantable Devices No devices added Hospitalization / Surgery History Type of Hospitalization/Surgery abdominal hysterectomy cardiac catheterization cataract extraction bilat. colonoscopy bilateral total knee replacement upper GI endoscopy Family and Social History Cancer: No; Diabetes: No; Heart Disease: Yes - Father; Hereditary Spherocytosis: No; Hypertension: Yes - Mother; Kidney Disease: No; Lung Disease: No; Seizures: No; Stroke: No; Thyroid Problems: No; Tuberculosis: No; Never smoker; Marital Status - Married; Alcohol Use: Rarely; Drug Use: No History; Caffeine Use: Moderate; Financial Concerns: No; Food, Clothing or Shelter Needs: No; Support System Lacking: No; Transportation Concerns: No Electronic Signature(s) Signed: 04/06/2022 1:02:33 PM By: Fredirick Maudlin MD FACS Entered By: Fredirick Maudlin on 04/06/2022 12:20:45 -------------------------------------------------------------------------------- SuperBill Details Patient Name: Date of Service: [name], [name] 04/06/2022 Medical Record Number: [id number] Patient Account Number: [id number] Date of Birth/Sex: Treating RN: May 21, 1930 (89 y.o. F) Primary Care Provider: [name] Other Clinician: Referring Provider: Treating Provider/Extender: Jethro Bolus in Treatment: 2 Diagnosis Coding ICD-10 Codes Code Description [phone] Non-pressure chronic ulcer of other part of right lower leg limited to breakdown of skin L97.822 Non-pressure chronic ulcer of other part of left lower leg with fat layer exposed L97.511 Non-pressure chronic ulcer of other part of right foot limited to breakdown of skin R60.0 Localized edema I87.2 Venous insufficiency (chronic) (peripheral) Z79.52 Long term (current) use of systemic steroids Facility Procedures : CPT4 Code: [id number] Description: [id number] - DEBRIDE WOUND 1ST 20 SQ CM OR <  ICD-10 Diagnosis Description L97.822 Non-pressure chronic ulcer of other part of left lower leg with fat layer expose Modifier: d Quantity: 1 Physician Procedures : CPT4 Code Description Modifier [id number] - WC PHYS LEVEL 3 - EST PT 25 Rost, Keonna ([id number]) [id number] ICD-10 Diagnosis Description L97.822 Non-pressure chronic ulcer of other part of left lower leg with fat layer  exposed R60.0 Localized edema Z79.52 Long term (current) use of systemic steroids I87.2 Venous insufficiency (chronic) (peripheral) Quantity: 1 .pdf Page 10 of 10 : [id number] - WC PHYS DEBR WO ANESTH 20 SQ CM 1 ICD-10 Diagnosis Description L97.822 Non-pressure chronic ulcer of other part of left lower leg with fat layer exposed Quantity: Electronic Signature(s) Signed: 04/06/2022 12:26:30 PM By: Fredirick Maudlin MD FACS Entered By: Fredirick Maudlin on 04/06/2022 12:26:30

## 2022-04-08 NOTE — Progress Notes (Signed)
[place], [name] ([id number]) [id number] Page 1 of 10 Visit Report for 04/06/2022 Arrival Information Details Patient Name: Date of Service: [name], [name] RO L 04/06/2022 10:00 A M Medical Record Number: [id number] Patient Account Number: [id number] Date of Birth/Sex: Treating RN: September 05, 1930 (89 y.o. [name] Primary Care Zyair Russi: [name] Other Clinician: Referring Sumayyah Custodio: Treating Eulamae Greenstein/Extender: Jethro Bolus in Treatment: 2 Visit Information History Since Last Visit Added or deleted any medications: No Patient Arrived: [name] Any new allergies or adverse reactions: No Arrival Time: 11:25 Had a fall or experienced change in No Accompanied By: husband activities of daily living that may affect Transfer Assistance: Manual risk of falls: Patient Identification Verified: Yes Signs or symptoms of abuse/neglect since last visito No Patient Has Alerts: No Hospitalized since last visit: No Implantable device outside of the clinic excluding No cellular tissue based products placed in the center since last visit: Has Dressing in Place as Prescribed: Yes Has Compression in Place as Prescribed: Yes Pain Present Now: No Electronic Signature(s) Signed: 04/06/2022 5:49:17 PM By: Dellie Catholic RN Entered By: Dellie Catholic on 04/06/2022 11:26:06 -------------------------------------------------------------------------------- Encounter Discharge Information Details Patient Name: Date of Service: [name], [name] 04/06/2022 10:00 A M Medical Record Number: [id number] Patient Account Number: [id number] Date of Birth/Sex: Treating RN: 1930-06-11 (89 y.o. [name] Primary Care Shooter Tangen: [name] Other Clinician: Referring Keland Peyton: Treating Myna Freimark/Extender: Jethro Bolus in Treatment: 2 Encounter Discharge Information Items Post Procedure Vitals Discharge Condition:  Stable Temperature (F): 97.5 Ambulatory Status: Walker Pulse (bpm): 61 Discharge Destination: Home Respiratory Rate (breaths/min): 18 Transportation: Private Auto Blood Pressure (mmHg): 123/75 Accompanied By: spouse Schedule Follow-up Appointment: Yes Clinical Summary of Care: Patient Declined Electronic Signature(s) Signed: 04/06/2022 5:49:17 PM By: Dellie Catholic RN Entered By: Dellie Catholic on 04/06/2022 17:48:54 -------------------------------------------------------------------------------- Lower Extremity Assessment Details Patient Name: Date of Service: [name], [name] 04/06/2022 10:00 A M Medical Record Number: [id number] Patient Account Number: [id number] Date of Birth/Sex: Treating RN: Aug 24, 1930 (89 y.o. [name] Primary Care Catriona Dillenbeck: [name] Other Clinician: Referring Wilburt Messina: Treating Eden Toohey/Extender: Jethro Bolus in Treatment: 2 Edema Assessment S[Left: [name] [id number] Patrice [id number].pdf Page 2 of 10] Assessed: [Left: No] [Right: No] [Left: Edema] [Right: :] Calf Left: Right: Point of Measurement: From Medial Instep 34 cm 34.5 cm Ankle Left: Right: Point of Measurement: From Medial Instep 21 cm 21 cm Vascular Assessment Pulses: Dorsalis Pedis Palpable: [Left:Yes] [Right:Yes] Electronic Signature(s) Signed: 04/06/2022 5:49:17 PM By: Dellie Catholic RN Entered By: Dellie Catholic on 04/06/2022 11:49:42 -------------------------------------------------------------------------------- Multi Wound Chart Details Patient Name: Date of Service: [name], [name] 04/06/2022 10:00 A M Medical Record Number: [id number] Patient Account Number: [id number] Date of Birth/Sex: Treating RN: April 23, 1930 (89 y.o. F) Primary Care Jowanna Loeffler: [name] Other Clinician: Referring Willet Schleifer: Treating Darianny Momon/Extender: Jethro Bolus in Treatment: 2 Vital  Signs Height(in): 37 Pulse(bpm): 7 Weight(lbs): 180 Blood Pressure(mmHg): 123/75 Body Mass Index(BMI): 27.4 Temperature(F): 97.5 Respiratory Rate(breaths/min): 18 [1:Photos:] Left, Distal, Lateral Lower Leg Left, Proximal, Lateral Lower Leg Left, Lateral Knee Wound Location: Gradually Appeared Gradually Appeared Skin Tear/Laceration Wounding Event: Lymphedema Lymphedema Trauma, Other Primary Etiology: Cataracts, Lymphedema, Sleep Cataracts, Lymphedema, Sleep Cataracts, Lymphedema, Sleep Comorbid History: Apnea, Coronary Artery Disease, Apnea, Coronary Artery Disease, Apnea, Coronary Artery Disease, Hypertension, Hepatitis B, Hypertension, Hepatitis B, Hypertension, Hepatitis B, Osteoarthritis Osteoarthritis Osteoarthritis 03/02/2022 03/02/2022 04/03/2022 Date Acquired: 2 2 0 Weeks of Treatment: Open Open Open Wound Status: No  No No Wound Recurrence: 1.8x0.5x0.3 0.3x0.2x0.1 4x2x0.1 Measurements L x W x D (cm) 0.707 0.047 6.283 A (cm) : rea 0.212 0.005 0.628 Volume (cm) : -50.10% 98.50% N/A % Reduction in Area: -125.50% 98.40% N/A % Reduction in Volume: Full Thickness Without Exposed Full Thickness Without Exposed Full Thickness Without Exposed Classification: Support Structures Support Structures Support Structures Medium Medium Medium Exudate Amount: Serosanguineous Serosanguineous Serosanguineous Exudate Type: red, [name] red, [name] red, [name] Exudate Color: Distinct, outline attached Distinct, outline attached N/A Wound Margin: Small (1-33%) Small (1-33%) Small (1-33%) Granulation Amount: Red Red Pink Granulation QualityASSYRIA, [name] ([id number].pdf Page 3 of 10 Large (67-100%) Large (67-100%) Large (67-100%) Necrotic Amount: Adherent Slough Eschar Eschar, Adherent Slough Necrotic Tissue: Fat Layer (Subcutaneous Tissue): Yes Fat Layer (Subcutaneous Tissue): Yes Fascia: No Exposed Structures: Fascia: No Fascia: No Fat  Layer (Subcutaneous Tissue): No Tendon: No Tendon: No Tendon: No Muscle: No Muscle: No Muscle: No Joint: No Joint: No Joint: No Bone: No Bone: No Bone: No Small (1-33%) Small (1-33%) None Epithelialization: Debridement - Selective/Open Wound N/A Debridement - Selective/Open Wound Debridement: Pre-procedure Verification/Time Out 11:45 N/A 11:45 Taken: Lidocaine 5% topical ointment N/A Lidocaine 5% topical ointment Pain Control: Necrotic/Eschar, Slough N/A Necrotic/Eschar, Slough Tissue Debrided: Skin/Epidermis N/A Skin/Epidermis Level: 0.9 N/A 8 Debridement A (sq cm): rea Curette N/A Curette Instrument: Minimum N/A Minimum Bleeding: Pressure N/A Pressure Hemostasis A chieved: 0 N/A 0 Procedural Pain: 0 N/A 0 Post Procedural Pain: Procedure was tolerated well N/A Procedure was tolerated well Debridement Treatment Response: 1.8x0.5x0.3 N/A 4x2x0.1 Post Debridement Measurements L x W x D (cm) 0.212 N/A 0.628 Post Debridement Volume: (cm) Scarring: Yes No Abnormalities Noted Scarring: Yes Periwound Skin Texture: No Abnormalities Noted No Abnormalities Noted No Abnormalities Noted Periwound Skin Moisture: Hemosiderin Staining: Yes Hemosiderin Staining: Yes No Abnormalities Noted Periwound Skin Color: Rubor: No Rubor: Yes No Abnormality No Abnormality No Abnormality Temperature: Debridement N/A Debridement Procedures Performed: Wound Number: 6 N/A N/A Photos: N/A N/A Right, Medial T Second oe N/A N/A Wound Location: Other Lesion N/A N/A Wounding Event: Abrasion N/A N/A Primary Etiology: Cataracts, Lymphedema, Sleep N/A N/A Comorbid History: Apnea, Coronary Artery Disease, Hypertension, Hepatitis B, Osteoarthritis 04/03/2022 N/A N/A Date Acquired: 0 N/A N/A Weeks of Treatment: Healed - Epithelialized N/A N/A Wound Status: No N/A N/A Wound Recurrence: 0x0x0 N/A N/A Measurements L x W x D (cm) 0 N/A N/A A (cm) : rea 0 N/A N/A Volume (cm)  : N/A N/A N/A % Reduction in Area: N/A N/A N/A % Reduction in Volume: Full Thickness Without Exposed N/A N/A Classification: Support Structures None Present N/A N/A Exudate A mount: N/A N/A N/A Exudate Type: N/A N/A N/A Exudate Color: N/A N/A N/A Wound Margin: None Present (0%) N/A N/A Granulation Amount: N/A N/A N/A Granulation Quality: None Present (0%) N/A N/A Necrotic Amount: N/A N/A N/A Necrotic Tissue: Fascia: No N/A N/A Exposed Structures: Fat Layer (Subcutaneous Tissue): No Tendon: No Muscle: No Joint: No Bone: No None N/A N/A Epithelialization: N/A N/A N/A Debridement: N/A N/A N/A Pain Control: N/A N/A N/A Tissue Debrided: N/A N/A N/A Level: N/A N/A N/A Debridement A (sq cm): rea N/A N/A N/A Instrument: N/A N/A N/A Bleeding: N/A N/A N/A Hemostasis A chieved: N/A N/A N/A Procedural Pain: N/A N/A N/A Post Procedural PainTaisha [name], [name] ([id number].pdf Page 4 of 10 N/A N/A N/A Debridement Treatment Response: N/A N/A N/A Post Debridement Measurements L x W x D (cm) N/A N/A N/A Post Debridement Volume: (cm) No Abnormalities Noted  N/A N/A Periwound Skin Texture: No Abnormalities Noted N/A N/A Periwound Skin Moisture: No Abnormalities Noted N/A N/A Periwound Skin Color: No Abnormality N/A N/A Temperature: N/A N/A N/A Procedures Performed: Treatment Notes Electronic Signature(s) Signed: 04/06/2022 12:18:53 PM By: Fredirick Maudlin MD FACS Entered By: Fredirick Maudlin on 04/06/2022 12:18:53 -------------------------------------------------------------------------------- Pain Assessment Details Patient Name: Date of Service: [name], [name] 04/06/2022 10:00 A M Medical Record Number: [id number] Patient Account Number: [id number] Date of Birth/Sex: Treating RN: Jun 13, 1930 (89 y.o. [name] Primary Care Maki Hege: [name] Other Clinician: Referring Sylvester Salonga: Treating Torina Ey/Extender:  Jethro Bolus in Treatment: 2 Active Problems Location of Pain Severity and Description of Pain Patient Has Paino No Site Locations Pain Management and Medication Current Pain Management: Electronic Signature(s) Signed: 04/06/2022 5:49:17 PM By: Dellie Catholic RN Entered By: Dellie Catholic on 04/06/2022 11:48:52 -------------------------------------------------------------------------------- Wound Assessment Details Patient Name: Date of Service: [name], [name] 04/06/2022 10:00 A M Medical Record Number: [id number] Patient Account Number: [id number] Date of Birth/Sex: Treating RN: [birth year] (89 y.o. [name] Primary Care Hiroko Tregre: [name] Other Clinician: Referring Chamya Hunton: Treating Marimar Suber/Extender: Jethro Bolus in Treatment: 2 Wound Status Wound Number: 1 Primary Lymphedema Etiology: BRIELA, MICA ([id number] Page 5 of 10 Etiology: Wound Location: Left, Distal, Lateral Lower Leg Wound Open Wounding Event: Gradually Appeared Status: Date Acquired: 03/02/2022 Comorbid Cataracts, Lymphedema, Sleep Apnea, Coronary Artery Disease, Weeks Of Treatment: 2 History: Hypertension, Hepatitis B, Osteoarthritis Clustered Wound: No Photos Wound Measurements Length: (cm) 1.8 Width: (cm) 0.5 Depth: (cm) 0.3 Area: (cm) 0.707 Volume: (cm) 0.212 % Reduction in Area: -50.1% % Reduction in Volume: -125.5% Epithelialization: Small (1-33%) Tunneling: No Undermining: No Wound Description Classification: Full Thickness Without Exposed Support Structures Wound Margin: Distinct, outline attached Exudate Amount: Medium Exudate Type: Serosanguineous Exudate Color: red, [name] Foul Odor After Cleansing: No Slough/Fibrino Yes Wound Bed Granulation Amount: Small (1-33%) Exposed Structure Granulation Quality: Red Fascia Exposed: No Necrotic Amount: Large (67-100%) Fat Layer  (Subcutaneous Tissue) Exposed: Yes Necrotic Quality: Adherent Slough Tendon Exposed: No Muscle Exposed: No Joint Exposed: No Bone Exposed: No Periwound Skin Texture Texture Color No Abnormalities Noted: No No Abnormalities Noted: No Scarring: Yes Hemosiderin Staining: Yes Rubor: No Moisture No Abnormalities Noted: Yes Temperature / Pain Temperature: No Abnormality Treatment Notes Wound #1 (Lower Leg) Wound Laterality: Left, Lateral, Distal Cleanser Soap and Water Discharge Instruction: May shower and wash wound with dial antibacterial soap and water prior to dressing change. Wound Cleanser Discharge Instruction: Cleanse the wound with wound cleanser prior to applying a clean dressing using gauze sponges, not tissue or cotton balls. Peri-Wound Care Sween Lotion (Moisturizing lotion) Discharge Instruction: Apply moisturizing lotion as directed Topical Primary Dressing Sorbalgon AG Dressing 2x2 (in/in) Discharge Instruction: Apply to wound bed as instructed Secondary Dressing Woven Gauze Sponge, Non-Sterile 4x4 in [place], [name] ([id number]) [id number].pdf Page 6 of 10 Discharge Instruction: Apply over primary dressing as directed. Secured With Compression Wrap ThreePress (3 layer compression wrap) Discharge Instruction: Apply three layer compression as directed. Compression Stockings Add-Ons Electronic Signature(s) Signed: 04/06/2022 5:49:17 PM By: Dellie Catholic RN Entered By: Dellie Catholic on 04/06/2022 11:18:52 -------------------------------------------------------------------------------- Wound Assessment Details Patient Name: Date of Service: [name], [name] 04/06/2022 10:00 A M Medical Record Number: [id number] Patient Account Number: [id number] Date of Birth/Sex: Treating RN: 25-Jan-1931 (89 y.o. [name] Primary Care Nesa Distel: [name] Other Clinician: Referring Gaytha Raybourn: Treating Lorren Splawn/Extender: Jethro Bolus in Treatment: 2 Wound  Status Wound Number: 2 Primary Lymphedema Etiology: Wound Location: Left, Proximal, Lateral Lower Leg Wound Open Wounding Event: Gradually Appeared Status: Date Acquired: 03/02/2022 Comorbid Cataracts, Lymphedema, Sleep Apnea, Coronary Artery Disease, Weeks Of Treatment: 2 History: Hypertension, Hepatitis B, Osteoarthritis Clustered Wound: No Photos Wound Measurements Length: (cm) 0.3 Width: (cm) 0.2 Depth: (cm) 0.1 Area: (cm) 0.047 Volume: (cm) 0.005 % Reduction in Area: 98.5% % Reduction in Volume: 98.4% Epithelialization: Small (1-33%) Tunneling: No Undermining: No Wound Description Classification: Full Thickness Without Exposed Support Structures Wound Margin: Distinct, outline attached Exudate Amount: Medium Exudate Type: Serosanguineous Exudate Color: red, [name] Foul Odor After Cleansing: No Slough/Fibrino Yes Wound Bed Granulation Amount: Small (1-33%) Exposed Structure Granulation Quality: Red Fascia Exposed: No Necrotic Amount: Large (67-100%) Fat Layer (Subcutaneous Tissue) Exposed: Yes Necrotic Quality: Eschar Tendon Exposed: No Muscle Exposed: No Joint Exposed: No Bone Exposed: No [address] [place], [name] ([id number].pdf Page 7 of 10 Texture Color No Abnormalities Noted: Yes No Abnormalities Noted: No Hemosiderin Staining: Yes Moisture Rubor: Yes No Abnormalities Noted: Yes Temperature / Pain Temperature: No Abnormality Treatment Notes Wound #2 (Lower Leg) Wound Laterality: Left, Lateral, Proximal Cleanser Soap and Water Discharge Instruction: May shower and wash wound with dial antibacterial soap and water prior to dressing change. Wound Cleanser Discharge Instruction: Cleanse the wound with wound cleanser prior to applying a clean dressing using gauze sponges, not tissue or cotton balls. Peri-Wound Care Sween Lotion (Moisturizing  lotion) Discharge Instruction: Apply moisturizing lotion as directed Topical Primary Dressing Sorbalgon AG Dressing 2x2 (in/in) Discharge Instruction: Apply to wound bed as instructed Secondary Dressing Woven Gauze Sponge, Non-Sterile 4x4 in Discharge Instruction: Apply over primary dressing as directed. Secured With Compression Wrap ThreePress (3 layer compression wrap) Discharge Instruction: Apply three layer compression as directed. Compression Stockings Add-Ons Electronic Signature(s) Signed: 04/06/2022 5:49:17 PM By: Dellie Catholic RN Entered By: Dellie Catholic on 04/06/2022 11:19:22 -------------------------------------------------------------------------------- Wound Assessment Details Patient Name: Date of Service: [name], [name] 04/06/2022 10:00 A M Medical Record Number: [id number] Patient Account Number: [id number] Date of Birth/Sex: Treating RN: June 30, 1930 (89 y.o. [name] Primary Care Jaya Lapka: [name] Other Clinician: Referring Caralina Nop: Treating Rasheda Ledger/Extender: Jethro Bolus in Treatment: 2 Wound Status Wound Number: 5 Primary Trauma, Other Etiology: Wound Location: Left, Lateral Knee Wound Open Wounding Event: Skin Tear/Laceration Status: Date Acquired: 04/03/2022 Comorbid Cataracts, Lymphedema, Sleep Apnea, Coronary Artery Disease, Weeks Of Treatment: 0 History: Hypertension, Hepatitis B, Osteoarthritis Clustered Wound: No Photos [place], [name] ([id number]) [id number] Page 8 of 10 Wound Measurements Length: (cm) 4 Width: (cm) 2 Depth: (cm) 0.1 Area: (cm) 6.283 Volume: (cm) 0.628 % Reduction in Area: % Reduction in Volume: Epithelialization: None Tunneling: No Undermining: No Wound Description Classification: Full Thickness Without Exposed Support Structures Exudate Amount: Medium Exudate Type: Serosanguineous Exudate Color: red, [name] Foul Odor After Cleansing:  No Slough/Fibrino Yes Wound Bed Granulation Amount: Small (1-33%) Exposed Structure Granulation Quality: Pink Fascia Exposed: No Necrotic Amount: Large (67-100%) Fat Layer (Subcutaneous Tissue) Exposed: No Necrotic Quality: Eschar, Adherent Slough Tendon Exposed: No Muscle Exposed: No Joint Exposed: No Bone Exposed: No Periwound Skin Texture Texture Color No Abnormalities Noted: No No Abnormalities Noted: Yes Scarring: Yes Temperature / Pain Temperature: No Abnormality Moisture No Abnormalities Noted: Yes Treatment Notes Wound #5 (Knee) Wound Laterality: Left, Lateral Cleanser Soap and Water Discharge Instruction: May shower and wash wound with dial antibacterial soap and water prior to dressing change. Wound Cleanser Discharge Instruction: Cleanse the wound with wound cleanser prior  to applying a clean dressing using gauze sponges, not tissue or cotton balls. Peri-Wound Care Sween Lotion (Moisturizing lotion) Discharge Instruction: Apply moisturizing lotion as directed Topical Primary Dressing Sorbalgon AG Dressing 2x2 (in/in) Discharge Instruction: Apply to wound bed as instructed Secondary Dressing Woven Gauze Sponge, Non-Sterile 4x4 in Discharge Instruction: Apply over primary dressing as directed. Secured With Compression Wrap ThreePress (3 layer compression wrap) Discharge Instruction: Apply three layer compression as directed. Compression Stockings [place], [place] ([id number]) [id number] Page 9 of 10 Add-Ons Electronic Signature(s) Signed: 04/06/2022 5:49:17 PM By: Dellie Catholic RN Entered By: Dellie Catholic on 04/06/2022 11:19:57 -------------------------------------------------------------------------------- Wound Assessment Details Patient Name: Date of Service: [name], [name] 04/06/2022 10:00 A M Medical Record Number: [id number] Patient Account Number: [id number] Date of Birth/Sex: Treating RN: May 25, 1930 (89 y.o. [name] Primary Care Diar Berkel: [name] Other Clinician: Referring Kayliegh Boyers: Treating Gerard Cantara/Extender: Jethro Bolus in Treatment: 2 Wound Status Wound Number: 6 Primary Abrasion Etiology: Wound Location: Right, Medial T Second oe Wound Healed - Epithelialized Wounding Event: Other Lesion Status: Date Acquired: 04/03/2022 Comorbid Cataracts, Lymphedema, Sleep Apnea, Coronary Artery Disease, Weeks Of Treatment: 0 History: Hypertension, Hepatitis B, Osteoarthritis Clustered Wound: No Photos Wound Measurements Length: (cm) Width: (cm) Depth: (cm) Area: (cm) Volume: (cm) 0 % Reduction in Area: 0 % Reduction in Volume: 0 Epithelialization: None 0 Tunneling: No 0 Undermining: No Wound Description Classification: Full Thickness Without Exposed Support Exudate Amount: None Present Structures Foul Odor After Cleansing: No Slough/Fibrino No Wound Bed Granulation Amount: None Present (0%) Exposed Structure Necrotic Amount: None Present (0%) Fascia Exposed: No Fat Layer (Subcutaneous Tissue) Exposed: No Tendon Exposed: No Muscle Exposed: No Joint Exposed: No Bone Exposed: No Periwound Skin Texture Texture Color No Abnormalities Noted: Yes No Abnormalities Noted: Yes Moisture Temperature / Pain No Abnormalities Noted: Yes Temperature: No Abnormality Electronic Signature(s) Signed: 04/06/2022 5:49:17 PM By: Dellie Catholic RN Entered By: Dellie Catholic on 04/06/2022 12:05:04 [name] ([id number].pdf Page 10 of 10 -------------------------------------------------------------------------------- Vitals Details Patient Name: Date of Service: [name], [name] RO L 04/06/2022 10:00 A M Medical Record Number: [id number] Patient Account Number: [id number] Date of Birth/Sex: Treating RN: 1930/05/14 (89 y.o. [name] Primary Care Idora Brosious: [name] Other Clinician: Referring Klara Stjames: Treating  Phillipa Morden/Extender: Jethro Bolus in Treatment: 2 Vital Signs Time Taken: 11:26 Temperature (F): 97.5 Height (in): 68 Pulse (bpm): 61 Weight (lbs): 180 Respiratory Rate (breaths/min): 18 Body Mass Index (BMI): 27.4 Blood Pressure (mmHg): 123/75 Reference Range: 80 - 120 mg / dl Electronic Signature(s) Signed: 04/06/2022 5:49:17 PM By: Dellie Catholic RN Entered By: Dellie Catholic on 04/06/2022 11:27:02

## 2022-04-13 ENCOUNTER — Encounter (HOSPITAL_BASED_OUTPATIENT_CLINIC_OR_DEPARTMENT_OTHER): Payer: Medicare Other | Admitting: Internal Medicine

## 2022-04-13 DIAGNOSIS — L97811 Non-pressure chronic ulcer of other part of right lower leg limited to breakdown of skin: Secondary | ICD-10-CM | POA: Diagnosis not present

## 2022-04-14 ENCOUNTER — Ambulatory Visit (HOSPITAL_BASED_OUTPATIENT_CLINIC_OR_DEPARTMENT_OTHER): Payer: Medicare Other | Admitting: Internal Medicine

## 2022-04-14 NOTE — Progress Notes (Signed)
ADYLYNN, GANS ([id number]) [id number] Page 1 of 1 Visit Report for 04/13/2022 SuperBill Details Patient Name: Date of Service: [name], [place] RO Carlean Jews 04/13/2022 Medical Record Number: [id number] Patient Account Number: [id number] Date of Birth/Sex: Treating RN: September 08, 1930 (89 y.o. [name] Primary Care Provider: [name] Other Clinician: Referring Provider: Treating Provider/Extender: Marguerita Merles in Treatment: 3 Diagnosis Coding ICD-10 Codes Code Description [phone] Non-pressure chronic ulcer of other part of right lower leg limited to breakdown of skin L97.822 Non-pressure chronic ulcer of other part of left lower leg with fat layer exposed L97.511 Non-pressure chronic ulcer of other part of right foot limited to breakdown of skin R60.0 Localized edema I87.2 Venous insufficiency (chronic) (peripheral) Z79.52 Long term (current) use of systemic steroids Facility Procedures CPT4 Description Modifier Quantity Code [id number] BILATERAL: Application of multi-layer venous compression system; leg (below knee), including ankle and 1 foot. ICD-10 Diagnosis Description L97.811 Non-pressure chronic ulcer of other part of right lower leg limited to breakdown of skin Electronic Signature(s) Signed: 04/13/2022 3:34:03 PM By: Kalman Shan DO Signed: 04/13/2022 4:01:22 PM By: [name] Entered By: [name] on 04/13/2022 11:28:11

## 2022-04-14 NOTE — Progress Notes (Signed)
CHASTA, DAUN ([id number]) [id number] Page 1 of 6 Visit Report for 04/13/2022 Arrival Information Details Patient Name: Date of Service: [name], [name] 04/13/2022 11:00 A M Medical Record Number: [id number] Patient Account Number: [id number] Date of Birth/Sex: Treating RN: 1930-12-22 (89 y.o. [name], [name] Primary Care [name]: [name] Other Clinician: Referring [name]: Treating [place]opal/Extender: Marguerita Merles in Treatment: 3 Visit Information History Since Last Visit Added or deleted any medications: No Patient Arrived: [name] Any new allergies or adverse reactions: No Arrival Time: 11:01 Had a fall or experienced change in No Accompanied By: husband activities of daily living that may affect Transfer Assistance: Manual risk of falls: Patient Identification Verified: Yes Signs or symptoms of abuse/neglect since last visito No Secondary Verification Process Completed: Yes Hospitalized since last visit: No Patient Has Alerts: No Implantable device outside of the clinic excluding No cellular tissue based products placed in the center since last visit: Has Dressing in Place as Prescribed: Yes Has Compression in Place as Prescribed: Yes Pain Present Now: No Electronic Signature(s) Signed: 04/13/2022 4:01:22 PM By: [name] Entered By: [name] on 04/13/2022 11:01:59 -------------------------------------------------------------------------------- Compression Therapy Details Patient Name: Date of Service: [name], [name] 04/13/2022 11:00 [place] Record Number: [id number] Patient Account Number: [id number] Date of Birth/Sex: Treating RN: [birth year] (89 y.o. [name] Primary Care Daysi Boggan: [name] Other Clinician: Referring Teira Arcilla: Treating Karletta Millay/Extender: Marguerita Merles in Treatment: 3 Compression Therapy Performed for Wound  Assessment: Wound #1 Left,Distal,Lateral Lower Leg Performed By: Clinician [name], RN Compression Type: Three Layer Electronic Signature(s) Signed: 04/13/2022 4:01:22 PM By: [name] Entered By: [name] on 04/13/2022 11:27:16 -------------------------------------------------------------------------------- Compression Therapy Details Patient Name: Date of Service: [name], [name] 04/13/2022 11:00 A M Medical Record Number: [id number] Patient Account Number: [id number] Date of Birth/Sex: Treating RN: February 07, 1930 (89 y.o. [name] Primary Care Eriana Suliman: [name] Other Clinician: Referring Ellajane Stong: Treating Jadis Mika/Extender: Marguerita Merles in Treatment: 3 Compression Therapy Performed for Wound Assessment: NonWound Condition Lymphedema - Right Leg Performed By: Clinician [name], RN Compression Type: Three Layer Electronic Signature(s) Signed: 04/13/2022 4:01:22 PM By: [name] Entered By: [name] on 04/13/2022 11:27:25 [name] ([id number]) [id number] Page 2 of 6 -------------------------------------------------------------------------------- Encounter Discharge Information Details Patient Name: Date of Service: [name], [name] 04/13/2022 11:00 A M Medical Record Number: [id number] Patient Account Number: [id number] Date of Birth/Sex: Treating RN: [birth year] (89 y.o. [name] Primary Care Parley Pidcock: [name] Other Clinician: Referring Lenyx Boody: Treating Malayiah Mcbrayer/Extender: Marguerita Merles in Treatment: 3 Encounter Discharge Information Items Discharge Condition: Stable Ambulatory Status: Walker Discharge Destination: Home Transportation: Private Auto Accompanied By: husband Schedule Follow-up Appointment: Yes Clinical Summary of Care: Patient Declined Electronic Signature(s) Signed: 04/13/2022  4:01:22 PM By: [name] Entered By: [name] on 04/13/2022 11:27:52 -------------------------------------------------------------------------------- Patient/Caregiver Education Details Patient Name: Date of Service: [name], [name] 3/14/2024andnbsp11:00 [place] Number: [id number] Patient Account Number: [id number] Date of Birth/Gender: Treating RN: 1930/03/30 (89 y.o. [name] Primary Care Physician: [name] Other Clinician: Referring Physician: Treating Physician/Extender: Marguerita Merles in Treatment: 3 Education Assessment Education Provided To: Patient Education Topics Provided Wound/Skin Impairment: Methods: Explain/Verbal Responses: Reinforcements needed, State content correctly Electronic Signature(s) Signed: 04/13/2022 4:01:22 PM By: [name] Entered By: [name] on 04/13/2022 11:27:41 -------------------------------------------------------------------------------- Wound Assessment Details Patient Name: Date of Service: [name], [name] 04/13/2022  11:00 A M Medical Record Number: [id number] Patient Account Number: [id number] Date of Birth/Sex: Treating RN: October 22, 1930 (89 y.o. [name] Primary Care Drevin Ortner: [name] Other Clinician: Referring Brylynn Hanssen: Treating Jarriel Papillion/Extender: Marguerita Merles in Treatment: 3 Wound Status Wound Number: 1 Primary Lymphedema Etiology: Wound Location: Left, Distal, Lateral Lower Leg Wound Open Wounding Event: Gradually Appeared Status: Date Acquired: 03/02/2022 Comorbid Cataracts, Lymphedema, Sleep Apnea, Coronary Artery Weeks Of Treatment: 3 History: Disease, Hypertension, Hepatitis B, Osteoarthritis Clustered Wound: No [name], [name] ([id number]) [id number] Page 3 of 6 Wound Measurements Length: (cm) 1.8 Width: (cm) 0.5 Depth: (cm) 0.3 Area: (cm) 0.707 Volume:  (cm) 0.212 % Reduction in Area: -50.1% % Reduction in Volume: -125.5% Epithelialization: Small (1-33%) Wound Description Classification: Full Thickness Without Exposed Support Structures Wound Margin: Distinct, outline attached Exudate Amount: Medium Exudate Type: Serosanguineous Exudate Color: red, brown Foul Odor After Cleansing: No Slough/Fibrino Yes Wound Bed Granulation Amount: Small (1-33%) Exposed Structure Granulation Quality: Red Fascia Exposed: No Necrotic Amount: Large (67-100%) Fat Layer (Subcutaneous Tissue) Exposed: Yes Necrotic Quality: Adherent Slough Tendon Exposed: No Muscle Exposed: No Joint Exposed: No Bone Exposed: No Periwound Skin Texture Texture Color No Abnormalities Noted: No No Abnormalities Noted: No Scarring: Yes Hemosiderin Staining: Yes Rubor: No Moisture No Abnormalities Noted: Yes Temperature / Pain Temperature: No Abnormality Treatment Notes Wound #1 (Lower Leg) Wound Laterality: Left, Lateral, Distal Cleanser Soap and Water Discharge Instruction: May shower and wash wound with dial antibacterial soap and water prior to dressing change. Wound Cleanser Discharge Instruction: Cleanse the wound with wound cleanser prior to applying a clean dressing using gauze sponges, not tissue or cotton balls. Peri-Wound Care Sween Lotion (Moisturizing lotion) Discharge Instruction: Apply moisturizing lotion as directed Topical Primary Dressing Sorbalgon AG Dressing 2x2 (in/in) Discharge Instruction: Apply to wound bed as instructed Secondary Dressing Woven Gauze Sponge, Non-Sterile 4x4 in Discharge Instruction: Apply over primary dressing as directed. Secured With Compression Wrap ThreePress (3 layer compression wrap) Discharge Instruction: Apply three layer compression as directed. Compression Stockings Add-Ons Electronic Signature(s) Signed: 04/13/2022 4:01:22 PM By: [name] Entered By: [name] on 04/13/2022  11:26:58 [name] ([id number]) [id number] Page 4 of 6 -------------------------------------------------------------------------------- Wound Assessment Details Patient Name: Date of Service: [name], [name] 04/13/2022 11:00 A M Medical Record Number: [id number] Patient Account Number: [id number] Date of Birth/Sex: Treating RN: 06-Oct-1930 (89 y.o. [name] Primary Care Kellar Westberg: [name] Other Clinician: Referring Rachelann Enloe: Treating Statia Burdick/Extender: Marguerita Merles in Treatment: 3 Wound Status Wound Number: 2 Primary Lymphedema Etiology: Wound Location: Left, Proximal, Lateral Lower Leg Wound Open Wounding Event: Gradually Appeared Status: Date Acquired: 03/02/2022 Comorbid Cataracts, Lymphedema, Sleep Apnea, Coronary Artery Weeks Of Treatment: 3 History: Disease, Hypertension, Hepatitis B, Osteoarthritis Clustered Wound: No Wound Measurements Length: (cm) 0.3 Width: (cm) 0.2 Depth: (cm) 0.1 Area: (cm) 0.047 Volume: (cm) 0.005 % Reduction in Area: 98.5% % Reduction in Volume: 98.4% Epithelialization: Small (1-33%) Wound Description Classification: Full Thickness Without Exposed Support Structures Wound Margin: Distinct, outline attached Exudate Amount: Medium Exudate Type: Serosanguineous Exudate Color: red, brown Foul Odor After Cleansing: No Slough/Fibrino Yes Wound Bed Granulation Amount: Small (1-33%) Exposed Structure Granulation Quality: Red Fascia Exposed: No Necrotic Amount: Large (67-100%) Fat Layer (Subcutaneous Tissue) Exposed: Yes Necrotic Quality: Eschar Tendon Exposed: No Muscle Exposed: No Joint Exposed: No Bone Exposed: No Periwound Skin Texture Texture Color No Abnormalities Noted: Yes No Abnormalities Noted: No Hemosiderin Staining: Yes Moisture Rubor: Yes No Abnormalities Noted: Yes Temperature / Pain  Temperature: No Abnormality Treatment Notes Wound #2 (Lower  Leg) Wound Laterality: Left, Lateral, Proximal Cleanser Soap and Water Discharge Instruction: May shower and wash wound with dial antibacterial soap and water prior to dressing change. Wound Cleanser Discharge Instruction: Cleanse the wound with wound cleanser prior to applying a clean dressing using gauze sponges, not tissue or cotton balls. Peri-Wound Care Sween Lotion (Moisturizing lotion) Discharge Instruction: Apply moisturizing lotion as directed Topical Primary Dressing Sorbalgon AG Dressing 2x2 (in/in) Discharge Instruction: Apply to wound bed as instructed Secondary Dressing Woven Gauze Sponge, Non-Sterile 4x4 in Discharge Instruction: Apply over primary dressing as directed. [name], [name] ([id number]) [id number] Page 5 of 6 Secured With Compression Wrap ThreePress (3 layer compression wrap) Discharge Instruction: Apply three layer compression as directed. Compression Stockings Add-Ons Electronic Signature(s) Signed: 04/13/2022 4:01:22 PM By: [name] Entered By: [name] on 04/13/2022 11:27:02 -------------------------------------------------------------------------------- Wound Assessment Details Patient Name: Date of Service: [name], [name] 04/13/2022 11:00 A M Medical Record Number: [id number] Patient Account Number: [id number] Date of Birth/Sex: Treating RN: 21-Oct-1930 (89 y.o. [name] Primary Care Daris Harkins: [name] Other Clinician: Referring Wallice Granville: Treating Latima Hamza/Extender: Marguerita Merles in Treatment: 3 Wound Status Wound Number: 5 Primary Trauma, Other Etiology: Wound Location: Left, Lateral Knee Wound Open Wounding Event: Skin Tear/Laceration Status: Date Acquired: 04/03/2022 Comorbid Cataracts, Lymphedema, Sleep Apnea, Coronary Artery Weeks Of Treatment: 1 History: Disease, Hypertension, Hepatitis B, Osteoarthritis Clustered Wound: No Wound  Measurements Length: (cm) 4 Width: (cm) 2 Depth: (cm) 0.1 Area: (cm) 6.283 Volume: (cm) 0.628 % Reduction in Area: 0% % Reduction in Volume: 0% Epithelialization: None Wound Description Classification: Full Thickness Without Exposed Suppor Exudate Amount: Medium Exudate Type: Serosanguineous Exudate Color: red, brown t Structures Foul Odor After Cleansing: No Slough/Fibrino Yes Wound Bed Granulation Amount: Small (1-33%) Exposed Structure Granulation Quality: Pink Fascia Exposed: No Necrotic Amount: Large (67-100%) Fat Layer (Subcutaneous Tissue) Exposed: No Necrotic Quality: Eschar, Adherent Slough Tendon Exposed: No Muscle Exposed: No Joint Exposed: No Bone Exposed: No Periwound Skin Texture Texture Color No Abnormalities Noted: No No Abnormalities Noted: Yes Scarring: Yes Temperature / Pain Temperature: No Abnormality Moisture No Abnormalities Noted: Yes Treatment Notes Wound #5 (Knee) Wound Laterality: Left, Lateral Cleanser Soap and Water Discharge Instruction: May shower and wash wound with dial antibacterial soap and water prior to dressing change. Wound Cleanser Discharge Instruction: Cleanse the wound with wound cleanser prior to applying a clean dressing using gauze sponges, not tissue or cotton balls. [name], [name] ([id number]) [id number] Page 6 of 6 Peri-Wound Care Sween Lotion (Moisturizing lotion) Discharge Instruction: Apply moisturizing lotion as directed Topical Primary Dressing Sorbalgon AG Dressing 2x2 (in/in) Discharge Instruction: Apply to wound bed as instructed Secondary Dressing Woven Gauze Sponge, Non-Sterile 4x4 in Discharge Instruction: Apply over primary dressing as directed. Secured With Compression Wrap ThreePress (3 layer compression wrap) Discharge Instruction: Apply three layer compression as directed. Compression Stockings Add-Ons Electronic Signature(s) Signed: 04/13/2022 4:01:22 PM By:  [name] Entered By: [name] on 04/13/2022 11:27:05 -------------------------------------------------------------------------------- Vitals Details Patient Name: Date of Service: [name], [name] 04/13/2022 11:00 A M Medical Record Number: [id number] Patient Account Number: [id number] Date of Birth/Sex: Treating RN: 1930-08-13 (89 y.o. [name] Primary Care Brandn Mcgath: [name] Other Clinician: Referring Tron Flythe: Treating Zylee Marchiano/Extender: Marguerita Merles in Treatment: 3 Vital Signs Time Taken: 11:02 Temperature (F): 97.7 Height (in): 68 Pulse (bpm): 75 Weight (lbs): 180 Respiratory Rate (breaths/min): 16 Body Mass Index (BMI): 27.4 Blood Pressure (  mmHg): 166/76 Reference Range: 80 - 120 mg / dl Electronic Signature(s) Signed: 04/13/2022 4:01:22 PM By: [name] Entered By: [name] on 04/13/2022 11:02:23

## 2022-04-21 ENCOUNTER — Encounter (HOSPITAL_BASED_OUTPATIENT_CLINIC_OR_DEPARTMENT_OTHER): Payer: Medicare Other | Admitting: General Surgery

## 2022-04-21 DIAGNOSIS — L97811 Non-pressure chronic ulcer of other part of right lower leg limited to breakdown of skin: Secondary | ICD-10-CM | POA: Diagnosis not present

## 2022-04-22 NOTE — Progress Notes (Signed)
DAIZA, PRITZL ([id number]) [id number] Page 1 of 8 Visit Report for 04/21/2022 Chief Complaint Document Details Patient Name: Date of Service: [name], [name] 04/21/2022 7:45 A M Medical Record Number: [id number] Patient Account Number: [id number] Date of Birth/Sex: Treating RN: 04/12/1930 (89 y.o. F) Primary Care Provider: [name] Other Clinician: Referring Provider: Treating Provider/Extender: Jethro Bolus in Treatment: 4 Information Obtained from: Patient Chief Complaint Patient presents for treatment of open ulcers due to venous insufficiency Electronic Signature(s) Signed: 04/21/2022 8:24:18 AM By: Fredirick Maudlin MD FACS Entered By: Fredirick Maudlin on 04/21/2022 08:24:18 -------------------------------------------------------------------------------- Debridement Details Patient Name: Date of Service: [name], [name] 04/21/2022 7:45 A M Medical Record Number: [id number] Patient Account Number: [id number] Date of Birth/Sex: Treating RN: 27-Sep-1930 (89 y.o. [name] Primary Care Provider: [name] Other Clinician: Referring Provider: Treating Provider/Extender: Jethro Bolus in Treatment: 4 Debridement Performed for Assessment: Wound #2 Left,Proximal,Lateral Lower Leg Performed By: Physician Fredirick Maudlin, MD Debridement Type: Debridement Level of Consciousness (Pre-procedure): Awake and Alert Pre-procedure Verification/Time Out Yes - 08:09 Taken: Start Time: 08:09 T Area Debrided (L x W): otal 0.4 (cm) x 0.4 (cm) = 0.16 (cm) Tissue and other material debrided: Non-Viable, Eschar Level: Non-Viable Tissue Debridement Description: Selective/Open Wound Instrument: Curette Bleeding: Minimum Hemostasis Achieved: Pressure Response to Treatment: Procedure was tolerated well Level of Consciousness (Post- Awake and Alert procedure): Post Debridement Measurements of  Total Wound Length: (cm) 0.1 Width: (cm) 0.1 Depth: (cm) 0.1 Volume: (cm) 0.001 Character of Wound/Ulcer Post Debridement: Improved Post Procedure Diagnosis Same as Pre-procedure Notes scribed for Dr. Celine Ahr by [name], RN Electronic Signature(s) Signed: 04/21/2022 1:35:04 PM By: Fredirick Maudlin MD FACS Signed: 04/21/2022 3:34:35 PM By: [name] Entered By: [name] on 04/21/2022 08:10:12 [name] ([id number]) [id number] Page 2 of 8 -------------------------------------------------------------------------------- HPI Details Patient Name: Date of Service: [name], [name] 04/21/2022 7:45 A M Medical Record Number: [id number] Patient Account Number: [id number] Date of Birth/Sex: Treating RN: Apr 05, 1930 (89 y.o. F) Primary Care Provider: [name] Other Clinician: Referring Provider: Treating Provider/Extender: Jethro Bolus in Treatment: 4 History of Present Illness HPI Description: ADMISSION 03/20/2022 This is an 89 year old woman with coronary artery disease, on chronic corticosteroids for osteoarthritis, who presents with bilateral lower leg ulcers. They have been present for a couple of weeks. I do not have any vascular studies available to discern whether or not she has had venous reflux or arterial studies done. ABIs in clinic were 1.34 on the left and 0.95 on the right. She is not diabetic. She does not smoke. Her legs are quite edematous and she has multiple hematomas of various sizes and ages extending up to at least the distal thigh. She has 3 ulcers on the left leg with slough and eschar accumulation. On the right leg, she has a tiny superficial ulcer on the anterior tibial surface. She also has a small ulcer on the lateral aspect of her right third toe. 03/27/2022: The ulcer on her right anterior tibial surface and lateral third toe are both healed. The ulcers on her left leg are  smaller but have slough accumulation. Edema control is good. Her juxta lite stockings arrived in the mail, but she did not bring them with her today. 04/06/2022: She has a new skin tear just distal to her left knee. The ulcer on her left upper posterior calf is nearly healed under a layer of eschar. She has a spot on her  right second toe that she is concerned about, saying that she frequently gets an ulcer there, but I do not see any open wound in this site. 04/21/2022: All of her wounds are healed except for the skin tear distal to her left knee. There is a layer of eschar on the surface. Underneath, the wound is quite superficial and clean. Electronic Signature(s) Signed: 04/21/2022 8:24:56 AM By: Fredirick Maudlin MD FACS Entered By: Fredirick Maudlin on 04/21/2022 08:24:56 -------------------------------------------------------------------------------- Physical Exam Details Patient Name: Date of Service: [name], [name] 04/21/2022 7:45 A M Medical Record Number: [id number] Patient Account Number: [id number] Date of Birth/Sex: Treating RN: Jan 01, 1931 (89 y.o. F) Primary Care Provider: [name] Other Clinician: Referring Provider: Treating Provider/Extender: Jethro Bolus in Treatment: 4 Constitutional Slightly hypertensive. . . . no acute distress. Respiratory Normal work of breathing on room air. Notes 04/21/2022: All of her wounds are healed except for the skin tear distal to her left knee. There is a layer of eschar on the surface. Underneath, the wound is quite superficial and clean. Electronic Signature(s) Signed: 04/21/2022 8:25:22 AM By: Fredirick Maudlin MD FACS Entered By: Fredirick Maudlin on 04/21/2022 08:25:21 -------------------------------------------------------------------------------- Physician Orders Details Patient Name: Date of Service: [name], [name] 04/21/2022 7:45 A M Medical Record Number: [id number] Patient Account Number:  [id number] Date of Birth/Sex: Treating RN: 1930/06/18 (89 y.o. [name] Primary Care Provider: [name] Other Clinician: Referring Provider: Treating Provider/Extender: Jethro Bolus in Treatment: 4 Verbal / Phone Orders: Margaretmary Dys ([id number]) [id number] Page 3 of 8 Diagnosis Coding ICD-10 Coding Code Description [phone] Non-pressure chronic ulcer of other part of left lower leg with fat layer exposed R60.0 Localized edema I87.2 Venous insufficiency (chronic) (peripheral) Z79.52 Long term (current) use of systemic steroids Follow-up Appointments ppointment in 1 week. - Dr. Celine Ahr - Room 3 Return A Bathing/ Shower/ Hygiene May shower with protection but do not get wound dressing(s) wet. Protect dressing(s) with water repellant cover (for example, large plastic bag) or a cast cover and may then take shower. Edema Control - Lymphedema / SCD / Other Elevate legs to the level of the heart or above for 30 minutes daily and/or when sitting for 3-4 times a day throughout the day. Avoid standing for long periods of time. Non Wound Condition Other Non Wound Condition Orders/Instructions: - 3 layer compression wrap to right leg Wound Treatment Wound #2 - Lower Leg Wound Laterality: Left, Lateral, Proximal Cleanser: Soap and Water 1 x Per Week/30 Days Discharge Instructions: May shower and wash wound with dial antibacterial soap and water prior to dressing change. Cleanser: Wound Cleanser 1 x Per Week/30 Days Discharge Instructions: Cleanse the wound with wound cleanser prior to applying a clean dressing using gauze sponges, not tissue or cotton balls. Peri-Wound Care: Sween Lotion (Moisturizing lotion) 1 x Per Week/30 Days Discharge Instructions: Apply moisturizing lotion as directed Prim Dressing: Sorbalgon AG Dressing 2x2 (in/in) 1 x Per Week/30 Days ary Discharge Instructions: Apply to wound bed as  instructed Secondary Dressing: Woven Gauze Sponge, Non-Sterile 4x4 in 1 x Per Week/30 Days Discharge Instructions: Apply over primary dressing as directed. Compression Wrap: ThreePress (3 layer compression wrap) 1 x Per Week/30 Days Discharge Instructions: Apply three layer compression as directed. Electronic Signature(s) Signed: 04/21/2022 1:35:04 PM By: Fredirick Maudlin MD FACS Entered By: Fredirick Maudlin on 04/21/2022 08:25:32 -------------------------------------------------------------------------------- Problem List Details Patient Name: Date of Service: [name], [name] 04/21/2022 7:45 A M Medical  Record Number: [id number] Patient Account Number: [id number] Date of Birth/Sex: Treating RN: 1930/08/27 (89 y.o. F) Primary Care Provider: [name] Other Clinician: Referring Provider: Treating Provider/Extender: Jethro Bolus in Treatment: 4 Active Problems ICD-10 Encounter Code Description Active Date MDM Diagnosis [phone] Non-pressure chronic ulcer of other part of left lower leg with fat layer exposed2/19/2024 No Yes R60.0 Localized edema 03/20/2022 No Yes SHONNA, NANCE ([id number]) [id number] Page 4 of 8 I87.2 Venous insufficiency (chronic) (peripheral) 03/20/2022 No Yes Z79.52 Long term (current) use of systemic steroids 03/20/2022 No Yes Inactive Problems Resolved Problems ICD-10 Code Description Active Date Resolved Date L97.811 Non-pressure chronic ulcer of other part of right lower leg limited to breakdown of skin 03/20/2022 03/20/2022 L97.511 Non-pressure chronic ulcer of other part of right foot limited to breakdown of skin 03/20/2022 03/20/2022 Electronic Signature(s) Signed: 04/21/2022 8:24:05 AM By: Fredirick Maudlin MD FACS Previous Signature: 04/21/2022 8:12:06 AM Version By: Fredirick Maudlin MD FACS Entered By: Fredirick Maudlin on 04/21/2022  08:24:05 -------------------------------------------------------------------------------- Progress Note Details Patient Name: Date of Service: [name], [name] 04/21/2022 7:45 A M Medical Record Number: [id number] Patient Account Number: [id number] Date of Birth/Sex: Treating RN: Jun 02, 1930 (89 y.o. F) Primary Care Provider: [name] Other Clinician: Referring Provider: Treating Provider/Extender: Jethro Bolus in Treatment: 4 Subjective Chief Complaint Information obtained from Patient Patient presents for treatment of open ulcers due to venous insufficiency History of Present Illness (HPI) ADMISSION 03/20/2022 This is an 89 year old woman with coronary artery disease, on chronic corticosteroids for osteoarthritis, who presents with bilateral lower leg ulcers. They have been present for a couple of weeks. I do not have any vascular studies available to discern whether or not she has had venous reflux or arterial studies done. ABIs in clinic were 1.34 on the left and 0.95 on the right. She is not diabetic. She does not smoke. Her legs are quite edematous and she has multiple hematomas of various sizes and ages extending up to at least the distal thigh. She has 3 ulcers on the left leg with slough and eschar accumulation. On the right leg, she has a tiny superficial ulcer on the anterior tibial surface. She also has a small ulcer on the lateral aspect of her right third toe. 03/27/2022: The ulcer on her right anterior tibial surface and lateral third toe are both healed. The ulcers on her left leg are smaller but have slough accumulation. Edema control is good. Her juxta lite stockings arrived in the mail, but she did not bring them with her today. 04/06/2022: She has a new skin tear just distal to her left knee. The ulcer on her left upper posterior calf is nearly healed under a layer of eschar. She has a spot on her right second toe that she is concerned about,  saying that she frequently gets an ulcer there, but I do not see any open wound in this site. 04/21/2022: All of her wounds are healed except for the skin tear distal to her left knee. There is a layer of eschar on the surface. Underneath, the wound is quite superficial and clean. Patient History Information obtained from Patient. Family History Heart Disease - Father, Hypertension - Mother, No family history of Cancer, Diabetes, Hereditary Spherocytosis, Kidney Disease, Lung Disease, Seizures, Stroke, Thyroid Problems, Tuberculosis. Social History Never smoker, Marital Status - Married, Alcohol Use - Rarely, Drug Use - No History, Caffeine Use - Moderate. 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 - cardiac catheterization. - cataract extraction bilat.. - colonoscopy. - bilateral total knee replacement. - upper GI endoscopy. Medical A Surgical History Notes nd Gastrointestinal diverticulosis, GERD Musculoskeletal osteoporosis Objective Constitutional Slightly hypertensive. no acute distress. Vitals Time Taken: 7:51 AM, Height: 68 in, Weight: 180 lbs, BMI: 27.4, Temperature: 97.5 F, Pulse: 87 bpm, Respiratory Rate: 16 breaths/min, Blood Pressure: 148/72 mmHg. Respiratory Normal work of breathing on room air. General Notes: 04/21/2022: All of her wounds are healed except for the skin tear distal to her left knee. There is a layer of eschar on the surface. Underneath, the wound is quite superficial and clean. Integumentary (Hair,  Skin) Wound #1 status is Open. Original cause of wound was Gradually Appeared. The date acquired was: 03/02/2022. The wound has been in treatment 4 weeks. The wound is located on the Left,Distal,Lateral Lower Leg. The wound measures 0cm length x 0cm width x 0cm depth; 0cm^2 area and 0cm^3 volume. There is no tunneling or undermining noted. There is a none present amount of drainage noted. The wound margin is distinct with the outline attached to the wound base. There is no granulation within the wound bed. There is no necrotic tissue within the wound bed. The periwound skin appearance had no abnormalities noted for moisture. The periwound skin appearance exhibited: Scarring, Hemosiderin Staining. The periwound skin appearance did not exhibit: Rubor. Periwound temperature was noted as No Abnormality. Wound #2 status is Open. Original cause of wound was Gradually Appeared. The date acquired was: 03/02/2022. The wound has been in treatment 4 weeks. The wound is located on the Left,Proximal,Lateral Lower Leg. The wound measures 0.1cm length x 0.1cm width x 0.1cm depth; 0.008cm^2 area and 0.001cm^3 volume. There is no tunneling or undermining noted. There is a none present amount of drainage noted. The wound margin is distinct with the outline attached to the wound base. There is small (1-33%) red granulation within the wound bed. There is a large (67-100%) amount of necrotic tissue within the wound bed including Eschar. The periwound skin appearance had no abnormalities noted for texture. The periwound skin appearance had no abnormalities noted for moisture. The periwound skin appearance exhibited: Hemosiderin Staining, Rubor. Periwound temperature was noted as No Abnormality. Wound #5 status is Open. Original cause of wound was Skin T ear/Laceration. The date acquired was: 04/03/2022. The wound has been in treatment 2 weeks. The wound is located on the Left,Lateral Knee. The wound measures 0cm length x 0cm  width x 0cm depth; 0cm^2 area and 0cm^3 volume. There is no tunneling or undermining noted. There is a none present amount of drainage noted. The wound margin is distinct with the outline attached to the wound base. There is no granulation within the wound bed. There is no necrotic tissue within the wound bed. The periwound skin appearance had no abnormalities noted for moisture. The periwound skin appearance had no abnormalities noted for color. The periwound skin appearance exhibited: Scarring. Periwound temperature was noted as No Abnormality. Assessment Active Problems ICD-10 Non-pressure chronic ulcer of other part of left lower leg with fat layer exposed Localized edema Venous insufficiency (chronic) (peripheral) Long term (current) use of systemic steroids Procedures Wound #2 KEDRA, TAMAS ([id number]) [id number] Page 6 of 8 Pre-procedure  diagnosis of Wound #2 is a Lymphedema located on the Left,Proximal,Lateral Lower Leg . There was a Selective/Open Wound Non-Viable Tissue Debridement with a total area of 0.16 sq cm performed by Fredirick Maudlin, MD. With the following instrument(s): Curette to remove Non-Viable tissue/material. Material removed includes Eschar. No specimens were taken. A time out was conducted at 08:09, prior to the start of the procedure. A Minimum amount of bleeding was controlled with Pressure. The procedure was tolerated well. Post Debridement Measurements: 0.1cm length x 0.1cm width x 0.1cm depth; 0.001cm^3 volume. Character of Wound/Ulcer Post Debridement is improved. Post procedure Diagnosis Wound #2: Same as Pre-Procedure General Notes: scribed for Dr. Celine Ahr by [name], RN. Pre-procedure diagnosis of Wound #2 is a Lymphedema located on the Left,Proximal,Lateral Lower Leg . There was a Three Layer Compression Therapy Procedure by [name], RN. Post procedure Diagnosis Wound #2: Same as  Pre-Procedure Plan Follow-up Appointments: Return Appointment in 1 week. - Dr. Celine Ahr - Room 3 Bathing/ Shower/ Hygiene: May shower with protection but do not get wound dressing(s) wet. Protect dressing(s) with water repellant cover (for example, large plastic bag) or a cast cover and may then take shower. Edema Control - Lymphedema / SCD / Other: Elevate legs to the level of the heart or above for 30 minutes daily and/or when sitting for 3-4 times a day throughout the day. Avoid standing for long periods of time. Non Wound Condition: Other Non Wound Condition Orders/Instructions: - 3 layer compression wrap to right leg WOUND #2: - Lower Leg Wound Laterality: Left, Lateral, Proximal Cleanser: Soap and Water 1 x Per Week/30 Days Discharge Instructions: May shower and wash wound with dial antibacterial soap and water prior to dressing change. Cleanser: Wound Cleanser 1 x Per Week/30 Days Discharge Instructions: Cleanse the wound with wound cleanser prior to applying a clean dressing using gauze sponges, not tissue or cotton balls. Peri-Wound Care: Sween Lotion (Moisturizing lotion) 1 x Per Week/30 Days Discharge Instructions: Apply moisturizing lotion as directed Prim Dressing: Sorbalgon AG Dressing 2x2 (in/in) 1 x Per Week/30 Days ary Discharge Instructions: Apply to wound bed as instructed Secondary Dressing: Woven Gauze Sponge, Non-Sterile 4x4 in 1 x Per Week/30 Days Discharge Instructions: Apply over primary dressing as directed. Com pression Wrap: ThreePress (3 layer compression wrap) 1 x Per Week/30 Days Discharge Instructions: Apply three layer compression as directed. 04/21/2022: All of her wounds are healed except for the skin tear distal to her left knee. There is a layer of eschar on the surface. Underneath, the wound is quite superficial and clean. I used a curette to debride the eschar from the wound. We will continue silver alginate and 3 layer compression. We will continue to  place padding between her right first and second toe to avoid friction and pressure. She has her juxta lite stockings with her today and we will help her apply this to her right leg. She will follow-up in 1 week. Electronic Signature(s) Signed: 04/21/2022 8:26:10 AM By: Fredirick Maudlin MD FACS Entered By: Fredirick Maudlin on 04/21/2022 08:26:09 -------------------------------------------------------------------------------- HxROS Details Patient Name: Date of Service: [name], [name] 04/21/2022 7:45 A M Medical Record Number: [id number] Patient Account Number: [id number] Date of Birth/Sex: Treating RN: July 15, 1930 (89 y.o. F) Primary Care Provider: [name] Other Clinician: Referring Provider: Treating Provider/Extender: Jethro Bolus in Treatment: 4 Information Obtained From Patient Eyes Medical History: Positive for: Cataracts Hematologic/Lymphatic Medical History: MELEAH, STUMPH ([id number]) [id number] Page 7 of 8 Positive for: Lymphedema Respiratory  Medical History: Positive for: Sleep Apnea Cardiovascular Medical History: Positive for: Coronary Artery Disease; Hypertension Gastrointestinal Medical History: Positive for: Hepatitis B - 50+ yrs ago Past Medical History Notes: diverticulosis, GERD Musculoskeletal Medical History: Positive for: Osteoarthritis Past Medical History Notes: osteoporosis HBO Extended History Items Eyes: Cataracts Immunizations Pneumococcal Vaccine: Received Pneumococcal Vaccination: Yes Received Pneumococcal Vaccination On or After 60th Birthday: Yes Implantable Devices No devices added Hospitalization / Surgery History Type of Hospitalization/Surgery abdominal hysterectomy cardiac catheterization cataract extraction bilat. colonoscopy bilateral total knee replacement upper GI endoscopy Family and Social History Cancer: No; Diabetes: No; Heart Disease: Yes - Father;  Hereditary Spherocytosis: No; Hypertension: Yes - Mother; Kidney Disease: No; Lung Disease: No; Seizures: No; Stroke: No; Thyroid Problems: No; Tuberculosis: No; Never smoker; Marital Status - Married; Alcohol Use: Rarely; Drug Use: No History; Caffeine Use: Moderate; Financial Concerns: No; Food, Clothing or Shelter Needs: No; Support System Lacking: No; Transportation Concerns: No Electronic Signature(s) Signed: 04/21/2022 1:35:04 PM By: Fredirick Maudlin MD FACS Entered By: Fredirick Maudlin on 04/21/2022 08:25:01 -------------------------------------------------------------------------------- SuperBill Details Patient Name: Date of Service: [name], [name] 04/21/2022 Medical Record Number: [id number] Patient Account Number: [id number] Date of Birth/Sex: Treating RN: Aug 27, 1930 (89 y.o. F) Primary Care Provider: [name] Other Clinician: Referring Provider: Treating Provider/Extender: Jethro Bolus in Treatment: 4 Diagnosis Coding ICD-10 Codes Code Description [phone] Non-pressure chronic ulcer of other part of left lower leg with fat layer exposed Roselle, Rosenhayn ([id number]) [id number] Page 8 of 8 R60.0 Localized edema I87.2 Venous insufficiency (chronic) (peripheral) Z79.52 Long term (current) use of systemic steroids Facility Procedures : CPT4 Code: [id number] Description: [id number] - DEBRIDE WOUND 1ST 20 SQ CM OR < ICD-10 Diagnosis Description L97.822 Non-pressure chronic ulcer of other part of left lower leg with fat layer expose Modifier: d Quantity: 1 Physician Procedures : CPT4 Code Description Modifier [id number] - WC PHYS LEVEL 3 - EST PT 25 ICD-10 Diagnosis Description L97.822 Non-pressure chronic ulcer of other part of left lower leg with fat layer exposed R60.0 Localized edema I87.2 Venous insufficiency (chronic)  (peripheral) Z79.52 Long term (current) use of systemic steroids Quantity: 1 : [id number] - WC  PHYS DEBR WO ANESTH 20 SQ CM ICD-10 Diagnosis Description L97.822 Non-pressure chronic ulcer of other part of left lower leg with fat layer exposed Quantity: 1 Electronic Signature(s) Signed: 04/21/2022 8:26:26 AM By: Fredirick Maudlin MD FACS Entered By: Fredirick Maudlin on 04/21/2022 [id number]

## 2022-04-22 NOTE — Progress Notes (Signed)
[name] ([id number]) [id number] Page 1 of 9 Visit Report for 04/21/2022 Arrival Information Details Patient Name: Date of Service: [name], [name] 04/21/2022 7:45 A M Medical Record Number: [id number] Patient Account Number: [id number] Date of Birth/Sex: Treating RN: 03/12/1930 (89 y.o. [name] Primary Care [name]: [name] Other Clinician: Referring [name]: Treating [name]/Extender: [name] in Treatment: 4 Visit Information History Since Last Visit Added or deleted any medications: No Patient Arrived: [name] Any new allergies or adverse reactions: No Arrival Time: 07:46 Had a fall or experienced change in No Accompanied By: husband activities of daily living that may affect Transfer Assistance: Manual risk of falls: Patient Identification Verified: Yes Signs or symptoms of abuse/neglect since last visito No Secondary Verification Process Completed: Yes Hospitalized since last visit: No Patient Requires Transmission-Based Precautions: No Implantable device outside of the clinic excluding No Patient Has Alerts: No cellular tissue based products placed in the center since last visit: Has Dressing in Place as Prescribed: Yes Has Compression in Place as Prescribed: Yes Pain Present Now: No Electronic Signature(s) Signed: 04/21/2022 3:34:35 PM By: [name] Entered By: [name] on 04/21/2022 07:47:10 -------------------------------------------------------------------------------- Compression Therapy Details Patient Name: Date of Service: [name], [name] 04/21/2022 7:45 A M Medical Record Number: [id number] Patient Account Number: [id number] Date of Birth/Sex: Treating RN: 1930/05/16 (89 y.o. [name] Primary Care Malanie Koloski: [name] Other Clinician: Referring Juda Lajeunesse: Treating Jaidan Stachnik/Extender: [name] in Treatment:  4 Compression Therapy Performed for Wound Assessment: Wound #2 Left,Proximal,Lateral Lower Leg Performed By: Clinician Linda Peals, RN Compression Type: Three Layer Post Procedure Diagnosis Same as Pre-procedure Electronic Signature(s) Signed: 04/21/2022 3:34:35 PM By: [name] Entered By: [name] on 04/21/2022 08:10:34 -------------------------------------------------------------------------------- Encounter Discharge Information Details Patient Name: Date of Service: [name], [name] 04/21/2022 7:45 A M Medical Record Number: [id number] Patient Account Number: [id number] Date of Birth/Sex: Treating RN: [birth year] (89 y.o. [name] Primary Care Kamya Watling: [name] Other Clinician: Referring Eyanna Mcgonagle: Treating Keenya Matera/Extender: [name] in Treatment: 4 Encounter Discharge Information Items Post Procedure Vitals Discharge Condition: Stable Temperature (F): 97.5 Ambulatory Status: Walker Pulse (bpm): 87 Discharge Destination: Home Respiratory Rate (breaths/min): 16 Transportation: Private Auto Blood Pressure (mmHg): 148/72 [place], [place] ([id number]) [id number] Page 2 of 9 Accompanied By: husband Schedule Follow-up Appointment: Yes Clinical Summary of Care: Patient Declined Electronic Signature(s) Signed: 04/21/2022 3:34:35 PM By: [name] By: [name] on 04/21/2022 08:42:56 -------------------------------------------------------------------------------- Lower Extremity Assessment Details Patient Name: Date of Service: [name], [name] 04/21/2022 7:45 A M Medical Record Number: [id number] Patient Account Number: [id number] Date of Birth/Sex: Treating RN: 08-Aug-1930 (89 y.o. [name] Primary Care [name]: [name] Other Clinician: Referring Sapir Lavey: Treating [name]/Extender: [name] in  Treatment: 4 Edema Assessment Assessed: [name]: No] [name]: No] [Left: Edema] [Right: :] Calf Left: Right: Point of Measurement: From Medial Instep 32 cm 32.5 cm Ankle Left: Right: Point of Measurement: From Medial Instep 19 cm 19 cm Vascular Assessment Pulses: Dorsalis Pedis Palpable: [Left:Yes] [Right:Yes] Electronic Signature(s) Signed: 04/21/2022 3:34:35 PM By: [name] Entered By: [name] on 04/21/2022 08:01:58 -------------------------------------------------------------------------------- Multi Wound Chart Details Patient Name: Date of Service: [name], [name] 04/21/2022 7:45 A M Medical Record Number: [id number] Patient Account Number: [id number] Date of Birth/Sex: Treating RN: November 23, 1930 (89 y.o. F) Primary Care [name]: [name] Other Clinician: Referring Saif Peter: Treating Jadda Hunsucker/Extender: [name] in  Treatment: 4 Vital Signs Height(in): 68 Pulse(bpm): 87 Weight(lbs): 180 Blood Pressure(mmHg): 148/72 Body Mass Index(BMI): 27.4 Temperature(F): 97.5 Respiratory Rate(breaths/min): 16 [1:Photos:] [id number].pdf Page 3 of 9] Left, Distal, Lateral Lower Leg Left, Proximal, Lateral Lower Leg Left, Lateral Knee Wound Location: Gradually Appeared Gradually Appeared Skin T ear/Laceration Wounding Event: Lymphedema Lymphedema Trauma, Other Primary Etiology: Cataracts, Lymphedema, Sleep Cataracts, Lymphedema, Sleep Cataracts, Lymphedema, Sleep Comorbid History: Apnea, Coronary Artery Disease, Apnea, Coronary Artery Disease, Apnea, Coronary Artery Disease, Hypertension, Hepatitis B, Hypertension, Hepatitis B, Hypertension, Hepatitis B, Osteoarthritis Osteoarthritis Osteoarthritis 03/02/2022 03/02/2022 04/03/2022 Date Acquired: 4 4 2  Weeks of Treatment: Open Open Open Wound Status: No No No Wound Recurrence: 0x0x0 0.1x0.1x0.1 0x0x0 Measurements L x W x D (cm) 0 0.008 0 A (cm)  : rea 0 0.001 0 Volume (cm) : 100.00% 99.70% 100.00% % Reduction in A rea: 100.00% 99.70% 100.00% % Reduction in Volume: Full Thickness Without Exposed Full Thickness Without Exposed Full Thickness Without Exposed Classification: Support Structures Support Structures Support Structures None Present None Present None Present Exudate A mount: Distinct, outline attached Distinct, outline attached Distinct, outline attached Wound Margin: None Present (0%) Small (1-33%) None Present (0%) Granulation A mount: N/A Red N/A Granulation Quality: None Present (0%) Large (67-100%) None Present (0%) Necrotic A mount: N/A Eschar N/A Necrotic Tissue: Fascia: No Fascia: No Fascia: No Exposed Structures: Fat Layer (Subcutaneous Tissue): No Fat Layer (Subcutaneous Tissue): No Fat Layer (Subcutaneous Tissue): No Tendon: No Tendon: No Tendon: No Muscle: No Muscle: No Muscle: No Joint: No Joint: No Joint: No Bone: No Bone: No Bone: No Large (67-100%) Large (67-100%) Large (67-100%) Epithelialization: N/A Debridement - Selective/Open Wound N/A Debridement: Pre-procedure Verification/Time Out N/A 08:09 N/A Taken: N/A Necrotic/Eschar N/A Tissue Debrided: N/A Non-Viable Tissue N/A Level: N/A 0.16 N/A Debridement A (sq cm): rea N/A Curette N/A Instrument: N/A Minimum N/A Bleeding: N/A Pressure N/A Hemostasis A chieved: N/A Procedure was tolerated well N/A Debridement Treatment Response: N/A 0.1x0.1x0.1 N/A Post Debridement Measurements L x W x D (cm) N/A 0.001 N/A Post Debridement Volume: (cm) Scarring: Yes No Abnormalities Noted Scarring: Yes Periwound Skin Texture: No Abnormalities Noted No Abnormalities Noted No Abnormalities Noted Periwound Skin Moisture: Hemosiderin Staining: Yes Hemosiderin Staining: Yes No Abnormalities Noted Periwound Skin Color: Rubor: No Rubor: Yes No Abnormality No Abnormality No Abnormality Temperature: N/A Compression Therapy  N/A Procedures Performed: Debridement Treatment Notes Wound #1 (Lower Leg) Wound Laterality: Left, Lateral, Distal Cleanser Peri-Wound Care Topical Primary Dressing Secondary Dressing Secured With Compression Wrap Compression Stockings Add-Ons Wound #2 (Lower Leg) Wound Laterality: Left, Lateral, Proximal Cleanser Soap and Water Discharge Instruction: May shower and wash wound with dial antibacterial soap and water prior to dressing change. Wound Cleanser Discharge Instruction: Cleanse the wound with wound cleanser prior to applying a clean dressing using gauze sponges, not tissue or cotton balls. JACCI, [place] ([id number]) [id number] Page 4 of 9 Peri-Wound Care Sween Lotion (Moisturizing lotion) Discharge Instruction: Apply moisturizing lotion as directed Topical Primary Dressing Sorbalgon AG Dressing 2x2 (in/in) Discharge Instruction: Apply to wound bed as instructed Secondary Dressing Woven Gauze Sponge, Non-Sterile 4x4 in Discharge Instruction: Apply over primary dressing as directed. Secured With Compression Wrap ThreePress (3 layer compression wrap) Discharge Instruction: Apply three layer compression as directed. Compression Stockings Add-Ons Wound #5 (Knee) Wound Laterality: Left, Lateral Cleanser Peri-Wound Care Topical Primary Dressing Secondary Dressing Secured With Compression Wrap Compression Stockings Add-Ons Electronic Signature(s) Signed: 04/21/2022 8:24:13 AM By: Fredirick Maudlin MD FACS Previous Signature: 04/21/2022 8:12:16 AM Version By: Fredirick Maudlin  MD FACS Entered By: Fredirick Maudlin on 04/21/2022 08:24:12 -------------------------------------------------------------------------------- Multi-Disciplinary Care Plan Details Patient Name: Date of Service: [name], [name] 04/21/2022 7:45 A M Medical Record Number: [id number] Patient Account Number: [id number] Date of Birth/Sex: Treating RN: 1931-01-08 (89 y.o. [name] Primary Care Ivori Storr: [name] Other Clinician: Referring Zamiah Tollett: Treating Melrose Kearse/Extender: [name] in Treatment: 4 Active Inactive Abuse / Safety / Falls / Self Care Management Nursing Diagnoses: History of Falls Impaired physical mobility Potential for falls Goals: Patient will not experience any injury related to falls Date Initiated: 03/20/2022 Target Resolution Date: 05/05/2022 Goal Status: Active Patient/caregiver will demonstrate safe use of adaptive devices to increase mobility [place], [place] ([id number]) [id number] Page 5 of 9 Date Initiated: 03/20/2022 Target Resolution Date: 05/05/2022 Goal Status: Active Interventions: Assess: immobility, friction, shearing, incontinence upon admission and as needed Provide education on fall prevention Notes: Wound/Skin Impairment Nursing Diagnoses: Impaired tissue integrity Knowledge deficit related to ulceration/compromised skin integrity Goals: Patient/caregiver will verbalize understanding of skin care regimen Date Initiated: 03/20/2022 Target Resolution Date: 05/05/2022 Goal Status: Active Interventions: Assess ulceration(s) every visit Treatment Activities: Skin care regimen initiated : 03/20/2022 Topical wound management initiated : 03/20/2022 Notes: Electronic Signature(s) Signed: 04/21/2022 3:34:35 PM By: [name] Entered By: [name] on 04/21/2022 08:10:41 -------------------------------------------------------------------------------- Pain Assessment Details Patient Name: Date of Service: [name], [name] 04/21/2022 7:45 A M Medical Record Number: [id number] Patient Account Number: [id number] Date of Birth/Sex: Treating RN: 1930/04/09 (89 y.o. [name] Primary Care Marciano Mundt: [name] Other Clinician: Referring Midge Momon: Treating Fabricio Endsley/Extender: [name] in  Treatment: 4 Active Problems Location of Pain Severity and Description of Pain Patient Has Paino No Site Locations Rate the pain. Current Pain Level: 0 Pain Management and Medication Current Pain Management: Electronic Signature(s) Signed: 04/21/2022 3:34:35 PM By: Claris Che, [name] ([id number]) [id number] Page 6 of 9 Entered By: [name] on 04/21/2022 07:50:59 -------------------------------------------------------------------------------- Patient/Caregiver Education Details Patient Name: Date of Service: [name], [place] 3/22/2024andnbsp7:45 A M Medical Record Number: [id number] Patient Account Number: [id number] Date of Birth/Gender: Treating RN: July 20, 1930 (89 y.o. [name] Primary Care Physician: [name] Other Clinician: Referring Physician: Treating Physician/Extender: [name] in Treatment: 4 Education Assessment Education Provided To: Patient Education Topics Provided Safety: Methods: Explain/Verbal Responses: Reinforcements needed, State content correctly Electronic Signature(s) Signed: 04/21/2022 3:34:35 PM By: [name] Entered By: [name] on 04/21/2022 08:07:57 -------------------------------------------------------------------------------- Wound Assessment Details Patient Name: Date of Service: [name], [name] 04/21/2022 7:45 A M Medical Record Number: [id number] Patient Account Number: [id number] Date of Birth/Sex: Treating RN: [birth year] (89 y.o. [name] Primary Care Ramiah Helfrich: [name] Other Clinician: Referring [place]: Treating Taressa Rauh/Extender: [name] in Treatment: 4 Wound Status Wound Number: 1 Primary Lymphedema Etiology: Wound Location: Left, Distal, Lateral Lower Leg Wound Open Wounding Event: Gradually Appeared Status: Date Acquired: 03/02/2022 Comorbid Cataracts,  Lymphedema, Sleep Apnea, Coronary Artery Disease, Weeks Of Treatment: 4 History: Hypertension, Hepatitis B, Osteoarthritis Clustered Wound: No Photos Wound Measurements Length: (cm) Width: (cm) Depth: (cm) Area: (cm) Volume: (cm) 0 % Reduction in Area: 100% 0 % Reduction in Volume: 100% 0 Epithelialization: Large (67-100%) 0 Tunneling: No 0 Undermining: No Wound Description Classification: Full Thickness Without Exposed Support Structures Paul ([id number]) Wound Margin: Distinct, outline attached Exudate Amount: None Present Foul Odor After Cleansing: No [id number] Page 7 of 9 Slough/Fibrino No Wound Bed Granulation Amount: None Present (  0%) Exposed Structure Necrotic Amount: None Present (0%) Fascia Exposed: No Fat Layer (Subcutaneous Tissue) Exposed: No Tendon Exposed: No Muscle Exposed: No Joint Exposed: No Bone Exposed: No Periwound Skin Texture Texture Color No Abnormalities Noted: No No Abnormalities Noted: No Scarring: Yes Hemosiderin Staining: Yes Rubor: No Moisture No Abnormalities Noted: Yes Temperature / Pain Temperature: No Abnormality Electronic Signature(s) Signed: 04/21/2022 3:34:35 PM By: [name] Entered By: [name] on 04/21/2022 08:04:40 -------------------------------------------------------------------------------- Wound Assessment Details Patient Name: Date of Service: [name], [name] 04/21/2022 7:45 A M Medical Record Number: [id number] Patient Account Number: [id number] Date of Birth/Sex: Treating RN: 21-May-1930 (89 y.o. [name] Primary Care Emilene Roma: [name] Other Clinician: Referring Genecis Veley: Treating Ajahnae Rathgeber/Extender: [name] in Treatment: 4 Wound Status Wound Number: 2 Primary Lymphedema Etiology: Wound Location: Left, Proximal, Lateral Lower Leg Wound Open Wounding Event: Gradually Appeared Status: Date Acquired:  03/02/2022 Comorbid Cataracts, Lymphedema, Sleep Apnea, Coronary Artery Disease, Weeks Of Treatment: 4 History: Hypertension, Hepatitis B, Osteoarthritis Clustered Wound: No Photos Wound Measurements Length: (cm) 0.1 Width: (cm) 0.1 Depth: (cm) 0.1 Area: (cm) 0.008 Volume: (cm) 0.001 % Reduction in Area: 99.7% % Reduction in Volume: 99.7% Epithelialization: Large (67-100%) Tunneling: No Undermining: No Wound Description Classification: Full Thickness Without Exposed Suppor Wound Margin: Distinct, outline attached Exudate Amount: None Present t Structures Foul Odor After Cleansing: No Slough/Fibrino No Wound Bed Granulation Amount: Small (1-33%) Exposed [place], [place] ([id number]) [id number] Page 8 of 9 Granulation Quality: Red Fascia Exposed: No Necrotic Amount: Large (67-100%) Fat Layer (Subcutaneous Tissue) Exposed: No Necrotic Quality: Eschar Tendon Exposed: No Muscle Exposed: No Joint Exposed: No Bone Exposed: No Periwound Skin Texture Texture Color No Abnormalities Noted: Yes No Abnormalities Noted: No Hemosiderin Staining: Yes Moisture Rubor: Yes No Abnormalities Noted: Yes Temperature / Pain Temperature: No Abnormality Treatment Notes Wound #2 (Lower Leg) Wound Laterality: Left, Lateral, Proximal Cleanser Soap and Water Discharge Instruction: May shower and wash wound with dial antibacterial soap and water prior to dressing change. Wound Cleanser Discharge Instruction: Cleanse the wound with wound cleanser prior to applying a clean dressing using gauze sponges, not tissue or cotton balls. Peri-Wound Care Sween Lotion (Moisturizing lotion) Discharge Instruction: Apply moisturizing lotion as directed Topical Primary Dressing Sorbalgon AG Dressing 2x2 (in/in) Discharge Instruction: Apply to wound bed as instructed Secondary Dressing Woven Gauze Sponge, Non-Sterile 4x4 in Discharge Instruction: Apply over primary  dressing as directed. Secured With Compression Wrap ThreePress (3 layer compression wrap) Discharge Instruction: Apply three layer compression as directed. Compression Stockings Add-Ons Electronic Signature(s) Signed: 04/21/2022 3:34:35 PM By: [name] Entered By: [name] on 04/21/2022 08:04:59 -------------------------------------------------------------------------------- Wound Assessment Details Patient Name: Date of Service: [name], [name] 04/21/2022 7:45 A M Medical Record Number: [id number] Patient Account Number: [id number] Date of Birth/Sex: Treating RN: 1930/10/17 (89 y.o. [name] Primary Care Kataleia Quaranta: [name] Other Clinician: Referring Joesiah Lonon: Treating Darionna Banke/Extender: [name] in Treatment: 4 Wound Status Wound Number: 5 Primary Trauma, Other Etiology: Wound Location: Left, Lateral Knee Wound Open Wounding Event: Skin Tear/Laceration Status: Date Acquired: 04/03/2022 Comorbid Cataracts, Lymphedema, Sleep Apnea, Coronary Artery Disease, Weeks Of Treatment: 2 History: Hypertension, Hepatitis B, Osteoarthritis Clustered Wound: No Photos [place], [name] ([id number]) [id number] Page 9 of 9 Wound Measurements Length: (cm) Width: (cm) Depth: (cm) Area: (cm) Volume: (cm) 0 % Reduction in Area: 100% 0 % Reduction in Volume: 100% 0 Epithelialization: Large (67-100%) 0 Tunneling: No 0 Undermining: No Wound Description Classification: Full  Thickness Without Exposed Support Structures Wound Margin: Distinct, outline attached Exudate Amount: None Present Foul Odor After Cleansing: No Slough/Fibrino No Wound Bed Granulation Amount: None Present (0%) Exposed Structure Necrotic Amount: None Present (0%) Fascia Exposed: No Fat Layer (Subcutaneous Tissue) Exposed: No Tendon Exposed: No Muscle Exposed: No Joint Exposed: No Bone Exposed: No Periwound Skin  Texture Texture Color No Abnormalities Noted: No No Abnormalities Noted: Yes Scarring: Yes Temperature / Pain Temperature: No Abnormality Moisture No Abnormalities Noted: Yes Electronic Signature(s) Signed: 04/21/2022 3:34:35 PM By: [name] Entered By: [name] on 04/21/2022 08:05:17 -------------------------------------------------------------------------------- [place] Details Patient Name: Date of Service: [name], [name] 04/21/2022 7:45 A M Medical Record Number: [id number] Patient Account Number: [id number] Date of Birth/Sex: Treating RN: 01-29-1931 (89 y.o. [name] Primary Care Kamika Goodloe: [name] Other Clinician: Referring Kaiyan Luczak: Treating Reeve Mallo/Extender: [name] in Treatment: 4 Vital Signs Time Taken: 07:51 Temperature (F): 97.5 Height (in): 68 Pulse (bpm): 87 Weight (lbs): 180 Respiratory Rate (breaths/min): 16 Body Mass Index (BMI): 27.4 Blood Pressure (mmHg): 148/72 Reference Range: 80 - 120 mg / dl Electronic Signature(s) Signed: 04/21/2022 3:34:35 PM By: [name] Entered By: [name] on 04/21/2022 07:51:51

## 2022-04-27 ENCOUNTER — Encounter (HOSPITAL_BASED_OUTPATIENT_CLINIC_OR_DEPARTMENT_OTHER): Payer: Medicare Other | Admitting: General Surgery

## 2022-04-27 DIAGNOSIS — L97811 Non-pressure chronic ulcer of other part of right lower leg limited to breakdown of skin: Secondary | ICD-10-CM | POA: Diagnosis not present

## 2022-04-27 NOTE — Progress Notes (Signed)
AMYNA, KOWALL ([id number]) [id number] Page 1 of 6 Visit Report for 04/27/2022 Chief Complaint Document Details Patient Name: Date of Service: [name], [name] 04/27/2022 10:45 A M Medical Record Number: [id number] Patient Account Number: [id number] Date of Birth/Sex: Treating RN: 02/01/1930 (89 y.o. F) Primary Care Provider: [name] Other Clinician: Referring Provider: Treating Provider/Extender: Jethro Bolus in Treatment: 5 Information Obtained from: Patient Chief Complaint Patient presents for treatment of open ulcers due to venous insufficiency Electronic Signature(s) Signed: 04/27/2022 11:38:35 AM By: Fredirick Maudlin MD FACS Entered By: Fredirick Maudlin on 04/27/2022 11:38:35 -------------------------------------------------------------------------------- HPI Details Patient Name: Date of Service: [name], [name] 04/27/2022 10:45 A M Medical Record Number: [id number] Patient Account Number: [id number] Date of Birth/Sex: Treating RN: 04-02-1930 (89 y.o. F) Primary Care Provider: [name] Other Clinician: Referring Provider: Treating Provider/Extender: Jethro Bolus in Treatment: 5 History of Present Illness HPI Description: ADMISSION 03/20/2022 This is an 89 year old woman with coronary artery disease, on chronic corticosteroids for osteoarthritis, who presents with bilateral lower leg ulcers. They have been present for a couple of weeks. I do not have any vascular studies available to discern whether or not she has had venous reflux or arterial studies done. ABIs in clinic were 1.34 on the left and 0.95 on the right. She is not diabetic. She does not smoke. Her legs are quite edematous and she has multiple hematomas of various sizes and ages extending up to at least the distal thigh. She has 3 ulcers on the left leg with slough and eschar accumulation. On the right leg, she has a tiny  superficial ulcer on the anterior tibial surface. She also has a small ulcer on the lateral aspect of her right third toe. 03/27/2022: The ulcer on her right anterior tibial surface and lateral third toe are both healed. The ulcers on her left leg are smaller but have slough accumulation. Edema control is good. Her juxta lite stockings arrived in the mail, but she did not bring them with her today. 04/06/2022: She has a new skin tear just distal to her left knee. The ulcer on her left upper posterior calf is nearly healed under a layer of eschar. She has a spot on her right second toe that she is concerned about, saying that she frequently gets an ulcer there, but I do not see any open wound in this site. 04/21/2022: All of her wounds are healed except for the skin tear distal to her left knee. There is a layer of eschar on the surface. Underneath, the wound is quite superficial and clean. 04/19/2022: All of her wounds are healed. She is wearing a juxta lite stocking on her right leg and has brought her other stocking for [place] to apply to her left. Electronic Signature(s) Signed: 04/27/2022 11:39:00 AM By: Fredirick Maudlin MD FACS Entered By: Fredirick Maudlin on 04/27/2022 11:39:00 -------------------------------------------------------------------------------- Physical Exam Details Patient Name: Date of Service: [name], [name] 04/27/2022 10:45 A M Medical Record Number: [id number] Patient Account Number: [id number] Date of Birth/Sex: Treating RN: [birth year] (89 y.o. F) Primary Care Provider: [name] Other Clinician: Referring Provider: Treating Provider/Extender: Jethro Bolus in Treatment: [name], [name] [id number] Page 2 of 6 Constitutional Slightly hypertensive. . . . no acute distress. Respiratory Normal work of breathing on room air. Notes 04/27/2022: All of her wounds are healed. Electronic Signature(s) Signed:  04/27/2022 11:40:01 AM By: Fredirick Maudlin MD FACS Entered By: Fredirick Maudlin on  04/27/2022 11:40:01 -------------------------------------------------------------------------------- Physician Orders Details Patient Name: Date of Service: [name], Linda RO Carlean Jews 04/27/2022 10:45 A M Medical Record Number: [id number] Patient Account Number: [id number] Date of Birth/Sex: Treating RN: 21-Dec-1930 (89 y.o. [name] Primary Care Provider: [name] Other Clinician: Referring Provider: Treating Provider/Extender: Jethro Bolus in Treatment: 5 Verbal / Phone Orders: No Diagnosis Coding ICD-10 Coding Code Description [phone] Non-pressure chronic ulcer of other part of left lower leg with fat layer exposed R60.0 Localized edema I87.2 Venous insufficiency (chronic) (peripheral) Z79.52 Long term (current) use of systemic steroids Discharge From [facility] Services Discharge from [place]!!!!!! Edema Control - Lymphedema / SCD / Other Elevate legs to the level of the heart or above for 30 minutes daily and/or when sitting for 3-4 times a day throughout the day. Avoid standing for long periods of time. Patient to wear own compression stockings every day. Moisturize legs daily. Electronic Signature(s) Signed: 04/27/2022 11:40:11 AM By: Fredirick Maudlin MD FACS Entered By: Fredirick Maudlin on 04/27/2022 11:40:10 -------------------------------------------------------------------------------- Problem List Details Patient Name: Date of Service: [name], [name] 04/27/2022 10:45 A M Medical Record Number: [id number] Patient Account Number: [id number] Date of Birth/Sex: Treating RN: January 21, 1931 (89 y.o. F) Primary Care Provider: [name] Other Clinician: Referring Provider: Treating Provider/Extender: Jethro Bolus in Treatment: 5 Active Problems ICD-10 Encounter Code Description Active Date  MDM Diagnosis [phone] Non-pressure chronic ulcer of other part of left lower leg with fat layer exposed2/19/2024 No Yes R60.0 Localized edema 03/20/2022 No Yes [name], [name] ([id number]) [id number] Page 3 of 6 I87.2 Venous insufficiency (chronic) (peripheral) 03/20/2022 No Yes Z79.52 Long term (current) use of systemic steroids 03/20/2022 No Yes Inactive Problems Resolved Problems ICD-10 Code Description Active Date Resolved Date L97.811 Non-pressure chronic ulcer of other part of right lower leg limited to breakdown of skin 03/20/2022 03/20/2022 L97.511 Non-pressure chronic ulcer of other part of right foot limited to breakdown of skin 03/20/2022 03/20/2022 Electronic Signature(s) Signed: 04/27/2022 11:37:29 AM By: Fredirick Maudlin MD FACS Entered By: Fredirick Maudlin on 04/27/2022 11:37:29 -------------------------------------------------------------------------------- Progress Note Details Patient Name: Date of Service: [name], [name] 04/27/2022 10:45 A M Medical Record Number: [id number] Patient Account Number: [id number] Date of Birth/Sex: Treating RN: 11-28-1930 (89 y.o. F) Primary Care Provider: [name] Other Clinician: Referring Provider: Treating Provider/Extender: Jethro Bolus in Treatment: 5 Subjective Chief Complaint Information obtained from Patient Patient presents for treatment of open ulcers due to venous insufficiency History of Present Illness (HPI) ADMISSION 03/20/2022 This is an 89 year old woman with coronary artery disease, on chronic corticosteroids for osteoarthritis, who presents with bilateral lower leg ulcers. They have been present for a couple of weeks. I do not have any vascular studies available to discern whether or not she has had venous reflux or arterial studies done. ABIs in clinic were 1.34 on the left and 0.95 on the right. She is not diabetic. She does not smoke. Her legs are quite  edematous and she has multiple hematomas of various sizes and ages extending up to at least the distal thigh. She has 3 ulcers on the left leg with slough and eschar accumulation. On the right leg, she has a tiny superficial ulcer on the anterior tibial surface. She also has a small ulcer on the lateral aspect of her right third toe. 03/27/2022: The ulcer on her right anterior tibial surface and lateral third toe are both healed. The ulcers on her left leg  are smaller but have slough accumulation. Edema control is good. Her juxta lite stockings arrived in the mail, but she did not bring them with her today. 04/06/2022: She has a new skin tear just distal to her left knee. The ulcer on her left upper posterior calf is nearly healed under a layer of eschar. She has a spot on her right second toe that she is concerned about, saying that she frequently gets an ulcer there, but I do not see any open wound in this site. 04/21/2022: All of her wounds are healed except for the skin tear distal to her left knee. There is a layer of eschar on the surface. Underneath, the wound is quite superficial and clean. 04/19/2022: All of her wounds are healed. She is wearing a juxta lite stocking on her right leg and has brought her other stocking for [place] to apply to her left. Patient History Information obtained from Patient. Family History Heart Disease - Father, Hypertension - Mother, No family history of Cancer, Diabetes, Hereditary Spherocytosis, Kidney Disease, Lung Disease, Seizures, Stroke, Thyroid Problems, Tuberculosis. Social History Never smoker, Marital Status - Married, Alcohol Use - Rarely, Drug Use - No History, Caffeine Use - Moderate. 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 - cardiac catheterization. - cataract extraction bilat.. - colonoscopy. - bilateral total knee replacement. - upper GI endoscopy. Medical A Surgical History Notes nd Gastrointestinal diverticulosis, GERD Musculoskeletal osteoporosis Objective Constitutional Slightly hypertensive. no acute distress. Vitals Time Taken: 11:02 AM, Height: 68 in, Weight: 180 lbs, BMI: 27.4, Temperature: 97.6 F, Pulse: 69 bpm, Respiratory Rate: 16 breaths/min, Blood Pressure: 142/74 mmHg. Respiratory Normal work of breathing on room air. General Notes: 04/27/2022: All of her wounds are healed. Integumentary (Hair, Skin) Wound #2 status is Open. Original cause of wound was Gradually Appeared. The date acquired was: 03/02/2022. The wound has been in treatment 5 weeks. The wound is located on the Left,Proximal,Lateral Lower Leg. The wound measures 0cm length x 0cm width x 0cm depth; 0cm^2 area and 0cm^3 volume. There is no tunneling or undermining noted. There is a none present amount of drainage noted. The wound margin is distinct with the outline attached to the wound base. There is no granulation within the wound bed. There is no necrotic tissue within the wound bed. The periwound skin appearance had no abnormalities noted for texture. The periwound skin appearance had no abnormalities noted for moisture. The periwound skin appearance exhibited: Hemosiderin Staining, Rubor. Periwound temperature was noted as No Abnormality. Assessment Active Problems ICD-10 Non-pressure chronic ulcer of other part of left lower leg with fat layer exposed Localized edema Venous insufficiency (chronic) (peripheral) Long term (current) use of systemic steroids Plan Discharge From [facility]  Services: Discharge from [place]!!!!!! Edema Control - Lymphedema / SCD / Other: Elevate legs to the level of the heart or above for 30 minutes daily and/or when sitting for 3-4 times a day throughout the day. Avoid standing for long periods of time. Patient to wear own compression stockings every day. Moisturize legs daily. 04/27/2022: All of her wounds are healed. She is already wearing her juxta lite stocking on her right leg. We helped her apply  the juxta lite stocking to the left leg. She was reminded of the importance of wearing these on a daily basis and keeping her legs elevated is much as possible throughout the day and particularly at [place], [name] ([id number]) [id number] Page 5 of 6 night when she is sleeping. We will discharge her from the wound care center. She may follow-up as needed. Electronic Signature(s) Signed: 04/27/2022 11:40:46 AM By: Fredirick Maudlin MD FACS Entered By: Fredirick Maudlin on 04/27/2022 11:40:46 -------------------------------------------------------------------------------- HxROS Details Patient Name: Date of Service: [name], [name] 04/27/2022 10:45 A M Medical Record Number: [id number] Patient Account Number: [id number] Date of Birth/Sex: Treating RN: 1930/07/27 (89 y.o. F) Primary Care Provider: [name] Other Clinician: Referring Provider: Treating Provider/Extender: Jethro Bolus in Treatment: 5 Information Obtained From Patient Eyes Medical History: Positive for: Cataracts Hematologic/Lymphatic Medical History: Positive for: Lymphedema Respiratory Medical History: Positive for: Sleep Apnea Cardiovascular Medical History: Positive for: Coronary Artery Disease; Hypertension Gastrointestinal Medical History: Positive for: Hepatitis B - 50+ yrs ago Past Medical History Notes: diverticulosis, GERD Musculoskeletal Medical History: Positive for:  Osteoarthritis Past Medical History Notes: osteoporosis HBO Extended History Items Eyes: Cataracts Immunizations Pneumococcal Vaccine: Received Pneumococcal Vaccination: Yes Received Pneumococcal Vaccination On or After 60th Birthday: Yes Implantable Devices No devices added Hospitalization / Surgery History Type of Hospitalization/Surgery abdominal hysterectomy cardiac catheterization cataract extraction bilat. colonoscopy bilateral total knee replacement [name], [name] ([id number]) [id number] Page 6 of 6 upper GI endoscopy Family and Social History Cancer: No; Diabetes: No; Heart Disease: Yes - Father; Hereditary Spherocytosis: No; Hypertension: Yes - Mother; Kidney Disease: No; Lung Disease: No; Seizures: No; Stroke: No; Thyroid Problems: No; Tuberculosis: No; Never smoker; Marital Status - Married; Alcohol Use: Rarely; Drug Use: No History; Caffeine Use: Moderate; Financial Concerns: No; Food, Clothing or Shelter Needs: No; Support System Lacking: No; Transportation Concerns: No Electronic Signature(s) Signed: 04/27/2022 11:51:49 AM By: Fredirick Maudlin MD FACS Entered By: Fredirick Maudlin on 04/27/2022 11:39:35 -------------------------------------------------------------------------------- SuperBill Details Patient Name: Date of Service: [name], [place] RO [name] 04/27/2022 Medical Record Number: [id number] Patient Account Number: [id number] Date of Birth/Sex: Treating RN: 13-Oct-1930 (89 y.o. [name] Primary Care Provider: [name] Other Clinician: Referring Provider: Treating Provider/Extender: Jethro Bolus in Treatment: 5 Diagnosis Coding ICD-10 Codes Code Description [phone] Non-pressure chronic ulcer of other part of left lower leg with fat layer exposed R60.0 Localized edema I87.2 Venous insufficiency (chronic) (peripheral) Z79.52 Long term (current) use of systemic steroids Facility  Procedures : CPT4 Code: [id number] Description: 99213 - WOUND CARE VISIT-LEV 3 EST PT Modifier: Quantity: 1 Physician Procedures : CPT4 Code Description Modifier [id number] - WC PHYS LEVEL 3 - EST PT ICD-10 Diagnosis Description R60.0 Localized edema I87.2 Venous insufficiency (chronic) (peripheral) Z79.52 Long term (current) use of systemic steroids L97.822 Non-pressure  chronic ulcer of other part of left lower leg with fat layer exposed Quantity: 1 Electronic Signature(s) Signed: 04/27/2022 11:43:33 AM By: Fredirick Maudlin MD FACS Entered By: Fredirick Maudlin on 04/27/2022 11:43:33

## 2022-04-28 NOTE — Progress Notes (Signed)
Linda, [name] ([id number]) [id number] Page 1 of 7 Visit Report for 04/27/2022 Arrival Information Details Patient Name: Date of Service: [name], [name] 04/27/2022 10:45 A M Medical Record Number: [id number] Patient Account Number: [id number] Date of Birth/Sex: Treating RN: [birth year] (89 y.o. [name] Primary Care Pace Lamadrid: [name] Other Clinician: Referring Rica Heather: Treating Abcde Oneil/Extender: Jethro Bolus in Treatment: 5 Visit Information History Since Last Visit Added or deleted any medications: No Patient Arrived: [name] Any new allergies or adverse reactions: No Arrival Time: 10:57 Had a fall or experienced change in No Accompanied By: husband activities of daily living that may affect Transfer Assistance: Manual risk of falls: Patient Identification Verified: Yes Signs or symptoms of abuse/neglect since last visito No Secondary Verification Process Completed: Yes Hospitalized since last visit: No Patient Requires Transmission-Based Precautions: No Implantable device outside of the clinic excluding No Patient Has Alerts: No cellular tissue based products placed in the center since last visit: Has Dressing in Place as Prescribed: Yes Pain Present Now: Yes Electronic Signature(s) Signed: 04/27/2022 4:40:02 PM By: [name] Entered By: [name] on 04/27/2022 10:58:12 -------------------------------------------------------------------------------- Clinic Level of Care Assessment Details Patient Name: Date of Service: [name] 04/27/2022 10:45 A M Medical Record Number: [id number] Patient Account Number: [id number] Date of Birth/Sex: Treating RN: [birth year] (89 y.o. [name] Primary Care [name]: [name] Other Clinician: Referring [name]: Treating Alexzandria Massman/Extender: Jethro Bolus in Treatment: 5 Clinic Level of Care  Assessment Items TOOL 4 Quantity Score X- 1 0 Use when only an EandM is performed on FOLLOW-UP visit ASSESSMENTS - Nursing Assessment / Reassessment X- 1 10 Reassessment of Co-morbidities (includes updates in patient status) X- 1 5 Reassessment of Adherence to Treatment Plan ASSESSMENTS - Wound and Skin A ssessment / Reassessment X - Simple Wound Assessment / Reassessment - one wound 1 5 []  - 0 Complex Wound Assessment / Reassessment - multiple wounds []  - 0 Dermatologic / Skin Assessment (not related to wound area) ASSESSMENTS - Focused Assessment X- 1 5 Circumferential Edema Measurements - multi extremities []  - 0 Nutritional Assessment / Counseling / Intervention X- 1 5 Lower Extremity Assessment (monofilament, tuning fork, pulses) []  - 0 Peripheral Arterial Disease Assessment (using hand held doppler) ASSESSMENTS - Ostomy and/or Continence Assessment and Care []  - 0 Incontinence Assessment and Management []  - 0 Ostomy Care Assessment and Management (repouching, etc.) PROCESS - Coordination of Care X - Simple Patient / Family Education for ongoing care 1 H[place]ory, [place] ([id number]) [id number] Page 2 of 7 []  - 0 Complex (extensive) Patient / Family Education for ongoing care X- 1 10 Staff obtains Programmer, systems, Records, T Results / Process Orders est []  - 0 Staff telephones HHA, Nursing Homes / Clarify orders / etc []  - 0 Routine Transfer to another Facility (non-emergent condition) []  - 0 Routine Hospital Admission (non-emergent condition) []  - 0 New Admissions / Biomedical engineer / Ordering NPWT Apligraf, etc. , []  - 0 Emergency Hospital Admission (emergent condition) X- 1 10 Simple Discharge Coordination []  - 0 Complex (extensive) Discharge Coordination PROCESS - Special Needs []  - 0 Pediatric / Minor Patient Management []  - 0 Isolation Patient Management []  - 0 Hearing / Language / Visual special needs []  - 0 Assessment  of Community assistance (transportation, D/C planning, etc.) []  - 0 Additional assistance / Altered mentation []  - 0 Support Surface(s) Assessment (bed, cushion, seat, etc.) INTERVENTIONS - Wound Cleansing / Measurement X - Simple Wound Cleansing -  one wound 1 5 []  - 0 Complex Wound Cleansing - multiple wounds X- 1 5 Wound Imaging (photographs - any number of wounds) []  - 0 Wound Tracing (instead of photographs) []  - 0 Simple Wound Measurement - one wound []  - 0 Complex Wound Measurement - multiple wounds INTERVENTIONS - Wound Dressings []  - 0 Small Wound Dressing one or multiple wounds X- 1 15 Medium Wound Dressing one or multiple wounds []  - 0 Large Wound Dressing one or multiple wounds []  - 0 Application of Medications - topical []  - 0 Application of Medications - injection INTERVENTIONS - Miscellaneous []  - 0 External ear exam []  - 0 Specimen Collection (cultures, biopsies, blood, body fluids, etc.) []  - 0 Specimen(s) / Culture(s) sent or taken to Lab for analysis []  - 0 Patient Transfer (multiple staff / Civil Service fast streamer / Similar devices) []  - 0 Simple Staple / Suture removal (25 or less) []  - 0 Complex Staple / Suture removal (26 or more) []  - 0 Hypo / Hyperglycemic Management (close monitor of Blood Glucose) []  - 0 Ankle / Brachial Index (ABI) - do not check if billed separately X- 1 5 Vital Signs Has the patient been seen at the hospital within the last three years: Yes Total Score: 95 Level Of Care: New/Established - Level 3 Electronic Signature(s) Signed: 04/27/2022 4:40:02 PM By: [name] Entered By: [name] on 04/27/2022 11:21:27 [name] ([id number]) [id number] Page 3 of 7 -------------------------------------------------------------------------------- Encounter Discharge Information Details Patient Name: Date of Service: [name], [name] 04/27/2022 10:45 A M Medical Record Number: [id number] Patient  Account Number: [id number] Date of Birth/Sex: Treating RN: [birth year] (89 y.o. [name] Primary Care Amal Saiki: [name] Other Clinician: Referring Lutisha Knoche: Treating Anecia Nusbaum/Extender: Jethro Bolus in Treatment: 5 Encounter Discharge Information Items Discharge Condition: Stable Ambulatory Status: Walker Discharge Destination: Home Transportation: Private Auto Accompanied By: husband Schedule Follow-up Appointment: No Clinical Summary of Care: Patient Declined Electronic Signature(s) Signed: 04/27/2022 4:40:02 PM By: [name] Entered By: [name] on 04/27/2022 11:22:31 -------------------------------------------------------------------------------- Lower Extremity Assessment Details Patient Name: Date of Service: [name], [name] 04/27/2022 10:45 A M Medical Record Number: [id number] Patient Account Number: [id number] Date of Birth/Sex: Treating RN: Feb 17, 1930 (89 y.o. [name] Primary Care Sena Clouatre: [name] Other Clinician: Referring Ahmar Pickrell: Treating Rage Beever/Extender: Jethro Bolus in Treatment: 5 Edema Assessment Assessed: Shirlyn Goltz: No] Patrice Paradise: No] [Left: Edema] [Right: :] Calf Left: Right: Point of Measurement: From Medial Instep 32.3 cm 32.5 cm Ankle Left: Right: Point of Measurement: From Medial Instep 18.5 cm 19 cm Vascular Assessment Pulses: Dorsalis Pedis Palpable: [Left:Yes] Electronic Signature(s) Signed: 04/27/2022 4:40:02 PM By: [name] Entered By: [name] on 04/27/2022 11:03:26 -------------------------------------------------------------------------------- Multi Wound Chart Details Patient Name: Date of Service: [name], [name] 04/27/2022 10:45 A M Medical Record Number: [id number] Patient Account Number: [id number] Date of Birth/Sex: Treating RN: [birth year] (89 y.o. F) Primary Care Mateus Rewerts: [name] Other  Clinician: Referring Zyrus Hetland: Treating Mandy Fitzwater/Extender: Jethro Bolus in Treatment: [place], [place] ([id number]) [id number] Page 4 of 7 Vital Signs Height(in): 68 Pulse(bpm): 69 Weight(lbs): 180 Blood Pressure(mmHg): 142/74 Body Mass Index(BMI): 27.4 Temperature(F): 97.6 Respiratory Rate(breaths/min): 16 [2:Photos:] [N/A:N/A] Left, Proximal, Lateral Lower Leg N/A N/A Wound Location: Gradually Appeared N/A N/A Wounding Event: Lymphedema N/A N/A Primary Etiology: Cataracts, Lymphedema, Sleep N/A N/A Comorbid History: Apnea, Coronary Artery Disease, Hypertension, Hepatitis B, Osteoarthritis 03/02/2022 N/A N/A Date Acquired: 5 N/A N/A Weeks of Treatment:  Open N/A N/A Wound Status: No N/A N/A Wound Recurrence: 0x0x0 N/A N/A Measurements L x W x D (cm) 0 N/A N/A A (cm) : rea 0 N/A N/A Volume (cm) : 100.00% N/A N/A % Reduction in Area: 100.00% N/A N/A % Reduction in Volume: Full Thickness Without Exposed N/A N/A Classification: Support Structures None Present N/A N/A Exudate Amount: Distinct, outline attached N/A N/A Wound Margin: None Present (0%) N/A N/A Granulation Amount: None Present (0%) N/A N/A Necrotic Amount: Fascia: No N/A N/A Exposed Structures: Fat Layer (Subcutaneous Tissue): No Tendon: No Muscle: No Joint: No Bone: No Large (67-100%) N/A N/A Epithelialization: No Abnormalities Noted N/A N/A Periwound Skin Texture: No Abnormalities Noted N/A N/A Periwound Skin Moisture: Hemosiderin Staining: Yes N/A N/A Periwound Skin Color: Rubor: Yes No Abnormality N/A N/A Temperature: Treatment Notes Wound #2 (Lower Leg) Wound Laterality: Left, Lateral, Proximal Cleanser Peri-Wound Care Topical Primary Dressing Secondary Dressing Secured With Compression Wrap Compression Stockings Add-Ons Electronic Signature(s) Signed: 04/27/2022 11:38:27 AM By: Fredirick Maudlin MD FACS Entered By:  Fredirick Maudlin on 04/27/2022 11:38:27 Watkin, [name] ([id number]) [id number] Page 5 of 7 -------------------------------------------------------------------------------- Multi-Disciplinary Care Plan Details Patient Name: Date of Service: [name], [name] 04/27/2022 10:45 A M Medical Record Number: [id number] Patient Account Number: [id number] Date of Birth/Sex: Treating RN: [birth year] (89 y.o. [name] Primary Care Aimee Heldman: [name] Other Clinician: Referring Aryella Besecker: Treating Nil Xiong/Extender: Jethro Bolus in Treatment: 5 Active Inactive Electronic Signature(s) Signed: 04/27/2022 4:40:02 PM By: Sabas Sous By: [name] on 04/27/2022 11:20:48 -------------------------------------------------------------------------------- Pain Assessment Details Patient Name: Date of Service: [name], [name] 04/27/2022 10:45 A M Medical Record Number: [id number] Patient Account Number: [id number] Date of Birth/Sex: Treating RN: [birth year] (89 y.o. [name] Primary Care Joseandres Mazer: [name] Other Clinician: Referring Lynee Rosenbach: Treating Gavon Majano/Extender: Jethro Bolus in Treatment: 5 Active Problems Location of Pain Severity and Description of Pain Patient Has Paino Yes Site Locations Pain Location: Generalized Pain Duration of the Pain. Constant / Intermittento Constant Rate the pain. Current Pain Level: 7 Character of Pain Describe the Pain: Aching Pain Management and Medication Current Pain Management: Electronic Signature(s) Signed: 04/27/2022 4:40:02 PM By: [name] Entered By: [name] on 04/27/2022 10:58:50 -------------------------------------------------------------------------------- Patient/Caregiver Education Details Patient Name: Date of Service: [name], [name] 3/28/2024andnbsp10:45 [place] Record Number:  [id number] Patient Account Number: [id number] VANNESA, FOGELBERG ([id number]) [id number] Page 6 of 7 Date of Birth/Gender: Treating RN: Jan 25, 1931 (89 y.o. [name] Primary Care Physician: [name] Other Clinician: Referring Physician: Treating Physician/Extender: Jethro Bolus in Treatment: 5 Education Assessment Education Provided To: Patient Education Topics Provided Safety: Methods: Explain/Verbal Responses: Reinforcements needed, State content correctly Electronic Signature(s) Signed: 04/27/2022 4:40:02 PM By: [name] Entered By: [name] on 04/27/2022 11:21:06 -------------------------------------------------------------------------------- Wound Assessment Details Patient Name: Date of Service: [name], [name] 04/27/2022 10:45 A M Medical Record Number: [id number] Patient Account Number: [id number] Date of Birth/Sex: Treating RN: [birth year] (89 y.o. [name] Primary Care Tanijah Morais: [name] Other Clinician: Referring Malijah Lietz: Treating Edona Schreffler/Extender: Jethro Bolus in Treatment: 5 Wound Status Wound Number: 2 Primary Lymphedema Etiology: Wound Location: Left, Proximal, Lateral Lower Leg Wound Open Wounding Event: Gradually Appeared Status: Date Acquired: 03/02/2022 Comorbid Cataracts, Lymphedema, Sleep Apnea, Coronary Artery Disease, Weeks Of Treatment: 5 History: Hypertension, Hepatitis B, Osteoarthritis Clustered Wound: No Photos Wound Measurements Length: (cm) Width: (cm) Depth: (cm) Area: (cm) Volume: (cm) 0 %  Reduction in Area: 100% 0 % Reduction in Volume: 100% 0 Epithelialization: Large (67-100%) 0 Tunneling: No 0 Undermining: No Wound Description Classification: Full Thickness Without Exposed Suppo Wound Margin: Distinct, outline attached Exudate Amount: None Present rt Structures Foul Odor After Cleansing:  No Slough/Fibrino No Wound Bed Granulation Amount: None Present (0%) Exposed Structure Necrotic Amount: None Present (0%) Fascia Exposed: No Fat Layer (Subcutaneous Tissue) Exposed: No Tendon Exposed: No [place], Chan ([id number]) [id number] Page 7 of 7 Muscle Exposed: No Joint Exposed: No Bone Exposed: No Periwound Skin Texture Texture Color No Abnormalities Noted: Yes No Abnormalities Noted: No Hemosiderin Staining: Yes Moisture Rubor: Yes No Abnormalities Noted: Yes Temperature / Pain Temperature: No Abnormality Electronic Signature(s) Signed: 04/27/2022 4:40:02 PM By: [name] Entered By: [name] on 04/27/2022 11:07:15 -------------------------------------------------------------------------------- Vitals Details Patient Name: Date of Service: [name], [name] 04/27/2022 10:45 A M Medical Record Number: [id number] Patient Account Number: [id number] Date of Birth/Sex: Treating RN: Feb 13, 1930 (89 y.o. [name] Primary Care Khup Sapia: [name] Other Clinician: Referring Levita Monical: Treating Birdie Fetty/Extender: Jethro Bolus in Treatment: 5 Vital Signs Time Taken: 11:02 Temperature (F): 97.6 Height (in): 68 Pulse (bpm): 69 Weight (lbs): 180 Respiratory Rate (breaths/min): 16 Body Mass Index (BMI): 27.4 Blood Pressure (mmHg): 142/74 Reference Range: 80 - 120 mg / dl Electronic Signature(s) Signed: 04/27/2022 4:40:02 PM By: [name] Entered By: [name] on 04/27/2022 11:02:18

## 2022-05-08 ENCOUNTER — Ambulatory Visit (HOSPITAL_BASED_OUTPATIENT_CLINIC_OR_DEPARTMENT_OTHER): Payer: Medicare Other | Admitting: General Surgery

## 2022-05-22 ENCOUNTER — Encounter (HOSPITAL_BASED_OUTPATIENT_CLINIC_OR_DEPARTMENT_OTHER): Payer: Medicare Other | Attending: General Surgery | Admitting: General Surgery

## 2022-05-22 DIAGNOSIS — Z7952 Long term (current) use of systemic steroids: Secondary | ICD-10-CM | POA: Insufficient documentation

## 2022-05-22 DIAGNOSIS — L97822 Non-pressure chronic ulcer of other part of left lower leg with fat layer exposed: Secondary | ICD-10-CM | POA: Insufficient documentation

## 2022-05-22 DIAGNOSIS — R600 Localized edema: Secondary | ICD-10-CM | POA: Insufficient documentation

## 2022-05-22 DIAGNOSIS — L97512 Non-pressure chronic ulcer of other part of right foot with fat layer exposed: Secondary | ICD-10-CM | POA: Diagnosis present

## 2022-05-22 DIAGNOSIS — I872 Venous insufficiency (chronic) (peripheral): Secondary | ICD-10-CM | POA: Insufficient documentation

## 2022-05-22 DIAGNOSIS — L97812 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ed: Secondary | ICD-10-CM | POA: Insufficient documentation

## 2022-05-23 NOTE — Progress Notes (Signed)
CAMARY, SOSA ([id number]) [id number] Page 1 of 10 Visit Report for 05/22/2022 Arrival Information Details Patient Name: Date of Service: [name], [name] 05/22/2022 8:15 A M Medical Record Number: [id number] Patient Account Number: [id number] Date of Birth/Sex: Treating RN: November [birth year], [name] (89 y.o. [name], [name] Primary Care Alexina Niccoli: Romeo Rabon Other Clinician: Referring Shareece Bultman: Treating Reynaldo Rossman/Extender: [name] in Treatment: 9 Visit Information History Since Last Visit Added or deleted any medications: No Patient Arrived: [name] Any new allergies or adverse reactions: No Arrival Time: 08:26 Had a fall or experienced change in No Accompanied By: daughter activities of daily living that may affect Transfer Assistance: Manual risk of falls: Patient Identification Verified: Yes Signs or symptoms of abuse/neglect since last visito No Secondary Verification Process Completed: Yes Hospitalized since last visit: No Patient Requires Transmission-Based Precautions: No Implantable device outside of the clinic excluding No Patient Has Alerts: No cellular tissue based products placed in the center since last visit: Has Dressing in Place as Prescribed: Yes Has Compression in Place as Prescribed: Yes Pain Present Now: No Notes wearing juxtalites both legs Electronic Signature(s) Signed: 05/22/2022 5:51:03 PM By: Zenaida Deed RN, BSN Entered By: Zenaida Deed on [name]/22/2024 08:30:35 -------------------------------------------------------------------------------- Compression Therapy Details Patient Name: Date of Service: [name], [name] 05/22/2022 8:15 A M Medical Record Number: [id number] Patient Account Number: [id number] Date of Birth/Sex: Treating RN: [birth year][name] (89 y.o. [name] Primary Care Marie Borowski: Romeo Rabon Other Clinician: Referring Treonna Klee: Treating Future Yeldell/Extender: [name] in Treatment: 9 Compression Therapy Performed for Wound Assessment: Wound #7 Left,Lateral Lower Leg Performed By: Clinician Zenaida Deed, RN Compression Type: Double Layer Post Procedure Diagnosis Same as Pre-procedure Notes Stephannie Li Electronic Signature(s) Signed: 05/22/2022 5:51:03 PM By: Zenaida Deed RN, BSN Entered By: Zenaida Deed on [name]/22/2024 08:58:[name] -------------------------------------------------------------------------------- Compression Therapy Details Patient Name: Date of Service: [name], [name] 05/22/2022 8:15 A M Medical Record Number: [id number] Patient Account Number: [id number] Date of Birth/Sex: Treating RN: [birth year] (89 y.o. [name] Primary Care Ramie Palladino: Romeo Rabon Other Clinician: Referring Keyly Baldonado: Treating Rosaline Ezekiel/Extender: [name] in Treatment: [address], [place] ([id number]) [id number] Page 2 of 10 Compression Therapy Performed for Wound Assessment: NonWound Condition Lymphedema - Right Leg Performed By: Clinician Zenaida Deed, RN Compression Type: Double Layer Post Procedure Diagnosis Same as Pre-procedure Notes urgo lite Electronic Signature(s) Signed: 05/22/2022 5:51:03 PM By: Zenaida Deed RN, BSN Entered By: Zenaida Deed on [name]/22/2024 09:08:32 -------------------------------------------------------------------------------- Encounter Discharge Information Details Patient Name: Date of Service: [name], [name] 05/22/2022 8:15 A M Medical Record Number: [id number] Patient Account Number: [id number] Date of Birth/Sex: Treating RN: [name][birth year] (89 y.o. [name] Primary Care Milisa Kimbell: Romeo Rabon Other Clinician: Referring Jailyn Langhorst: Treating Youa Deloney/Extender: [name] in Treatment: 9 Encounter Discharge Information Items Post Procedure Vitals Discharge Condition:  Stable Temperature (F): 97.8 Ambulatory Status: Walker Pulse (bpm): 75 Discharge Destination: Home Respiratory Rate (breaths/min): 18 Transportation: Private Auto Blood Pressure (mmHg): 151/79 Accompanied By: daughter Schedule Follow-up Appointment: Yes Clinical Summary of Care: Patient Declined Electronic Signature(s) Signed: 05/22/2022 5:51:03 PM By: Zenaida Deed RN, BSN Entered By: Zenaida Deed on [name]/22/2024 17:01:10 -------------------------------------------------------------------------------- Lower Extremity Assessment Details Patient Name: Date of Service: [name], [name] 05/22/2022 8:15 A M Medical Record Number: [id number] Patient Account Number: [id number] Date of Birth/Sex: Treating RN: [birth year] (89 y.o. [name] Primary Care Tamelia Michalowski: Romeo Rabon Other Clinician: Referring Ami Thornsberry: Treating Calin Ellery/Extender: Sandi Mariscal,  Lamar Sprinkles in Treatment: 9 Edema Assessment Assessed: [Left: No] [Right: No] Edema: [Left: Yes] [Right: Yes] Calf Left: Right: Point of Measurement: From Medial Instep 31.5 cm 32.5 cm Ankle Left: Right: Point of Measurement: From Medial Instep 22.5 cm 22.5 cm Vascular Assessment Pulses: Dorsalis Pedis Palpable: [Left:Yes] [Right:Yes] [name], [name] ([id number]) [Right:[id number] Page 3 of 10] Electronic Signature(s) Signed: 05/22/2022 5:51:03 PM By: Zenaida Deed RN, BSN Entered By: Zenaida Deed on [name]/22/2024 08:37:14 -------------------------------------------------------------------------------- Multi Wound Chart Details Patient Name: Date of Service: [name], [name] 05/22/2022 8:15 A M Medical Record Number: [id number] Patient Account Number: [id number] Date of Birth/Sex: Treating RN: [birth year] (89 y.o. F) Primary Care Gerritt Galentine: Romeo Rabon Other Clinician: Referring Azusena Erlandson: Treating Renaldo Gornick/Extender: [name] in Treatment:  9 Vital Signs Height(in): 68 Pulse(bpm): 75 Weight(lbs): 180 Blood Pressure(mmHg): 151/79 Body Mass Index(BMI): 27.4 Temperature(F): 97.8 Respiratory Rate(breaths/min): 16 [7:Photos: No Photos Left, Lateral Lower Leg Wound Location: Blister Wounding Event: Venous Leg Ulcer Primary Etiology: Lymphedema Secondary Etiology: Cataracts, Lymphedema, Sleep Comorbid History: Apnea, Coronary Artery Disease, Hypertension, Hepatitis B,  Osteoarthritis 05/15/2022 Date Acquired: 0 Weeks of Treatment: Open Wound Status: No Wound Recurrence: 5.4x4x0.1 Measurements [name] x W x D (cm) 16.965 A (cm) : rea 1.696 Volume (cm) : Full Thickness Without Exposed Classification: Support Structures Medium  Exudate A mount: Serosanguineous Exudate Type: red, brown Exudate Color: Flat and Intact Wound Margin: Small (1-33%) Granulation A mount: Red Granulation Quality: Large (67-100%) Necrotic A mount: Fat Layer (Subcutaneous Tissue): Yes Fat Layer  (Subcutaneous Tissue): Yes Fat Layer (Subcutaneous Tissue): Yes Exposed Structures: Fascia: No Tendon: No Muscle: No Joint: No Bone: No Small (1-33%) Epithelialization: Debridement - Selective/Open Wound Debridement - Selective/Open Wound Debridement -  Selective/Open Wound Debridement: Pre-procedure Verification/Time Out 08:55 Taken: Lidocaine 4% Topical Solution Pain Control: Necrotic/Eschar, Slough Tissue Debrided: Non-Viable Tissue Level: 16.96 Debridement A (sq cm): rea Curette Instrument: Minimum  Bleeding: Pressure Hemostasis A chieved: 1 Procedural Pain: 1 Post Procedural Pain: Procedure was tolerated well Debridement Treatment Response: 5.4x4x0.1 Post Debridement Measurements [name] x W x D (cm) 1.696 Post Debridement Volume: (cm) N/A Post  Debridement Stage: No Abnormalities Noted Periwound Skin Texture: No Abnormalities Noted Periwound Skin Moisture: Ecchymosis: Yes Periwound Skin Color: No Abnormality Temperature: Yes Tenderness on Palpation: Compression Therapy Procedures  Performed:  Debridement] [8:No Photos Left, Anterior Lower Leg Blister Lymphedema Venous Leg Ulcer Cataracts, Lymphedema, Sleep Apnea, Coronary Artery Disease, Hypertension, Hepatitis B, Osteoarthritis 05/15/2022 0 Open No 1x2.8x0.1 2.199 0.22 Full Thickness Without  Exposed Support Structures Medium Serosanguineous red, brown Flat and Intact Large (67-100%) Red Small (1-33%) Fascia: No Tendon: No Muscle: No Joint: No Bone: No Small (1-33%) 08:55 Lidocaine 4% Topical Solution Slough Non-Viable Tissue 2.2 Curette  Minimum Pressure 1 1 Procedure was tolerated well 1x2.8x0.1 0.22 N/A No Abnormalities Noted No Abnormalities Noted Hemosiderin Staining: Yes No Abnormality Yes] [9:No Photos Right, Medial T Second oe Gradually Appeared Pressure Ulcer N/A Cataracts,  Lymphedema, Sleep Apnea, Coronary Artery Disease, Hypertension, Hepatitis B, Osteoarthritis 04/30/2020 0 Open No 0.2x0.2x0.1 0.031 0.003 Category/Stage III Small Serous amber Distinct, outline attached Large (67-100%) Pink None Present (0%) Fascia: No  Tendon: No Muscle: No Joint: No Bone: No Small (1-33%) 08:55 Lidocaine 4% T opical Solution Callus, Slough Non-Viable Tissue 0.03 Curette Minimum Pressure 1 1 Procedure was tolerated well 0.2x0.2x0.1 0.003 Category/Stage III Callus: Yes No Abnormalities  Noted No Abnormalities Noted N/A N/A Debridement [id number] Page 4 of 10] Treatment Notes Electronic Signature(s) Signed: 05/22/2022 9:50:32 AM By: Lady Gary,  Victorino Dike MD FACS Entered By: Duanne Guess on [name]/22/2024 09:50:32 -------------------------------------------------------------------------------- Multi-Disciplinary Care Plan Details Patient Name: Date of Service: [name], [place] 05/22/2022 8:15 A M Medical Record Number: [id number] Patient Account Number: [id number] Date of Birth/Sex: Treating RN: 11-Jan-[name] (89 y.o. [name] Primary Care Valicia Rief: Romeo Rabon Other Clinician: Referring  Ryanna Teschner: Treating Cote Mayabb/Extender: [name] in Treatment: 9 Multidisciplinary Care Plan reviewed with physician Active Inactive Venous Leg Ulcer Nursing Diagnoses: Knowledge deficit related to disease process and management Potential for venous Insuffiency (use before diagnosis confirmed) Goals: Patient will maintain optimal edema control Date Initiated: 05/22/2022 Target Resolution Date: 06/19/2022 Goal Status: Active Interventions: Assess peripheral edema status every visit. Compression as ordered Provide education on venous insufficiency Treatment Activities: Therapeutic compression applied : 05/22/2022 Notes: Wound/Skin Impairment Nursing Diagnoses: Impaired tissue integrity Knowledge deficit related to ulceration/compromised skin integrity Goals: Patient/caregiver will verbalize understanding of skin care regimen Date Initiated: 03/20/2022 Target Resolution Date: 06/19/2022 Goal Status: Active Interventions: Assess ulceration(s) every visit Treatment Activities: Skin care regimen initiated : 03/20/2022 Topical wound management initiated : 03/20/2022 Notes: Electronic Signature(s) Signed: 05/22/2022 5:51:03 PM By: Zenaida Deed RN, BSN Entered By: Zenaida Deed on [name]/22/2024 09:01:01 Pain Assessment Details -------------------------------------------------------------------------------- [name] ([id number]) [id number] Page 5 of 10 Patient Name: Date of Service: [name], [place] RO [name] 05/22/2022 8:15 A M Medical Record Number: [id number] Patient Account Number: [id number] Date of Birth/Sex: Treating RN: [birth year][name] (89 y.o. [name] Primary Care Marino Rogerson: Romeo Rabon Other Clinician: Referring Thorsten Climer: Treating Naleyah Ohlinger/Extender: [name] in Treatment: 9 Active Problems Location of Pain Severity and Description of Pain Patient Has Paino No Site  Locations Rate the pain. Current Pain Level: 0 Pain Management and Medication Current Pain Management: Electronic Signature(s) Signed: 05/22/2022 4:01:39 PM By: Samuella Bruin Entered By: Samuella Bruin on [name]/22/2024 08:26:58 -------------------------------------------------------------------------------- Patient/Caregiver Education Details Patient Name: Date of Service: [name], [name] 4/22/2024andnbsp8:15 A M Medical Record Number: [id number] Patient Account Number: [id number] Date of Birth/Gender: Treating RN: [name][birth year] (89 y.o. [name] Primary Care Physician: Romeo Rabon Other Clinician: Referring Physician: Treating Physician/Extender: [name] in Treatment: 9 Education Assessment Education Provided To: Patient Education Topics Provided Venous: Methods: Explain/Verbal Responses: Reinforcements needed, State content correctly Wound/Skin Impairment: Methods: Explain/Verbal Responses: Reinforcements needed, State content correctly Electronic Signature(s) Signed: 05/22/2022 5:51:03 PM By: Zenaida Deed RN, BSN Entered By: Zenaida Deed on [name]/22/2024 09:01:38 [name] ([id number]) [id number] Page 6 of 10 -------------------------------------------------------------------------------- Wound Assessment Details Patient Name: Date of Service: [name], [place] RO [name] 05/22/2022 8:15 A M Medical Record Number: [id number] Patient Account Number: [id number] Date of Birth/Sex: Treating RN: [name][birth year] (89 y.o. [name] Primary Care Rahaf Carbonell: Romeo Rabon Other Clinician: Referring Evia Goldsmith: Treating Kacey Vicuna/Extender: [name] in Treatment: 9 Wound Status Wound Number: 7 Primary Venous Leg Ulcer Etiology: Wound Location: Left, Lateral Lower Leg Secondary Lymphedema Wounding Event: Blister Etiology: Date Acquired: 05/15/2022 Wound Open Weeks Of  Treatment: 0 Status: Clustered Wound: No Comorbid Cataracts, Lymphedema, Sleep Apnea, Coronary Artery History: Disease, Hypertension, Hepatitis B, Osteoarthritis Photos Wound Measurements Length: (cm) 5.4 Width: (cm) 4 Depth: (cm) 0.1 Area: (cm) 16.965 Volume: (cm) 1.696 % Reduction in Area: % Reduction in Volume: Epithelialization: Small (1-33%) Tunneling: No Undermining: No Wound Description Classification: Full Thickness Without Exposed Support Wound Margin: Flat and Intact Exudate Amount: Medium Exudate Type: Serosanguineous Exudate Color: red, brown Structures Foul Odor After Cleansing: No Slough/Fibrino Yes Wound Bed Granulation  Amount: Small (1-33%) Exposed Structure Granulation Quality: Red Fascia Exposed: No Necrotic Amount: Large (67-100%) Fat Layer (Subcutaneous Tissue) Exposed: Yes Necrotic Quality: Adherent Slough Tendon Exposed: No Muscle Exposed: No Joint Exposed: No Bone Exposed: No Periwound Skin Texture Texture Color No Abnormalities Noted: Yes No Abnormalities Noted: No Ecchymosis: Yes Moisture No Abnormalities Noted: Yes Temperature / Pain Temperature: No Abnormality Tenderness on Palpation: Yes Treatment Notes Wound #7 (Lower Leg) Wound Laterality: Left, Lateral Cleanser Peri-Wound Care Sween Lotion (Moisturizing lotion) Discharge Instruction: Apply moisturizing lotion as directed Linda, [name] ([id number]) [phone].pdf Page 7 of 10 Topical Primary Dressing Maxorb Extra Ag+ Alginate Dressing, 4x4.75 (in/in) Discharge Instruction: Apply to wound bed as instructed Secondary Dressing ABD Pad, 8x10 Discharge Instruction: Apply over primary dressing as directed. Secured With Compression Wrap Urgo K2 Lite, two layer compression system, regular Discharge Instruction: Apply Urgo K2 Lite as directed (alternative to 3 layer compression). Compression Stockings Add-Ons Electronic Signature(s) Signed: 05/22/2022  5:51:03 PM By: Zenaida Deed RN, BSN Entered By: Zenaida Deed on [name]/22/2024 10:15:07 -------------------------------------------------------------------------------- Wound Assessment Details Patient Name: Date of Service: [name], [name] 05/22/2022 8:15 A M Medical Record Number: [id number] Patient Account Number: [id number] Date of Birth/Sex: Treating RN: [name], [name] (89 y.o. [name] Primary Care Valla Pacey: Romeo Rabon Other Clinician: Referring [place]: Treating Marisela Line/Extender: [name] in Treatment: 9 Wound Status Wound Number: 8 Primary Lymphedema Etiology: Wound Location: Left, Anterior Lower Leg Secondary Venous Leg Ulcer Wounding Event: Blister Etiology: Date Acquired: 05/15/2022 Wound Open Weeks Of Treatment: 0 Status: Clustered Wound: No Comorbid Cataracts, Lymphedema, Sleep Apnea, Coronary Artery History: Disease, Hypertension, Hepatitis B, Osteoarthritis Photos Wound Measurements Length: (cm) 1 Width: (cm) 2.8 Depth: (cm) 0.1 Area: (cm) 2.199 Volume: (cm) 0.22 % Reduction in Area: % Reduction in Volume: Epithelialization: Small (1-33%) Tunneling: No Undermining: No Wound Description Classification: Full Thickness Without Exposed Support Wound Margin: Flat and Intact Exudate Amount: Medium Exudate Type: Serosanguineous Exudate Color: red, brown Structures Foul Odor After Cleansing: No Slough/Fibrino Yes Wound Bed [place], [name] ([id number]) [id number] Page 8 of 10 Granulation Amount: Large (67-100%) Exposed Structure Granulation Quality: Red Fascia Exposed: No Necrotic Amount: Small (1-33%) Fat Layer (Subcutaneous Tissue) Exposed: Yes Necrotic Quality: Adherent Slough Tendon Exposed: No Muscle Exposed: No Joint Exposed: No Bone Exposed: No Periwound Skin Texture Texture Color No Abnormalities Noted: Yes No Abnormalities Noted: No Hemosiderin Staining:  Yes Moisture No Abnormalities Noted: Yes Temperature / Pain Temperature: No Abnormality Tenderness on Palpation: Yes Treatment Notes Wound #8 (Lower Leg) Wound Laterality: Left, Anterior Cleanser Peri-Wound Care Sween Lotion (Moisturizing lotion) Discharge Instruction: Apply moisturizing lotion as directed Topical Primary Dressing Maxorb Extra Ag+ Alginate Dressing, 4x4.75 (in/in) Discharge Instruction: Apply to wound bed as instructed Secondary Dressing ABD Pad, 8x10 Discharge Instruction: Apply over primary dressing as directed. Secured With Compression Wrap Urgo K2 Lite, two layer compression system, regular Discharge Instruction: Apply Urgo K2 Lite as directed (alternative to 3 layer compression). Compression Stockings Add-Ons Electronic Signature(s) Signed: 05/22/2022 5:51:03 PM By: Zenaida Deed RN, BSN Entered By: Zenaida Deed on [name]/22/2024 10:15:52 -------------------------------------------------------------------------------- Wound Assessment Details Patient Name: Date of Service: [name], [name] 05/22/2022 8:15 A M Medical Record Number: [id number] Patient Account Number: [id number] Date of Birth/Sex: Treating RN: [name][birth year] (89 y.o. [name] Primary Care Angelo Caroll: Romeo Rabon Other Clinician: Referring Trenice Mesa: Treating Yoni Lobos/Extender: [name] in Treatment: 9 Wound Status Wound Number: 9 Primary Pressure Ulcer Etiology: Wound Location: Right, Medial T Second oe Wound Open  Wounding Event: Gradually Appeared Status: Date Acquired: 04/30/2020 Comorbid Cataracts, Lymphedema, Sleep Apnea, Coronary Artery Disease, Weeks Of Treatment: 0 History: Hypertension, Hepatitis B, Osteoarthritis Clustered Wound: No Photos [name], [name] ([id number]) [id number] Page 9 of 10 Wound Measurements Length: (cm) 0.2 Width: (cm) 0.2 Depth: (cm) 0.1 Area: (cm) 0.031 Volume: (cm) 0.003 %  Reduction in Area: % Reduction in Volume: Epithelialization: Small (1-33%) Tunneling: No Undermining: No Wound Description Classification: Category/Stage III Wound Margin: Distinct, outline attached Exudate Amount: Small Exudate Type: Serous Exudate Color: amber Foul Odor After Cleansing: No Slough/Fibrino No Wound Bed Granulation Amount: Large (67-100%) Exposed Structure Granulation Quality: Pink Fascia Exposed: No Necrotic Amount: None Present (0%) Fat Layer (Subcutaneous Tissue) Exposed: Yes Tendon Exposed: No Muscle Exposed: No Joint Exposed: No Bone Exposed: No Periwound Skin Texture Texture Color No Abnormalities Noted: No No Abnormalities Noted: Yes Callus: Yes Moisture No Abnormalities Noted: Yes Treatment Notes Wound #9 (Toe Second) Wound Laterality: Right, Medial Cleanser Peri-Wound Care Topical Primary Dressing Maxorb Extra Ag+ Alginate Dressing, 2x2 (in/in) Discharge Instruction: Apply to wound bed as instructed Secondary Dressing Woven Gauze Sponges 2x2 in Discharge Instruction: Apply over primary dressing as directed. Secured With Conforming Stretch Gauze Bandage, Sterile 2x75 (in/in) Discharge Instruction: Secure with stretch gauze as directed. Compression Wrap Compression Stockings Add-Ons Electronic Signature(s) Signed: 05/22/2022 5:51:03 PM By: Zenaida Deed RN, BSN Entered By: Zenaida Deed on [name]/22/2024 10:16:16 [place], [name] ([id number]) [id number] Page 10 of 10 -------------------------------------------------------------------------------- Vitals Details Patient Name: Date of Service: [name], [place] RO [name] 05/22/2022 8:15 A M Medical Record Number: [id number] Patient Account Number: [id number] Date of Birth/Sex: Treating RN: [birth year] (89 y.o. [name] Primary Care Katia Hannen: Romeo Rabon Other Clinician: Referring Nathanial Arrighi: Treating Ritter Helsley/Extender: [name] in  Treatment: 9 Vital Signs Time Taken: 08:26 Temperature (F): 97.8 Height (in): 68 Pulse (bpm): 75 Weight (lbs): 180 Respiratory Rate (breaths/min): 16 Body Mass Index (BMI): 27.4 Blood Pressure (mmHg): 151/79 Reference Range: 80 - 120 mg / dl Electronic Signature(s) Signed: 05/22/2022 4:01:39 PM By: Samuella Bruin Entered By: Samuella Bruin on [name]/22/2024 08:26:53

## 2022-05-23 NOTE — Progress Notes (Signed)
CHARMION, HAPKE ([id number]) [id number] Page 1 of 11 Visit Report for 05/22/2022 Chief Complaint Document Details Patient Name: Date of Service: [name], Manson RO L 05/22/2022 8:15 A M Medical Record Number: [id number] Patient Account Number: [id number] Date of Birth/Sex: Treating RN: April 04, 1930 (89 y.o. F) Primary Care Provider: Romeo Rabon Other Clinician: Referring Provider: Treating Provider/Extender: [name] in Treatment: 9 Information Obtained from: Patient Chief Complaint Patient presents for treatment of open ulcers due to venous insufficiency Electronic Signature(s) Signed: 05/22/2022 9:50:40 AM By: Duanne Guess MD FACS Entered By: Duanne Guess on 05/22/2022 09:50:39 -------------------------------------------------------------------------------- Debridement Details Patient Name: Date of Service: [name], [name] 05/22/2022 8:15 A M Medical Record Number: [id number] Patient Account Number: [id number] Date of Birth/Sex: Treating RN: 1930/11/20 (89 y.o. [name] Primary Care Provider: Romeo Rabon Other Clinician: Referring Provider: Treating Provider/Extender: [name] in Treatment: 9 Debridement Performed for Assessment: Wound #7 Left,Lateral Lower Leg Performed By: Physician Duanne Guess, MD Debridement Type: Debridement Severity of Tissue Pre Debridement: Fat layer exposed Level of Consciousness (Pre-procedure): Awake and Alert Pre-procedure Verification/Time Out Yes - 08:55 Taken: Start Time: 08:57 Pain Control: Lidocaine 4% Topical Solution Percent of Wound Bed Debrided: 100% T Area Debrided (cm): otal 16.96 Tissue and other material debrided: Non-Viable, Eschar, Slough, Slough Level: Non-Viable Tissue Debridement Description: Selective/Open Wound Instrument: Curette Bleeding: Minimum Hemostasis Achieved: Pressure Procedural Pain: 1 Post  Procedural Pain: 1 Response to Treatment: Procedure was tolerated well Level of Consciousness (Post- Awake and Alert procedure): Post Debridement Measurements of Total Wound Length: (cm) 5.4 Width: (cm) 4 Depth: (cm) 0.1 Volume: (cm) 1.696 Character of Wound/Ulcer Post Debridement: Improved Severity of Tissue Post Debridement: Fat layer exposed Post Procedure Diagnosis Same as Pre-procedure Notes scribed for Dr. Lady Gary By Zenaida Deed, RN Electronic Signature(s) Hildreth, [name] ([id number]) [phone].pdf Page 2 of 11 Signed: 05/22/2022 10:07:31 AM By: Duanne Guess MD FACS Signed: 05/22/2022 5:51:03 PM By: Zenaida Deed RN, BSN Entered By: Zenaida Deed on 05/22/2022 09:02:47 -------------------------------------------------------------------------------- Debridement Details Patient Name: Date of Service: [name], [name] 05/22/2022 8:15 A M Medical Record Number: [id number] Patient Account Number: [id number] Date of Birth/Sex: Treating RN: [birth year] (89 y.o. [name] Primary Care Provider: Romeo Rabon Other Clinician: Referring Provider: Treating Provider/Extender: [name] in Treatment: 9 Debridement Performed for Assessment: Wound #8 Left,Anterior Lower Leg Performed By: Physician Duanne Guess, MD Debridement Type: Debridement Severity of Tissue Pre Debridement: Fat layer exposed Level of Consciousness (Pre-procedure): Awake and Alert Pre-procedure Verification/Time Out Yes - 08:55 Taken: Start Time: 08:57 Pain Control: Lidocaine 4% T opical Solution Percent of Wound Bed Debrided: 100% T Area Debrided (cm): otal 2.2 Tissue and other material debrided: Non-Viable, Slough, Slough Level: Non-Viable Tissue Debridement Description: Selective/Open Wound Instrument: Curette Bleeding: Minimum Hemostasis Achieved: Pressure Procedural Pain: 1 Post Procedural Pain: 1 Response to Treatment:  Procedure was tolerated well Level of Consciousness (Post- Awake and Alert procedure): Post Debridement Measurements of Total Wound Length: (cm) 1 Width: (cm) 2.8 Depth: (cm) 0.1 Volume: (cm) 0.22 Character of Wound/Ulcer Post Debridement: Improved Severity of Tissue Post Debridement: Fat layer exposed Post Procedure Diagnosis Same as Pre-procedure Notes scribed for Dr. Lady Gary By Zenaida Deed, RN Electronic Signature(s) Signed: 05/22/2022 10:07:31 AM By: Duanne Guess MD FACS Signed: 05/22/2022 5:51:03 PM By: Zenaida Deed RN, BSN Entered By: Zenaida Deed on 05/22/2022 09:03:24 -------------------------------------------------------------------------------- Debridement Details Patient Name: Date of Service: [name], [name] 05/22/2022 8:15 A M Medical Record  Number: [id number] Patient Account Number: [id number] Date of Birth/Sex: Treating RN: 08/28/1930 (89 y.o. [name] Primary Care Provider: Romeo Rabon Other Clinician: Referring Provider: Treating Provider/Extender: [name] in Treatment: 9 Debridement Performed for Assessment: Wound #9 Right,Medial T Second oe Performed By: Physician Duanne Guess, MD Debridement Type: Debridement Level of Consciousness (Pre-procedure): Awake and Alert Pre-procedure Verification/Time Out Yes - 08:55 Taken: Start Time: 08:57 AKILI, CUDA ([id number]) [id number] Page 3 of 11 Pain Control: Lidocaine 4% Topical Solution Percent of Wound Bed Debrided: 100% T Area Debrided (cm): otal 0.03 Tissue and other material debrided: Non-Viable, Callus, Slough, Slough Level: Non-Viable Tissue Debridement Description: Selective/Open Wound Instrument: Curette Bleeding: Minimum Hemostasis Achieved: Pressure Procedural Pain: 1 Post Procedural Pain: 1 Response to Treatment: Procedure was tolerated well Level of Consciousness (Post- Awake and  Alert procedure): Post Debridement Measurements of Total Wound Length: (cm) 0.2 Stage: Category/Stage III Width: (cm) 0.2 Depth: (cm) 0.1 Volume: (cm) 0.003 Character of Wound/Ulcer Post Debridement: Improved Post Procedure Diagnosis Same as Pre-procedure Notes scribed for Dr. Lady Gary By Zenaida Deed, RN Electronic Signature(s) Signed: 05/22/2022 10:07:31 AM By: Duanne Guess MD FACS Signed: 05/22/2022 5:51:03 PM By: Zenaida Deed RN, BSN Entered By: Zenaida Deed on 05/22/2022 09:04:16 -------------------------------------------------------------------------------- HPI Details Patient Name: Date of Service: [name], [name] 05/22/2022 8:15 A M Medical Record Number: [id number] Patient Account Number: [id number] Date of Birth/Sex: Treating RN: Oct 10, 1930 (89 y.o. F) Primary Care Provider: Romeo Rabon Other Clinician: Referring Provider: Treating Provider/Extender: [name] in Treatment: 9 History of Present Illness HPI Description: ADMISSION 03/20/2022 This is an 89 year old woman with coronary artery disease, on chronic corticosteroids for osteoarthritis, who presents with bilateral lower leg ulcers. They have been present for a couple of weeks. I do not have any vascular studies available to discern whether or not she has had venous reflux or arterial studies done. ABIs in clinic were 1.34 on the left and 0.95 on the right. She is not diabetic. She does not smoke. Her legs are quite edematous and she has multiple hematomas of various sizes and ages extending up to at least the distal thigh. She has 3 ulcers on the left leg with slough and eschar accumulation. On the right leg, she has a tiny superficial ulcer on the anterior tibial surface. She also has a small ulcer on the lateral aspect of her right third toe. 03/27/2022: The ulcer on her right anterior tibial surface and lateral third toe are both healed. The ulcers on her left leg are  smaller but have slough accumulation. Edema control is good. Her juxta lite stockings arrived in the mail, but she did not bring them with her today. 04/06/2022: She has a new skin tear just distal to her left knee. The ulcer on her left upper posterior calf is nearly healed under a layer of eschar. She has a spot on her right second toe that she is concerned about, saying that she frequently gets an ulcer there, but I do not see any open wound in this site. 04/21/2022: All of her wounds are healed except for the skin tear distal to her left knee. There is a layer of eschar on the surface. Underneath, the wound is quite superficial and clean. 04/19/2022: All of her wounds are healed. She is wearing a juxta lite stocking on her right leg and has brought her other stocking for [place] to apply to her left. 05/22/2022: She returns today with new ulcers on her left  leg. A large hematoma/blood blister on her upper lateral leg, just distal to the knee opened up shortly after she was healed out from our clinic. There is slough on the wound surface, but no concern for infection. She has a second ulcer on her anterior tibial surface with slough on the surface. The small site on her right second toe that we have been monitoring has opened. There is some slough in the center with surrounding callus. She has a hematoma/blood blister on the medial aspect of her right lower leg that is not open, but it is certainly threatening to do so. Electronic Signature(s) Signed: 05/22/2022 9:52:30 AM By: Duanne Guess MD FACS Entered By: Duanne Guess on 05/22/2022 09:52:30 Campanilla, Talani ([id number]) [id number] Page 4 of 11 -------------------------------------------------------------------------------- Physical Exam Details Patient Name: Date of Service: [name], Brilliant RO L 05/22/2022 8:15 A M Medical Record Number: [id number] Patient Account Number: [id number] Date of Birth/Sex: Treating  RN: Jun 13, 1930 (89 y.o. F) Primary Care Provider: Romeo Rabon Other Clinician: Referring Provider: Treating Provider/Extender: [name] in Treatment: 9 Constitutional Hypertensive, asymptomatic. . . . no acute distress. Respiratory Normal work of breathing on room air. Notes 05/22/2022: She returns today with new ulcers on her left leg. A large hematoma/blood blister on her upper lateral leg, just distal to the knee opened up shortly after she was healed out from our clinic. There is slough on the wound surface, but no concern for infection. She has a second ulcer on her anterior tibial surface with slough on the surface. The small site on her right second toe that we have been monitoring has opened. There is some slough in the center with surrounding callus. She has a hematoma/blood blister on the medial aspect of her right lower leg that is not open, but it is certainly threatening to do so. Electronic Signature(s) Signed: 05/22/2022 9:53:12 AM By: Duanne Guess MD FACS Entered By: Duanne Guess on 05/22/2022 09:53:12 -------------------------------------------------------------------------------- Physician Orders Details Patient Name: Date of Service: [name], [name] 05/22/2022 8:15 A M Medical Record Number: [id number] Patient Account Number: [id number] Date of Birth/Sex: Treating RN: 10-Oct-1930 (89 y.o. [name] Primary Care Provider: Romeo Rabon Other Clinician: Referring Provider: Treating Provider/Extender: [name] in Treatment: 9 Verbal / Phone Orders: No Diagnosis Coding ICD-10 Coding Code Description [id number] Non-pressure chronic ulcer of other part of left lower leg with fat layer exposed L97.512 Non-pressure chronic ulcer of other part of right foot with fat layer exposed R60.0 Localized edema I87.2 Venous insufficiency (chronic) (peripheral) Z79.52 Long term (current) use of systemic  steroids Follow-up Appointments ppointment in 1 week. - Dr. Lady Gary - Room 3 Return A Anesthetic Wound #7 Left,Lateral Lower Leg (In clinic) Topical Lidocaine 4% applied to wound bed Wound #8 Left,Anterior Lower Leg (In clinic) Topical Lidocaine 4% applied to wound bed Wound #9 Right,Medial T Second oe (In clinic) Topical Lidocaine 4% applied to wound bed Bathing/ Shower/ Hygiene May shower with protection but do not get wound dressing(s) wet. Protect dressing(s) with water repellant cover (for example, large plastic bag) or a cast cover and may then take shower. Edema Control - Lymphedema / SCD / Other Elevate legs to the level of the heart or above for 30 minutes daily and/or when sitting for 3-4 times a day throughout the day. Avoid standing for long periods of time. Exercise regularly Non Wound Condition TOCCARA, ALFORD ([id number]) [id number] Page 5 of 11 Right Lower Extremity pply the following to  affected area as directed: - silver alginate to blood blister and use urgo lite wrap to right lower leg A Wound Treatment Wound #7 - Lower Leg Wound Laterality: Left, Lateral Peri-Wound Care: Sween Lotion (Moisturizing lotion) 1 x Per Week Discharge Instructions: Apply moisturizing lotion as directed Prim Dressing: Maxorb Extra Ag+ Alginate Dressing, 4x4.75 (in/in) 1 x Per Week ary Discharge Instructions: Apply to wound bed as instructed Secondary Dressing: ABD Pad, 8x10 1 x Per Week Discharge Instructions: Apply over primary dressing as directed. Compression Wrap: Urgo K2 Lite, two layer compression system, regular 1 x Per Week Discharge Instructions: Apply Urgo K2 Lite as directed (alternative to 3 layer compression). Wound #8 - Lower Leg Wound Laterality: Left, Anterior Peri-Wound Care: Sween Lotion (Moisturizing lotion) 1 x Per Week Discharge Instructions: Apply moisturizing lotion as directed Prim Dressing: Maxorb Extra Ag+ Alginate Dressing, 4x4.75  (in/in) 1 x Per Week ary Discharge Instructions: Apply to wound bed as instructed Secondary Dressing: ABD Pad, 8x10 1 x Per Week Discharge Instructions: Apply over primary dressing as directed. Compression Wrap: Urgo K2 Lite, two layer compression system, regular 1 x Per Week Discharge Instructions: Apply Urgo K2 Lite as directed (alternative to 3 layer compression). Wound #9 - T Second oe Wound Laterality: Right, Medial Prim Dressing: Maxorb Extra Ag+ Alginate Dressing, 2x2 (in/in) Every Other Day/30 Days ary Discharge Instructions: Apply to wound bed as instructed Secondary Dressing: Woven Gauze Sponges 2x2 in Every Other Day/30 Days Discharge Instructions: Apply over primary dressing as directed. Secured With: Insurance underwriter, Sterile 2x75 (in/in) Every Other Day/30 Days Discharge Instructions: Secure with stretch gauze as directed. Patient Medications llergies: clindamycin, codeine, Panlor (hydrocodone-acetamin), nitrofurantoin, oxycodone, amoxicillin A Notifications Medication Indication Start End prior to debriedement 05/22/2022 lidocaine HCl DOSE topical 4 % cream - cream topical Electronic Signature(s) Signed: 05/22/2022 10:07:31 AM By: Duanne Guess MD FACS Entered By: Duanne Guess on 05/22/2022 09:53:25 -------------------------------------------------------------------------------- Problem List Details Patient Name: Date of Service: [name], [name] 05/22/2022 8:15 A M Medical Record Number: [id number] Patient Account Number: [id number] Date of Birth/Sex: Treating RN: July 23, 1930 (89 y.o. [name] Primary Care Provider: Romeo Rabon Other Clinician: Referring Provider: Treating Provider/Extender: [name] in Treatment: 9 Active Problems ICD-10 Encounter Code Description Active Date MDM Diagnosis [phone] Non-pressure chronic ulcer of other part of left lower leg with fat layer exposed4/22/2024 No  Yes DOTTIE, VAQUERANO ([id number]) [id number] Page 6 of 11 [id number] Non-pressure chronic ulcer of other part of right foot with fat layer exposed 05/22/2022 No Yes R60.0 Localized edema 03/20/2022 No Yes I87.2 Venous insufficiency (chronic) (peripheral) 03/20/2022 No Yes Z79.52 Long term (current) use of systemic steroids 03/20/2022 No Yes Inactive Problems Resolved Problems ICD-10 Code Description Active Date Resolved Date L97.811 Non-pressure chronic ulcer of other part of right lower leg limited to breakdown of skin 03/20/2022 03/20/2022 L97.511 Non-pressure chronic ulcer of other part of right foot limited to breakdown of skin 03/20/2022 03/20/2022 Electronic Signature(s) Signed: 05/22/2022 9:50:24 AM By: Duanne Guess MD FACS Entered By: Duanne Guess on 05/22/2022 09:50:23 -------------------------------------------------------------------------------- Progress Note Details Patient Name: Date of Service: [name], [name] 05/22/2022 8:15 A M Medical Record Number: [id number] Patient Account Number: [id number] Date of Birth/Sex: Treating RN: 01/09/1931 (89 y.o. F) Primary Care Provider: Romeo Rabon Other Clinician: Referring Provider: Treating Provider/Extender: [name] in Treatment: 9 Subjective Chief Complaint Information obtained from Patient Patient presents for treatment of open ulcers due to venous insufficiency History of Present  Illness (HPI) ADMISSION 03/20/2022 This is an 89 year old woman with coronary artery disease, on chronic corticosteroids for osteoarthritis, who presents with bilateral lower leg ulcers. They have been present for a couple of weeks. I do not have any vascular studies available to discern whether or not she has had venous reflux or arterial studies done. ABIs in clinic were 1.34 on the left and 0.95 on the right. She is not diabetic. She does not smoke. Her legs are quite edematous and she  has multiple hematomas of various sizes and ages extending up to at least the distal thigh. She has 3 ulcers on the left leg with slough and eschar accumulation. On the right leg, she has a tiny superficial ulcer on the anterior tibial surface. She also has a small ulcer on the lateral aspect of her right third toe. 03/27/2022: The ulcer on her right anterior tibial surface and lateral third toe are both healed. The ulcers on her left leg are smaller but have slough accumulation. Edema control is good. Her juxta lite stockings arrived in the mail, but she did not bring them with her today. 04/06/2022: She has a new skin tear just distal to her left knee. The ulcer on her left upper posterior calf is nearly healed under a layer of eschar. She has a spot on her right second toe that she is concerned about, saying that she frequently gets an ulcer there, but I do not see any open wound in this site. 04/21/2022: All of her wounds are healed except for the skin tear distal to her left knee. There is a layer of eschar on the surface. Underneath, the wound is quite superficial and clean. 04/19/2022: All of her wounds are healed. She is wearing a juxta lite stocking on her right leg and has brought her other stocking for [place] to apply to her left. 05/22/2022: She returns today with new ulcers on her left leg. A large hematoma/blood blister on her upper lateral leg, just distal to the knee opened up shortly after she was healed out from our clinic. There is slough on the wound surface, but no concern for infection. She has a second ulcer on her anterior tibial Elma, Pebbles ([id number]) [id number] Page 7 of 11 surface with slough on the surface. The small site on her right second toe that we have been monitoring has opened. There is some slough in the center with surrounding callus. She has a hematoma/blood blister on the medial aspect of her right lower leg that is not open, but it is  certainly threatening to do so. Patient History Information obtained from Patient. Family History Heart Disease - Father, Hypertension - Mother, No family history of Cancer, Diabetes, Hereditary Spherocytosis, Kidney Disease, Lung Disease, Seizures, Stroke, Thyroid Problems, Tuberculosis. Social History Never smoker, Marital Status - Married, Alcohol Use - Rarely, Drug Use - No History, Caffeine Use - Moderate. 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 - cardiac catheterization. - cataract extraction bilat.. - colonoscopy. - bilateral total knee replacement. - upper GI endoscopy. Medical A Surgical History Notes nd Gastrointestinal diverticulosis, GERD Musculoskeletal osteoporosis Objective Constitutional Hypertensive, asymptomatic. no acute distress. Vitals Time Taken: 8:26 AM, Height: 68 in, Weight: 180 lbs, BMI: 27.4, Temperature: 97.8 F,  Pulse: 75 bpm, Respiratory Rate: 16 breaths/min, Blood Pressure: 151/79 mmHg. Respiratory Normal work of breathing on room air. General Notes: 05/22/2022: She returns today with new ulcers on her left leg. A large hematoma/blood blister on her upper lateral leg, just distal to the knee opened up shortly after she was healed out from our clinic. There is slough on the wound surface, but no concern for infection. She has a second ulcer on her anterior tibial surface with slough on the surface. The small site on her right second toe that we have been monitoring has opened. There is some slough in the center with surrounding callus. She has a hematoma/blood blister on the medial  aspect of her right lower leg that is not open, but it is certainly threatening to do so. Integumentary (Hair, Skin) Wound #7 status is Open. Original cause of wound was Blister. The date acquired was: 05/15/2022. The wound is located on the Left,Lateral Lower Leg. The wound measures 5.4cm length x 4cm width x 0.1cm depth; 16.965cm^2 area and 1.696cm^3 volume. There is Fat Layer (Subcutaneous Tissue) exposed. There is no tunneling or undermining noted. There is a medium amount of serosanguineous drainage noted. The wound margin is flat and intact. There is small (1-33%) red granulation within the wound bed. There is a large (67-100%) amount of necrotic tissue within the wound bed including Adherent Slough. The periwound skin appearance had no abnormalities noted for texture. The periwound skin appearance had no abnormalities noted for moisture. The periwound skin appearance exhibited: Ecchymosis. Periwound temperature was noted as No Abnormality. The periwound has tenderness on palpation. Wound #8 status is Open. Original cause of wound was Blister. The date acquired was: 05/15/2022. The wound is located on the Left,Anterior Lower Leg. The wound measures 1cm length x 2.8cm width x 0.1cm depth; 2.199cm^2 area and 0.22cm^3 volume. There is Fat Layer (Subcutaneous Tissue) exposed. There is no tunneling or undermining noted. There is a medium amount of serosanguineous drainage noted. The wound margin is flat and intact. There is large (67-100%) red granulation within the wound bed. There is a small (1-33%) amount of necrotic tissue within the wound bed including Adherent Slough. The periwound skin appearance had no abnormalities noted for texture. The periwound skin appearance had no abnormalities noted for moisture. The periwound skin appearance exhibited: Hemosiderin Staining. Periwound temperature was noted as No Abnormality. The periwound has tenderness on palpation. Wound #9 status is Open. Original  cause of wound was Gradually Appeared. The date acquired was: 04/30/2020. The wound is located on the Right,Medial T oe Second. The wound measures 0.2cm length x 0.2cm width x 0.1cm depth; 0.031cm^2 area and 0.003cm^3 volume. There is Fat Layer (Subcutaneous Tissue) exposed. There is no tunneling or undermining noted. There is a small amount of serous drainage noted. The wound margin is distinct with the outline attached to the wound base. There is large (67-100%) pink granulation within the wound bed. There is no necrotic tissue within the wound bed. The periwound skin appearance had no abnormalities noted for moisture. The periwound skin appearance had no abnormalities noted for color. The periwound skin appearance exhibited: Callus. 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 There was a Selective/Open Wound Non-Viable Tissue Debridement with a total area of 16.96 sq cm performed by Duanne Guess, MD. With the following instrument(s): Curette to remove Non-Viable tissue/material. Material removed includes Eschar and Slough and after achieving pain control using Lidocaine 4% Topical Solution. No specimens were taken. A time out was conducted at 08:55, prior to the start of the procedure. A Minimum amount of bleeding was controlled with Pressure. The procedure was tolerated well with a pain level of 1 throughout and a pain level of 1 following the procedure. Post Debridement Measurements:  5.4cm length x 4cm width x 0.1cm depth; 1.696cm^3 volume. Character of Wound/Ulcer Post Debridement is improved. Severity of Tissue Post Debridement is: Fat layer exposed. Post procedure Diagnosis Wound #7: Same as Pre-Procedure General Notes: scribed for Dr. Lady Gary By Zenaida Deed, RN. 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 There was a Double Layer Compression Therapy Procedure by Zenaida Deed, RN. Post procedure Diagnosis Wound #7: Same as Pre-Procedure Notes: urgo lite. Wound #8 Pre-procedure diagnosis of Wound #8 is a Lymphedema located on the Left,Anterior Lower Leg .Severity of Tissue Pre Debridement is: Fat layer exposed. There was a Selective/Open Wound Non-Viable Tissue Debridement with a total area of 2.2 sq cm performed by Duanne Guess, MD. With the following instrument(s): Curette to remove Non-Viable tissue/material. Material removed includes [facility] after achieving pain control using Lidocaine 4% Topical Solution. No specimens were taken. A time out was conducted at 08:55, prior to the start of the procedure. A Minimum amount of bleeding was controlled with Pressure. The procedure was tolerated well with a pain level of 1 throughout and a pain level of 1 following the procedure. Post Debridement Measurements: 1cm length x 2.8cm width x 0.1cm depth; 0.22cm^3 volume. Character of Wound/Ulcer Post Debridement is improved. Severity of Tissue Post Debridement is: Fat layer exposed. Post procedure Diagnosis Wound #8: Same as Pre-Procedure General Notes: scribed for Dr. Lady Gary By Zenaida Deed, RN. Wound #9 Pre-procedure diagnosis of Wound #9 is a Pressure Ulcer located on the Right,Medial T Second . There was a Selective/Open Wound Non-Viable Tissue oe Debridement with a total area of 0.03 sq cm performed by Duanne Guess, MD. With the following instrument(s): Curette to remove Non-Viable tissue/material. Material  removed includes Callus and Slough and after achieving pain control using Lidocaine 4% Topical Solution. No specimens were taken. A time out was conducted at 08:55, prior to the start of the procedure. A Minimum amount of bleeding was controlled with Pressure. The procedure was tolerated well with a pain level of 1 throughout and a pain level of 1 following the procedure. Post Debridement Measurements: 0.2cm length x 0.2cm width x 0.1cm depth; 0.003cm^3 volume. Post debridement Stage noted as Category/Stage III. Character of Wound/Ulcer Post Debridement is improved. Post procedure Diagnosis Wound #9: Same as Pre-Procedure General Notes: scribed for Dr. Lady Gary By Zenaida Deed, RN. There was a Double Layer Compression Therapy Procedure by Zenaida Deed, RN. Post procedure Diagnosis Wound #: Same as Pre-Procedure Notes: urgo lite. Plan Follow-up Appointments: Return Appointment in 1 week. - Dr. Lady Gary - Room 3 Anesthetic: Wound #7 Left,Lateral Lower Leg: (In clinic) Topical Lidocaine 4% applied to wound bed Wound #8 Left,Anterior Lower Leg: (In clinic) Topical Lidocaine 4% applied to wound bed Wound #9 Right,Medial T Second: oe (In clinic) Topical Lidocaine 4% applied to  wound bed Bathing/ Shower/ Hygiene: May shower with protection but do not get wound dressing(s) wet. Protect dressing(s) with water repellant cover (for example, large plastic bag) or a cast cover and may then take shower. Edema Control - Lymphedema / SCD / Other: Elevate legs to the level of the heart or above for 30 minutes daily and/or when sitting for 3-4 times a day throughout the day. Avoid standing for long periods of time. Exercise regularly Non Wound Condition: Apply the following to affected area as directed: - silver alginate to blood blister and use urgo lite wrap to right lower leg The following medication(s) was prescribed: lidocaine HCl topical 4 % cream cream topical for prior to debriedement was  prescribed at facility WOUND #7: - Lower Leg Wound Laterality: Left, Lateral Peri-Wound Care: Sween Lotion (Moisturizing lotion) 1 x Per Week/ Discharge Instructions: Apply moisturizing lotion as directed ALETA, MANTERNACH ([id number]) [id number] Page 9 of 11 Prim Dressing: Maxorb Extra Ag+ Alginate Dressing, 4x4.75 (in/in) 1 x Per Week/ ary Discharge Instructions: Apply to wound bed as instructed Secondary Dressing: ABD Pad, 8x10 1 x Per Week/ Discharge Instructions: Apply over primary dressing as directed. Com pression Wrap: Urgo K2 Lite, two layer compression system, regular 1 x Per Week/ Discharge Instructions: Apply Urgo K2 Lite as directed (alternative to 3 layer compression). WOUND #8: - Lower Leg Wound Laterality: Left, Anterior Peri-Wound Care: Sween Lotion (Moisturizing lotion) 1 x Per Week/ Discharge Instructions: Apply moisturizing lotion as directed Prim Dressing: Maxorb Extra Ag+ Alginate Dressing, 4x4.75 (in/in) 1 x Per Week/ ary Discharge Instructions: Apply to wound bed as instructed Secondary Dressing: ABD Pad, 8x10 1 x Per Week/ Discharge Instructions: Apply over primary dressing as directed. Com pression Wrap: Urgo K2 Lite, two layer compression system, regular 1 x Per Week/ Discharge Instructions: Apply Urgo K2 Lite as directed (alternative to 3 layer compression). WOUND #9: - T Second Wound Laterality: Right, Medial oe Prim Dressing: Maxorb Extra Ag+ Alginate Dressing, 2x2 (in/in) Every Other Day/30 Days ary Discharge Instructions: Apply to wound bed as instructed Secondary Dressing: Woven Gauze Sponges 2x2 in Every Other Day/30 Days Discharge Instructions: Apply over primary dressing as directed. Secured With: Insurance underwriter, Sterile 2x75 (in/in) Every Other Day/30 Days Discharge Instructions: Secure with stretch gauze as directed. 05/22/2022: She returns today with new ulcers on her left leg. A large hematoma/blood  blister on her upper lateral leg, just distal to the knee opened up shortly after she was healed out from our clinic. There is slough on the wound surface, but no concern for infection. She has a second ulcer on her anterior tibial surface with slough on the surface. The small site on her right second toe that we have been monitoring has opened. There is some slough in the center with surrounding callus. She has a hematoma/blood blister on the medial aspect of her right lower leg that is not open, but it is certainly threatening to do so. I used a curette to debride slough from both of the left leg ulcers and slough and callus from her second toe. We will apply silver alginate to all of these sites. We are also going to apply silver alginate over the area on her right lower leg that threatens to open. Bilateral 3 layer compression. Follow-up in 1 week. Electronic Signature(s) Signed: 05/22/2022 5:51:03 PM By: Zenaida Deed RN, BSN Signed: 05/23/2022 7:50:39 AM By: Duanne Guess MD FACS Previous Signature: 05/22/2022 9:54:20 AM Version By: Duanne Guess MD FACS Entered By:  Zenaida Deed on 05/22/2022 16:59:20 -------------------------------------------------------------------------------- HxROS Details Patient Name: Date of Service: [name], [place] RO L 05/22/2022 8:15 A M Medical Record Number: [id number] Patient Account Number: [id number] Date of Birth/Sex: Treating RN: 01-30-1931 (89 y.o. F) Primary Care Provider: Romeo Rabon Other Clinician: Referring Provider: Treating Provider/Extender: [name] in Treatment: 9 Information Obtained From Patient Eyes Medical History: Positive for: Cataracts Hematologic/Lymphatic Medical History: Positive for: Lymphedema Respiratory Medical History: Positive for: Sleep Apnea Cardiovascular Medical History: Positive for: Coronary Artery Disease; Hypertension Gastrointestinal Medical History: Positive for:  Hepatitis B - 50+ yrs ago Past Medical History NotesNYISHA, CLIPPARD ([id number]) [id number] Page 10 of 11 diverticulosis, GERD Musculoskeletal Medical History: Positive for: Osteoarthritis Past Medical History Notes: osteoporosis HBO Extended History Items Eyes: Cataracts Immunizations Pneumococcal Vaccine: Received Pneumococcal Vaccination: Yes Received Pneumococcal Vaccination On or After 60th Birthday: Yes Implantable Devices No devices added Hospitalization / Surgery History Type of Hospitalization/Surgery abdominal hysterectomy cardiac catheterization cataract extraction bilat. colonoscopy bilateral total knee replacement upper GI endoscopy Family and Social History Cancer: No; Diabetes: No; Heart Disease: Yes - Father; Hereditary Spherocytosis: No; Hypertension: Yes - Mother; Kidney Disease: No; Lung Disease: No; Seizures: No; Stroke: No; Thyroid Problems: No; Tuberculosis: No; Never smoker; Marital Status - Married; Alcohol Use: Rarely; Drug Use: No History; Caffeine Use: Moderate; Financial Concerns: No; Food, Clothing or Shelter Needs: No; Support System Lacking: No; Transportation Concerns: No Electronic Signature(s) Signed: 05/22/2022 10:07:31 AM By: Duanne Guess MD FACS Entered By: Duanne Guess on 05/22/2022 09:52:37 -------------------------------------------------------------------------------- SuperBill Details Patient Name: Date of Service: [name], [name] 05/22/2022 Medical Record Number: [id number] Patient Account Number: [id number] Date of Birth/Sex: Treating RN: 26-Apr-1930 (89 y.o. F) Primary Care Provider: Romeo Rabon Other Clinician: Referring Provider: Treating Provider/Extender: [name] in Treatment: 9 Diagnosis Coding ICD-10 Codes Code Description [phone] Non-pressure chronic ulcer of other part of left lower leg with fat layer exposed L97.512 Non-pressure chronic ulcer of  other part of right foot with fat layer exposed R60.0 Localized edema I87.2 Venous insufficiency (chronic) (peripheral) Z79.52 Long term (current) use of systemic steroids Facility Procedures : CPT4 Code: [id number] Description: [id number] - DEBRIDE WOUND 1ST 20 SQ CM OR < ICD-10 Diagnosis Description L97.822 Non-pressure chronic ulcer of other part of left lower leg with fat layer exposed L97.512 Non-pressure chronic ulcer of other part of right foot with fat layer  exposed Modifier: Quantity: 1 : Trevathan, CPT4 Code: [id number] Kaitlynn ([id number] Description: (Facility Use Only) [phone] - APPLY MULTLAY COMPRS LWR RT LEG ) [id number] Modifier: 59 [id number] Quantity: 1 7.pdf Page 11 of 11 Physician Procedures : CPT4 Code Description Modifier [phone] [id number] - WC PHYS LEVEL 4 - EST PT 25 ICD-10 Diagnosis Description L97.822 Non-pressure chronic ulcer of other part of left lower leg with fat layer exposed L97.512 Non-pressure chronic ulcer of other part of right  foot with fat layer exposed I87.2 Venous insufficiency (chronic) (peripheral) Z79.52 Long term (current) use of systemic steroids Quantity: 1 : [id number] - WC PHYS DEBR WO ANESTH 20 SQ CM ICD-10 Diagnosis Description L97.822 Non-pressure chronic ulcer of other part of left lower leg with fat layer exposed L97.512 Non-pressure chronic ulcer of other part of right foot with fat layer exposed Quantity: 1 Electronic Signature(s) Signed: 05/22/2022 5:51:03 PM By: Zenaida Deed RN, BSN Signed: 05/23/2022 7:50:39 AM By: Duanne Guess MD FACS Previous Signature: 05/22/2022 9:54:45 AM Version By: Duanne Guess MD FACS Entered By: Zenaida Deed on 05/22/2022 16:59:04

## 2022-05-29 ENCOUNTER — Encounter (HOSPITAL_BASED_OUTPATIENT_CLINIC_OR_DEPARTMENT_OTHER): Payer: Medicare Other | Admitting: General Surgery

## 2022-05-29 DIAGNOSIS — L97512 Non-pressure chronic ulcer of other part of right foot with fat layer exposed: Secondary | ICD-10-CM | POA: Diagnosis not present

## 2022-05-30 NOTE — Progress Notes (Signed)
Alexis, [name] ([id number]) [id number] Page 1 of 1 Visit Report for 05/29/2022 Fall Risk Assessment Details Patient Name: Date of Service: [name], [place] RO L 05/29/2022 8:15 A M Medical Record Number: [id number] Patient Account Number: [id number] Date of Birth/Sex: Treating RN: 07-23-1930 (89 y.o. [name] Primary Care Iridiana Fonner: Romeo Rabon Other Clinician: Referring Chance Munter: Treating Maudell Stanbrough/Extender: [name] in Treatment: 10 Fall Risk Assessment Items Have you had 2 or more falls in the last 12 monthso 0 Yes Have you had any fall that resulted in injury in the last 12 monthso 0 No FALLS RISK SCREEN History of falling - immediate or within 3 months 25 Yes Secondary diagnosis (Do you have 2 or more medical diagnoseso) 0 No Ambulatory aid None/bed rest/wheelchair/nurse 0 No Crutches/cane/walker 15 Yes Furniture 0 No Intravenous therapy Access/Saline/Heparin Lock 0 No Gait/Transferring Normal/ bed rest/ wheelchair 0 No Weak (short steps with or without shuffle, stooped but able to lift head while walking, may seek 10 Yes support from furniture) Impaired (short steps with shuffle, may have difficulty arising from chair, head down, impaired 0 No balance) Mental Status Oriented to own ability 0 Yes Electronic Signature(s) Signed: 05/29/2022 4:53:54 PM By: Zenaida Deed RN, BSN Entered By: Zenaida Deed on 05/29/2022 08:57:11

## 2022-05-30 NOTE — Progress Notes (Signed)
Linda, Okey [name] ([id number]) [id number] Page 1 of 14 Visit Report for 05/29/2022 Arrival Information Details Patient Name: Date of Service: [name], [place] RO L 05/29/2022 8:15 A M Medical Record Number: [id number] Patient Account Number: [id number] Date of Birth/Sex: Treating RN: 07-13-1930 (89 y.o. [name], Linda Primary Care Leeah Politano: Romeo Rabon Other Clinician: Referring Myrian Botello: Treating Joeanne Robicheaux/Extender: [name] in Treatment: 10 Visit Information History Since Last Visit Added or deleted any medications: No Patient Arrived: [name] Any new allergies or adverse reactions: No Arrival Time: 08:29 Had a fall or experienced change in Yes Accompanied By: spouse activities of daily living that may affect Transfer Assistance: None risk of falls: Patient Identification Verified: Yes Signs or symptoms of abuse/neglect since last visito No Secondary Verification Process Completed: Yes Hospitalized since last visit: No Patient Requires Transmission-Based Precautions: No Implantable device outside of the clinic excluding No Patient Has Alerts: No cellular tissue based products placed in the center since last visit: Has Dressing in Place as Prescribed: Yes Has Compression in Place as Prescribed: Yes Pain Present Now: No Electronic Signature(s) Signed: 05/29/2022 4:53:54 PM By: Zenaida Deed RN, BSN Entered By: Zenaida Deed on 05/29/2022 08:30:13 -------------------------------------------------------------------------------- Compression Therapy Details Patient Name: Date of Service: [name], [name] 05/29/2022 8:15 A M Medical Record Number: [id number] Patient Account Number: [id number] Date of Birth/Sex: Treating RN: 08/29/1930 (89 y.o. [name] Primary Care Bernard Donahoo: Romeo Rabon Other Clinician: Referring Rupert Azzara: Treating Kathlyne Loud/Extender: [name] in Treatment:  10 Compression Therapy Performed for Wound Assessment: Wound #10 Right,Medial Lower Leg Performed By: Clinician Zenaida Deed, RN Compression Type: Double Layer Post Procedure Diagnosis Same as Pre-procedure Notes Stephannie Li Electronic Signature(s) Signed: 05/29/2022 4:53:54 PM By: Zenaida Deed RN, BSN Entered By: Zenaida Deed on 05/29/2022 09:11:54 -------------------------------------------------------------------------------- Compression Therapy Details Patient Name: Date of Service: [name], [name] 05/29/2022 8:15 A M Medical Record Number: [id number] Patient Account Number: [id number] Date of Birth/Sex: Treating RN: 1930/08/02 (89 y.o. [name] Primary Care Senica Crall: Romeo Rabon Other Clinician: Referring Lesia Monica: Treating Juelz Claar/Extender: [name] in Treatment: 10 Compression Therapy Performed for Wound Assessment: Wound #7 Left,Lateral Lower Leg Performed By: Clinician Zenaida Deed, RN Compression TypeLoreli Slot, Okey [name] ([id number]) [id number] Page 2 of 14 Post Procedure Diagnosis Same as Pre-procedure Notes urgo Production assistant, radio) Signed: 05/29/2022 4:53:54 PM By: Zenaida Deed RN, BSN Entered By: Zenaida Deed on 05/29/2022 09:11:54 -------------------------------------------------------------------------------- Encounter Discharge Information Details Patient Name: Date of Service: [name], [name] 05/29/2022 8:15 A M Medical Record Number: [id number] Patient Account Number: [id number] Date of Birth/Sex: Treating RN: 1930/03/05 (89 y.o. [name] Primary Care Minami Arriaga: Romeo Rabon Other Clinician: Referring Chloe Miyoshi: Treating [name]/Extender: [name] in Treatment: 10 Encounter Discharge Information Items Post Procedure Vitals Discharge Condition: Stable Temperature (F): 97.8 Ambulatory Status: Walker Pulse  (bpm): 68 Discharge Destination: Home Respiratory Rate (breaths/min): 20 Transportation: Private Auto Blood Pressure (mmHg): 125/73 Accompanied By: spouse Schedule Follow-up Appointment: Yes Clinical Summary of Care: Patient Declined Electronic Signature(s) Signed: 05/29/2022 4:53:54 PM By: Zenaida Deed RN, BSN Entered By: Zenaida Deed on 05/29/2022 13:31:17 -------------------------------------------------------------------------------- Lower Extremity Assessment Details Patient Name: Date of Service: [name], [name] 05/29/2022 8:15 A M Medical Record Number: [id number] Patient Account Number: [id number] Date of Birth/Sex: Treating RN: 06-07-1930 (89 y.o. [name] Primary Care [name]: Romeo Rabon Other Clinician: Referring [name]: Treating [name]/Extender: [name] in Treatment: 10 Edema  Assessment Assessed: [Left: No] [Right: No] Edema: [Left: Yes] [Right: Yes] Calf Left: Right: Point of Measurement: From Medial Instep 31.8 cm 30.3 cm Ankle Left: Right: Point of Measurement: From Medial Instep 19 cm 20 cm Vascular Assessment Pulses: Dorsalis Pedis Palpable: [Left:Yes] [Right:Yes] Electronic Signature(s) Signed: 05/29/2022 4:53:54 PM By: Zenaida Deed RN, BSN Entered By: Zenaida Deed on 05/29/2022 08:40:30 [place], Okey [name] ([id number]) [id number] Page 3 of 14 -------------------------------------------------------------------------------- Multi Wound Chart Details Patient Name: Date of Service: [name], [place] RO L 05/29/2022 8:15 A M Medical Record Number: [id number] Patient Account Number: [id number] Date of Birth/Sex: Treating RN: 1930/04/11 (89 y.o. F) Primary Care Lexani Corona: Romeo Rabon Other Clinician: Referring Marbella Markgraf: Treating Court Gracia/Extender: [name] in Treatment: 10 Vital Signs Height(in): 68 Pulse(bpm): 68 Weight(lbs): 180 Blood  Pressure(mmHg): 125/73 Body Mass Index(BMI): 27.4 Temperature(F): 97.8 Respiratory Rate(breaths/min): 18 [10:Photos:] [11:No Photos] Right, Medial Lower Leg Left, Lateral Foot Left, Lateral Lower Leg Wound Location: Gradually Appeared Gradually Appeared Blister Wounding Event: Venous Leg Ulcer Venous Leg Ulcer Venous Leg Ulcer Primary Etiology: N/A N/A Lymphedema Secondary Etiology: Cataracts, Lymphedema, Sleep Cataracts, Lymphedema, Sleep Cataracts, Lymphedema, Sleep Comorbid History: Apnea, Coronary Artery Disease, Apnea, Coronary Artery Disease, Apnea, Coronary Artery Disease, Hypertension, Hepatitis B, Hypertension, Hepatitis B, Hypertension, Hepatitis B, Osteoarthritis Osteoarthritis Osteoarthritis 05/29/2022 05/29/2022 05/15/2022 Date Acquired: 0 0 1 Weeks of Treatment: Open Open Open Wound Status: No No No Wound Recurrence: 1.2x1x0.1 0.5x0.9x0.1 4.5x3.2x0.1 Measurements L x W x D (cm) 0.942 0.353 11.31 A (cm) : rea 0.094 0.035 1.131 Volume (cm) : N/A N/A 33.30% % Reduction in A rea: N/A N/A 33.30% % Reduction in Volume: No No No Undermining: Full Thickness Without Exposed Full Thickness Without Exposed Full Thickness Without Exposed Classification: Support Structures Support Structures Support Structures Medium None Present Medium Exudate A mount: Serosanguineous N/A Purulent Exudate Type: red, brown N/A yellow, brown, green Exudate Color: Flat and Intact Flat and Intact Flat and Intact Wound Margin: None Present (0%) None Present (0%) Medium (34-66%) Granulation A mount: N/A N/A Red Granulation Quality: Large (67-100%) Large (67-100%) Medium (34-66%) Necrotic A mount: Eschar, Adherent Slough Eschar Eschar, Adherent Slough Necrotic Tissue: Fat Layer (Subcutaneous Tissue): Yes Fat Layer (Subcutaneous Tissue): Yes Fat Layer (Subcutaneous Tissue): Yes Exposed Structures: Fascia: No Fascia: No Fascia: No Tendon: No Tendon: No Tendon: No Muscle:  No Muscle: No Muscle: No Joint: No Joint: No Joint: No Bone: No Bone: No Bone: No Small (1-33%) Small (1-33%) Small (1-33%) Epithelialization: Debridement - Excisional Debridement - Selective/Open Wound Debridement - Selective/Open Wound Debridement: Pre-procedure Verification/Time Out 09:00 09:00 09:00 Taken: Lidocaine 4% Topical Solution Lidocaine 4% Topical Solution Lidocaine 4% Topical Solution Pain Control: Necrotic/Eschar, Subcutaneous, Necrotic/Eschar, Ambulance person, Slough Tissue Debrided: Slough Skin/Subcutaneous Tissue Non-Viable Tissue Non-Viable Tissue Level: 0.94 0.35 11.3 Debridement A (sq cm): rea Curette Curette Curette Instrument: Minimum Minimum Minimum Bleeding: Pressure Pressure Pressure Hemostasis Achieved: 0 0 0 Procedural Pain: 0 0 0 Post Procedural Pain: Debridement Treatment Response: Procedure was tolerated well Procedure was tolerated well Procedure was tolerated well Post Debridement Measurements L x 1.2x1x0.1 0.5x0.9x0.1 4.5x3.2x0.1 W x D (cm) [place]on, Cobie ([id number]) [id number] Page 4 of 14 0.094 0.035 1.131 Post Debridement Volume: (cm) N/A N/A N/A Post Debridement Stage: No Abnormalities Noted No Abnormalities Noted No Abnormalities Noted Periwound Skin Texture: No Abnormalities Noted No Abnormalities Noted No Abnormalities Noted Periwound Skin Moisture: No Abnormalities Noted No Abnormalities Noted Ecchymosis: No Periwound Skin Color: N/A No Abnormality No Abnormality Temperature: N/A N/A Yes Tenderness on Palpation: Compression  Therapy Debridement Compression Therapy Procedures Performed: Debridement Debridement Wound Number: 8 9 N/A Photos: N/A Left, Anterior Lower Leg Right, Medial T Second oe N/A Wound Location: Blister Gradually Appeared N/A Wounding Event: Lymphedema Pressure Ulcer N/A Primary Etiology: Venous Leg Ulcer N/A N/A Secondary Etiology: Cataracts, Lymphedema,  Sleep Cataracts, Lymphedema, Sleep N/A Comorbid History: Apnea, Coronary Artery Disease, Apnea, Coronary Artery Disease, Hypertension, Hepatitis B, Hypertension, Hepatitis B, Osteoarthritis Osteoarthritis 05/15/2022 04/30/2020 N/A Date Acquired: 1 1 N/A Weeks of Treatment: Open Open N/A Wound Status: No No N/A Wound Recurrence: 0.9x2.5x0.1 0.2x0.2x0.2 N/A Measurements L x W x D (cm) 1.767 0.031 N/A A (cm) : rea 0.177 0.006 N/A Volume (cm) : 19.60% 0.00% N/A % Reduction in A rea: 19.50% -100.00% N/A % Reduction in Volume: 12 Starting Position 1 (o'clock): 12 Ending Position 1 (o'clock): 0.3 Maximum Distance 1 (cm): No Yes N/A Undermining: Full Thickness Without Exposed Category/Stage III N/A Classification: Support Structures Medium Small N/A Exudate A mount: Serosanguineous Purulent N/A Exudate Type: red, brown yellow, brown, green N/A Exudate Color: Flat and Intact Distinct, outline attached N/A Wound Margin: Large (67-100%) Large (67-100%) N/A Granulation A mount: Red Pink N/A Granulation Quality: None Present (0%) None Present (0%) N/A Necrotic A mount: N/A N/A N/A Necrotic Tissue: Fat Layer (Subcutaneous Tissue): Yes Fat Layer (Subcutaneous Tissue): Yes N/A Exposed Structures: Fascia: No Fascia: No Tendon: No Tendon: No Muscle: No Muscle: No Joint: No Joint: No Bone: No Bone: No Small (1-33%) Small (1-33%) N/A Epithelialization: Debridement - Selective/Open Wound Debridement - Selective/Open Wound N/A Debridement: Pre-procedure Verification/Time Out 09:00 09:00 N/A Taken: Lidocaine 4% Topical Solution Lidocaine 4% Topical Solution N/A Pain Control: [employer] N/A Tissue Debrided: Non-Viable Tissue Skin/Epidermis N/A Level: 1.77 0.2 N/A Debridement A (sq cm): rea Curette Curette N/A Instrument: Minimum Minimum N/A Bleeding: Pressure Pressure N/A Hemostasis A chieved: 0 0 N/A Procedural Pain: 0 0 N/A Post Procedural  Pain: Procedure was tolerated well Procedure was tolerated well N/A Debridement Treatment Response: 0.9x2.5x0.1 0.5x0.5x0.1 N/A Post Debridement Measurements L x W x D (cm) 0.177 0.02 N/A Post Debridement Volume: (cm) N/A Category/Stage III N/A Post Debridement Stage: No Abnormalities Noted Callus: Yes N/A Periwound Skin Texture: No Abnormalities Noted No Abnormalities Noted N/A Periwound Skin Moisture: Hemosiderin Staining: Yes No Abnormalities Noted N/A Periwound Skin Color: No Abnormality N/A N/A Temperature: Yes N/A N/A Tenderness on Palpation: Debridement Debridement N/A Procedures Performed: Treatment Notes PRESSLEY, BARSKY ([id number]) [id number] Page 5 of 14 Electronic Signature(s) Signed: 05/29/2022 9:34:33 AM By: Duanne Guess MD FACS Entered By: Duanne Guess on 05/29/2022 09:34:33 -------------------------------------------------------------------------------- Multi-Disciplinary Care Plan Details Patient Name: Date of Service: [name], [name] 05/29/2022 8:15 A M Medical Record Number: [id number] Patient Account Number: [id number] Date of Birth/Sex: Treating RN: 12-09-1930 (89 y.o. [name] Primary Care Georges Victorio: Romeo Rabon Other Clinician: Referring Jeny Nield: Treating Myonna Chisom/Extender: [name] in Treatment: 10 Multidisciplinary Care Plan reviewed with physician Active Inactive Abuse / Safety / Falls / Self Care Management Nursing Diagnoses: History of Falls Impaired physical mobility Potential for falls Goals: Patient will not experience any injury related to falls Date Initiated: 03/20/2022 Date Inactivated: 05/29/2022 Target Resolution Date: 05/05/2022 Goal Status: Met Patient/caregiver will demonstrate safe use of adaptive devices to increase mobility Date Initiated: 03/20/2022 Date Inactivated: 04/27/2022 Target Resolution Date: 05/05/2022 Goal Status: Met Patient/caregiver  will verbalize/demonstrate measures taken to prevent injury and/or falls Date Initiated: 05/29/2022 Target Resolution Date: 06/26/2022 Goal Status: Active Interventions: Assess: immobility, friction, shearing, incontinence upon admission and  as needed Provide education on fall prevention Notes: Venous Leg Ulcer Nursing Diagnoses: Knowledge deficit related to disease process and management Potential for venous Insuffiency (use before diagnosis confirmed) Goals: Patient will maintain optimal edema control Date Initiated: 05/22/2022 Target Resolution Date: 06/19/2022 Goal Status: Active Interventions: Assess peripheral edema status every visit. Compression as ordered Provide education on venous insufficiency Treatment Activities: Therapeutic compression applied : 05/22/2022 Notes: Wound/Skin Impairment Nursing Diagnoses: Impaired tissue integrity Knowledge deficit related to ulceration/compromised skin integrity Goals: Patient/caregiver will verbalize understanding of skin care regimen [place], Okey [name] ([id number]) [id number] Page 6 of 14 Date Initiated: 03/20/2022 Target Resolution Date: 06/19/2022 Goal Status: Active Interventions: Assess ulceration(s) every visit Treatment Activities: Skin care regimen initiated : 03/20/2022 Topical wound management initiated : 03/20/2022 Notes: Electronic Signature(s) Signed: 05/29/2022 4:53:54 PM By: Zenaida Deed RN, BSN Entered By: Zenaida Deed on 05/29/2022 08:58:03 -------------------------------------------------------------------------------- Pain Assessment Details Patient Name: Date of Service: [name], [name] 05/29/2022 8:15 A M Medical Record Number: [id number] Patient Account Number: [id number] Date of Birth/Sex: Treating RN: 30-Jul-1930 (89 y.o. [name] Primary Care Charise Leinbach: Romeo Rabon Other Clinician: Referring Shirleymae Hauth: Treating Henli Hey/Extender: [name] in Treatment: 10 Active Problems Location of Pain Severity and Description of Pain Patient Has Paino No Site Locations Rate the pain. Current Pain Level: 0 Pain Management and Medication Current Pain Management: Electronic Signature(s) Signed: 05/29/2022 4:53:54 PM By: Zenaida Deed RN, BSN Entered By: Zenaida Deed on 05/29/2022 08:30:24 -------------------------------------------------------------------------------- Patient/Caregiver Education Details Patient Name: Date of Service: [name], [name] 4/29/2024andnbsp8:15 A M Medical Record Number: [id number] Patient Account Number: [id number] Date of Birth/Gender: Treating RN: 10-Apr-1930 (89 y.o. [name] Primary Care Physician: Romeo Rabon Other Clinician: Referring Physician: Treating Physician/Extender: [name] in Treatment: [address] [place], [place] ([id number]) [id number] Page 7 of 14 Education Provided To: Patient Education Topics Provided Venous: Methods: Explain/Verbal Responses: Reinforcements needed, State content correctly Wound/Skin Impairment: Methods: Explain/Verbal Responses: Reinforcements needed, State content correctly Electronic Signature(s) Signed: 05/29/2022 4:53:54 PM By: Zenaida Deed RN, BSN Entered By: Zenaida Deed on 05/29/2022 08:58:23 -------------------------------------------------------------------------------- Wound Assessment Details Patient Name: Date of Service: [name], [name] 05/29/2022 8:15 A M Medical Record Number: [id number] Patient Account Number: [id number] Date of Birth/Sex: Treating RN: 1930/09/03 (89 y.o. [name] Primary Care Javier Mamone: Romeo Rabon Other Clinician: Referring Shailene Demonbreun: Treating Lisett Dirusso/Extender: [name] in Treatment: 10 Wound Status Wound Number: 10 Primary Venous Leg Ulcer Etiology: Wound Location: Right,  Medial Lower Leg Wound Open Wounding Event: Gradually Appeared Status: Date Acquired: 05/29/2022 Comorbid Cataracts, Lymphedema, Sleep Apnea, Coronary Artery Disease, Weeks Of Treatment: 0 History: Hypertension, Hepatitis B, Osteoarthritis Clustered Wound: No Photos Wound Measurements Length: (cm) 1.2 Width: (cm) 1 Depth: (cm) 0.1 Area: (cm) 0.942 Volume: (cm) 0.094 % Reduction in Area: % Reduction in Volume: Epithelialization: Small (1-33%) Tunneling: No Undermining: No Wound Description Classification: Full Thickness Without Exposed Support Wound Margin: Flat and Intact Exudate Amount: Medium Exudate Type: Serosanguineous Exudate Color: red, brown Structures Foul Odor After Cleansing: No Slough/Fibrino Yes Wound Bed Granulation Amount: None Present (0%) Exposed Structure Necrotic Amount: Large (67-100%) Fascia Exposed: No Necrotic Quality: Eschar, Adherent Slough Fat Layer (Subcutaneous Tissue) Exposed: Yes Tendon Exposed: No Muscle Exposed: No Joint Exposed: No [place], Ertha ([id number]) [id number] Page 8 of 14 Bone Exposed: No Periwound Skin Texture Texture Color No Abnormalities Noted: Yes No Abnormalities Noted: Yes Moisture No Abnormalities Noted: Yes Treatment Notes Wound #10 (Lower Leg)  Wound Laterality: Right, Medial Cleanser Peri-Wound Care Sween Lotion (Moisturizing lotion) Discharge Instruction: Apply moisturizing lotion as directed Topical Primary Dressing Maxorb Extra Ag+ Alginate Dressing, 4x4.75 (in/in) Discharge Instruction: Apply to wound bed as instructed Secondary Dressing Woven Gauze Sponge, Non-Sterile 4x4 in Discharge Instruction: Apply over primary dressing as directed. Secured With Compression Wrap Urgo K2 Lite, two layer compression system, regular Discharge Instruction: Apply Urgo K2 Lite as directed (alternative to 3 layer compression). Compression Stockings Add-Ons Electronic  Signature(s) Signed: 05/29/2022 4:53:54 PM By: Zenaida Deed RN, BSN Entered By: Zenaida Deed on 05/29/2022 08:56:35 -------------------------------------------------------------------------------- Wound Assessment Details Patient Name: Date of Service: [name], [name] 05/29/2022 8:15 A M Medical Record Number: [id number] Patient Account Number: [id number] Date of Birth/Sex: Treating RN: 03-17-1930 (89 y.o. [name] Primary Care Lexie Koehl: Romeo Rabon Other Clinician: Referring Early Steel: Treating Keyshawna Prouse/Extender: [name] in Treatment: 10 Wound Status Wound Number: 11 Primary Venous Leg Ulcer Etiology: Wound Location: Left, Lateral Foot Wound Open Wounding Event: Gradually Appeared Status: Date Acquired: 05/29/2022 Comorbid Cataracts, Lymphedema, Sleep Apnea, Coronary Artery Disease, Weeks Of Treatment: 0 History: Hypertension, Hepatitis B, Osteoarthritis Clustered Wound: No Wound Measurements Length: (cm) 0.5 Width: (cm) 0.9 Depth: (cm) 0.1 Area: (cm) 0.353 Volume: (cm) 0.035 % Reduction in Area: % Reduction in Volume: Epithelialization: Small (1-33%) Tunneling: No Undermining: No Wound Description Classification: Full Thickness Without Exposed Support Structures Wound Margin: Flat and Intact Exudate Amount: None Present Metayer, Dmiyah ([id number]) Wound Bed Granulation Amount: None Present (0%) Necrotic Amount: Large (67-100%) Necrotic Quality: Eschar Foul Odor After Cleansing: No Slough/Fibrino No [id number] Page 9 of 14 Exposed Structure Fascia Exposed: No Fat Layer (Subcutaneous Tissue) Exposed: Yes Tendon Exposed: No Muscle Exposed: No Joint Exposed: No Bone Exposed: No Periwound Skin Texture Texture Color No Abnormalities Noted: Yes No Abnormalities Noted: Yes Moisture Temperature / Pain No Abnormalities Noted: Yes Temperature: No Abnormality Treatment Notes Wound #11  (Foot) Wound Laterality: Left, Lateral Cleanser Peri-Wound Care Sween Lotion (Moisturizing lotion) Discharge Instruction: Apply moisturizing lotion as directed Topical Primary Dressing Maxorb Extra Ag+ Alginate Dressing, 4x4.75 (in/in) Discharge Instruction: Apply to wound bed as instructed Secondary Dressing Optifoam Non-Adhesive Dressing, 4x4 in Discharge Instruction: Apply over primary dressing cut to make foam donut Woven Gauze Sponge, Non-Sterile 4x4 in Discharge Instruction: Apply over primary dressing as directed. Secured With Compression Wrap Urgo K2 Lite, two layer compression system, regular Discharge Instruction: Apply Urgo K2 Lite as directed (alternative to 3 layer compression). Compression Stockings Add-Ons Electronic Signature(s) Signed: 05/29/2022 4:53:54 PM By: Zenaida Deed RN, BSN Entered By: Zenaida Deed on 05/29/2022 09:10:02 -------------------------------------------------------------------------------- Wound Assessment Details Patient Name: Date of Service: [name], [name] 05/29/2022 8:15 A M Medical Record Number: [id number] Patient Account Number: [id number] Date of Birth/Sex: Treating RN: 1930/05/30 (89 y.o. [name] Primary Care Rabon Scholle: Romeo Rabon Other Clinician: Referring Lewis Keats: Treating Makael Stein/Extender: [name] in Treatment: 10 Wound Status Wound Number: 7 Primary Venous Leg Ulcer Etiology: Wound Location: Left, Lateral Lower Leg Secondary Lymphedema Wounding Event: Blister Etiology: Date Acquired: 05/15/2022 Wound Open Weeks Of Treatment: 1 Status: Clustered Wound: No Comorbid Cataracts, Lymphedema, Sleep Apnea, Coronary Artery History: Disease, Hypertension, Hepatitis B, Osteoarthritis Photos [place], Okey [name] ([id number]) [id number] Page 10 of 14 Wound Measurements Length: (cm) 4.5 Width: (cm) 3.2 Depth: (cm) 0.1 Area: (cm) 11.31 Volume: (cm)  1.131 % Reduction in Area: 33.3% % Reduction in Volume: 33.3% Epithelialization: Small (1-33%) Tunneling: No Undermining: No Wound Description Classification: Full Thickness Without  Exposed Support Structures Wound Margin: Flat and Intact Exudate Amount: Medium Exudate Type: Purulent Exudate Color: yellow, brown, green Foul Odor After Cleansing: No Slough/Fibrino Yes Wound Bed Granulation Amount: Medium (34-66%) Exposed Structure Granulation Quality: Red Fascia Exposed: No Necrotic Amount: Medium (34-66%) Fat Layer (Subcutaneous Tissue) Exposed: Yes Necrotic Quality: Eschar, Adherent Slough Tendon Exposed: No Muscle Exposed: No Joint Exposed: No Bone Exposed: No Periwound Skin Texture Texture Color No Abnormalities Noted: Yes No Abnormalities Noted: Yes Moisture Temperature / Pain No Abnormalities Noted: Yes Temperature: No Abnormality Tenderness on Palpation: Yes Treatment Notes Wound #7 (Lower Leg) Wound Laterality: Left, Lateral Cleanser Peri-Wound Care Sween Lotion (Moisturizing lotion) Discharge Instruction: Apply moisturizing lotion as directed Topical Primary Dressing Maxorb Extra Ag+ Alginate Dressing, 4x4.75 (in/in) Discharge Instruction: Apply to wound bed as instructed Secondary Dressing ABD Pad, 8x10 Discharge Instruction: Apply over primary dressing as directed. Secured With Compression Wrap Urgo K2 Lite, two layer compression system, regular Discharge Instruction: Apply Urgo K2 Lite as directed (alternative to 3 layer compression). Compression Stockings Add-Ons Electronic Signature(s) Signed: 05/29/2022 4:53:54 PM By: Zenaida Deed RN, BSN [place], [place] ([id number]) PM By: Zenaida Deed RN, BSN [id number] Page 11 of 14 Signed: 05/29/2022 4:53:54 Entered By: Zenaida Deed on 05/29/2022 08:53:22 -------------------------------------------------------------------------------- Wound Assessment Details Patient Name:  Date of Service: [name], [place] 05/29/2022 8:15 A M Medical Record Number: [id number] Patient Account Number: [id number] Date of Birth/Sex: Treating RN: 1931/01/04 (89 y.o. [name] Primary Care Emi Lymon: Romeo Rabon Other Clinician: Referring Shandy Vi: Treating Asha Grumbine/Extender: [name] in Treatment: 10 Wound Status Wound Number: 8 Primary Lymphedema Etiology: Wound Location: Left, Anterior Lower Leg Secondary Venous Leg Ulcer Wounding Event: Blister Etiology: Date Acquired: 05/15/2022 Wound Open Weeks Of Treatment: 1 Status: Clustered Wound: No Comorbid Cataracts, Lymphedema, Sleep Apnea, Coronary Artery History: Disease, Hypertension, Hepatitis B, Osteoarthritis Photos Wound Measurements Length: (cm) 0.9 Width: (cm) 2.5 Depth: (cm) 0.1 Area: (cm) 1.767 Volume: (cm) 0.177 % Reduction in Area: 19.6% % Reduction in Volume: 19.5% Epithelialization: Small (1-33%) Tunneling: No Undermining: No Wound Description Classification: Full Thickness Without Exposed Support Structures Wound Margin: Flat and Intact Exudate Amount: Medium Exudate Type: Serosanguineous Exudate Color: red, brown Foul Odor After Cleansing: No Slough/Fibrino Yes Wound Bed Granulation Amount: Large (67-100%) Exposed Structure Granulation Quality: Red Fascia Exposed: No Necrotic Amount: None Present (0%) Fat Layer (Subcutaneous Tissue) Exposed: Yes Tendon Exposed: No Muscle Exposed: No Joint Exposed: No Bone Exposed: No Periwound Skin Texture Texture Color No Abnormalities Noted: Yes No Abnormalities Noted: No Hemosiderin Staining: Yes Moisture No Abnormalities Noted: Yes Temperature / Pain Temperature: No Abnormality Tenderness on Palpation: Yes Treatment Notes Wound #8 (Lower Leg) Wound Laterality: Left, Anterior Dahlgren, Salena ([id number]) [id number] Page 12 of 14 Cleanser Peri-Wound Care Sween Lotion  (Moisturizing lotion) Discharge Instruction: Apply moisturizing lotion as directed Topical Primary Dressing Maxorb Extra Ag+ Alginate Dressing, 4x4.75 (in/in) Discharge Instruction: Apply to wound bed as instructed Secondary Dressing ABD Pad, 8x10 Discharge Instruction: Apply over primary dressing as directed. Secured With Compression Wrap Urgo K2 Lite, two layer compression system, regular Discharge Instruction: Apply Urgo K2 Lite as directed (alternative to 3 layer compression). Compression Stockings Add-Ons Electronic Signature(s) Signed: 05/29/2022 4:53:54 PM By: Zenaida Deed RN, BSN Entered By: Zenaida Deed on 05/29/2022 08:53:52 -------------------------------------------------------------------------------- Wound Assessment Details Patient Name: Date of Service: [name], [name] 05/29/2022 8:15 A M Medical Record Number: [id number] Patient Account Number: [id number] Date of Birth/Sex: Treating RN: 1930-09-28 (89 y.o. F) Zenaida Deed Primary  Care Orianna Biskup: Romeo Rabon Other Clinician: Referring Jadore Mcguffin: Treating Daysie Helf/Extender: [name] in Treatment: 10 Wound Status Wound Number: 9 Primary Pressure Ulcer Etiology: Wound Location: Right, Medial T Second oe Wound Open Wounding Event: Gradually Appeared Status: Date Acquired: 04/30/2020 Comorbid Cataracts, Lymphedema, Sleep Apnea, Coronary Artery Disease, Weeks Of Treatment: 1 History: Hypertension, Hepatitis B, Osteoarthritis Clustered Wound: No Photos Wound Measurements Length: (cm) 0.2 Width: (cm) 0.2 Depth: (cm) 0.2 Area: (cm) 0.031 Volume: (cm) 0.006 Milne, Havilah ([id number]) Wound Description Classification: Category/Stage III Wound Margin: Distinct, outline attached Exudate Amount: Small Exudate Type: Purulent Exudate Color: yellow, brown, green Foul Odor After Cleansing: No Slough/Fibrino No % Reduction in Area: 0% % Reduction in Volume:  -100% Epithelialization: Small (1-33%) Tunneling: No Undermining: Yes Starting Position (o'clock): 12 Ending Position (o'clock): 12 Maximum Distance: (cm) 0.3 [id number] Page 13 of 14 Wound Bed Granulation Amount: Large (67-100%) Exposed Structure Granulation Quality: Pink Fascia Exposed: No Necrotic Amount: None Present (0%) Fat Layer (Subcutaneous Tissue) Exposed: Yes Tendon Exposed: No Muscle Exposed: No Joint Exposed: No Bone Exposed: No Periwound Skin Texture Texture Color No Abnormalities Noted: No No Abnormalities Noted: Yes Callus: Yes Moisture No Abnormalities Noted: Yes Treatment Notes Wound #9 (Toe Second) Wound Laterality: Right, Medial Cleanser Peri-Wound Care Topical Primary Dressing Maxorb Extra Ag+ Alginate Dressing, 2x2 (in/in) Discharge Instruction: Apply to wound bed as instructed Secondary Dressing Woven Gauze Sponges 2x2 in Discharge Instruction: Apply over primary dressing as directed. Secured With Conforming Stretch Gauze Bandage, Sterile 2x75 (in/in) Discharge Instruction: Secure with stretch gauze as directed. Compression Wrap Compression Stockings Add-Ons Electronic Signature(s) Signed: 05/29/2022 4:53:54 PM By: Zenaida Deed RN, BSN Entered By: Zenaida Deed on 05/29/2022 08:54:35 -------------------------------------------------------------------------------- Vitals Details Patient Name: Date of Service: [name], [name] 05/29/2022 8:15 A M Medical Record Number: [id number] Patient Account Number: [id number] Date of Birth/Sex: Treating RN: November 26, 1930 (89 y.o. [name] Primary Care Makayle Krahn: Romeo Rabon Other Clinician: Referring Malayja Freund: Treating Parish Dubose/Extender: [name] in Treatment: 10 Vital Signs Time Taken: 08:31 Temperature (F): 97.8 Height (in): 68 Pulse (bpm): 68 Weight (lbs): 180 Respiratory Rate (breaths/min): 18 Body Mass Index (BMI):  27.4 Blood Pressure (mmHg): 125/73 Reference Range: 80 - 120 mg / dl Fenton, Aftan ([id number]) [id number].pdf Page 14 of 14 Electronic Signature(s) Signed: 05/29/2022 4:53:54 PM By: Zenaida Deed RN, BSN Entered By: Zenaida Deed on 05/29/2022 60:63:01

## 2022-05-30 NOTE — Progress Notes (Signed)
Metz, [name] ([id number]) [id number] Page 1 of 13 Visit Report for 05/29/2022 Chief Complaint Document Details Patient Name: Date of Service: [name], [place] RO L 05/29/2022 8:15 A M Medical Record Number: [id number] Patient Account Number: [id number] Date of Birth/Sex: Treating RN: 23-Feb-1930 (89 y.o. F) Primary Care Provider: Romeo Rabon Other Clinician: Referring Provider: Treating Provider/Extender: [name] in Treatment: 10 Information Obtained from: Patient Chief Complaint Patient presents for treatment of open ulcers due to venous insufficiency Electronic Signature(s) Signed: 05/29/2022 9:34:49 AM By: Duanne Guess MD FACS Entered By: Duanne Guess on 05/29/2022 09:34:49 -------------------------------------------------------------------------------- Debridement Details Patient Name: Date of Service: [name], [name] 05/29/2022 8:15 A M Medical Record Number: [id number] Patient Account Number: [id number] Date of Birth/Sex: Treating RN: 1930/06/17 (89 y.o. [name] Primary Care Provider: Romeo Rabon Other Clinician: Referring Provider: Treating Provider/Extender: [name] in Treatment: 10 Debridement Performed for Assessment: Wound #9 Right,Medial T Second oe Performed By: Physician Duanne Guess, MD Debridement Type: Debridement Level of Consciousness (Pre-procedure): Awake and Alert Pre-procedure Verification/Time Out Yes - 09:00 Taken: Start Time: 09:01 Pain Control: Lidocaine 4% T opical Solution Percent of Wound Bed Debrided: 100% T Area Debrided (cm): otal 0.2 Tissue and other material debrided: Non-Viable, Slough, Skin: Epidermis, Slough Level: Skin/Epidermis Debridement Description: Selective/Open Wound Instrument: Curette Bleeding: Minimum Hemostasis Achieved: Pressure Procedural Pain: 0 Post Procedural Pain: 0 Response to Treatment: Procedure  was tolerated well Level of Consciousness (Post- Awake and Alert procedure): Post Debridement Measurements of Total Wound Length: (cm) 0.5 Stage: Category/Stage III Width: (cm) 0.5 Depth: (cm) 0.1 Volume: (cm) 0.02 Character of Wound/Ulcer Post Debridement: Improved Post Procedure Diagnosis Same as Pre-procedure Notes scribed for Dr. Lady Gary By Zenaida Deed, RN Electronic Signature(s) Signed: 05/29/2022 4:00:45 PM By: Duanne Guess MD FACS Signed: 05/29/2022 4:53:54 PM By: Zenaida Deed RN, BSN [place], Jearline ([id number]) [id number] Page 2 of 13 Entered By: Zenaida Deed on 05/29/2022 09:04:36 -------------------------------------------------------------------------------- Debridement Details Patient Name: Date of Service: [name], [place] RO L 05/29/2022 8:15 A M Medical Record Number: [id number] Patient Account Number: [id number] Date of Birth/Sex: Treating RN: 1930-07-09 (89 y.o. [place], Linda Primary Care Provider: Romeo Rabon Other Clinician: Referring Provider: Treating Provider/Extender: [name] in Treatment: 10 Debridement Performed for Assessment: Wound #10 Right,Medial Lower Leg Performed By: Physician Duanne Guess, MD Debridement Type: Debridement Severity of Tissue Pre Debridement: Fat layer exposed Level of Consciousness (Pre-procedure): Awake and Alert Pre-procedure Verification/Time Out Yes - 09:00 Taken: Start Time: 09:01 Pain Control: Lidocaine 4% T opical Solution Percent of Wound Bed Debrided: 100% T Area Debrided (cm): otal 0.94 Tissue and other material debrided: Viable, Non-Viable, Eschar, Slough, Subcutaneous, Skin: Epidermis, Slough Level: Skin/Subcutaneous Tissue Debridement Description: Excisional Instrument: Curette Bleeding: Minimum Hemostasis Achieved: Pressure Procedural Pain: 0 Post Procedural Pain: 0 Response to Treatment: Procedure was tolerated well Level of  Consciousness (Post- Awake and Alert procedure): Post Debridement Measurements of Total Wound Length: (cm) 1.2 Width: (cm) 1 Depth: (cm) 0.1 Volume: (cm) 0.094 Character of Wound/Ulcer Post Debridement: Improved Severity of Tissue Post Debridement: Fat layer exposed Post Procedure Diagnosis Same as Pre-procedure Notes scribed for Dr. Lady Gary By Zenaida Deed, RN Electronic Signature(s) Signed: 05/29/2022 4:00:45 PM By: Duanne Guess MD FACS Signed: 05/29/2022 4:53:54 PM By: Zenaida Deed RN, BSN Entered By: Zenaida Deed on 05/29/2022 09:05:26 -------------------------------------------------------------------------------- Debridement Details Patient Name: Date of Service: [name], [name] 05/29/2022 8:15 A M Medical Record Number: [id number] Patient Account Number: [id number] Date  of Birth/Sex: Treating RN: Aug 26, 1930 (89 y.o. [name] Primary Care Provider: Romeo Rabon Other Clinician: Referring Provider: Treating Provider/Extender: [name] in Treatment: 10 Debridement Performed for Assessment: Wound #8 Left,Anterior Lower Leg Performed By: Physician Duanne Guess, MD Debridement Type: Debridement Severity of Tissue Pre Debridement: Fat layer exposed Level of Consciousness (Pre-procedure): Awake and Alert Pre-procedure Verification/Time Out Yes - 09:00 Taken: Start Time: 09:01 Pain Control: Lidocaine 4% Topical Solution [place], [name] ([id number]) [phone].pdf Page 3 of 13 Percent of Wound Bed Debrided: 100% T Area Debrided (cm): otal 1.77 Tissue and other material debrided: Non-Viable, Slough, Slough Level: Non-Viable Tissue Debridement Description: Selective/Open Wound Instrument: Curette Bleeding: Minimum Hemostasis Achieved: Pressure Procedural Pain: 0 Post Procedural Pain: 0 Response to Treatment: Procedure was tolerated well Level of Consciousness (Post- Awake and  Alert procedure): Post Debridement Measurements of Total Wound Length: (cm) 0.9 Width: (cm) 2.5 Depth: (cm) 0.1 Volume: (cm) 0.177 Character of Wound/Ulcer Post Debridement: Improved Severity of Tissue Post Debridement: Fat layer exposed Post Procedure Diagnosis Same as Pre-procedure Notes scribed for Dr. Lady Gary By Zenaida Deed, RN Electronic Signature(s) Signed: 05/29/2022 4:00:45 PM By: Duanne Guess MD FACS Signed: 05/29/2022 4:53:54 PM By: Zenaida Deed RN, BSN Entered By: Zenaida Deed on 05/29/2022 09:06:48 -------------------------------------------------------------------------------- Debridement Details Patient Name: Date of Service: [name], [name] 05/29/2022 8:15 A M Medical Record Number: [id number] Patient Account Number: [id number] Date of Birth/Sex: Treating RN: 13-Jul-1930 (89 y.o. [place], Linda Primary Care Provider: Romeo Rabon Other Clinician: Referring Provider: Treating Provider/Extender: [name] in Treatment: 10 Debridement Performed for Assessment: Wound #7 Left,Lateral Lower Leg Performed By: Physician Duanne Guess, MD Debridement Type: Debridement Severity of Tissue Pre Debridement: Fat layer exposed Level of Consciousness (Pre-procedure): Awake and Alert Pre-procedure Verification/Time Out Yes - 09:00 Taken: Start Time: 09:01 Pain Control: Lidocaine 4% Topical Solution Percent of Wound Bed Debrided: 100% T Area Debrided (cm): otal 11.3 Tissue and other material debrided: Non-Viable, Eschar, Slough, Slough Level: Non-Viable Tissue Debridement Description: Selective/Open Wound Instrument: Curette Bleeding: Minimum Hemostasis Achieved: Pressure Procedural Pain: 0 Post Procedural Pain: 0 Response to Treatment: Procedure was tolerated well Level of Consciousness (Post- Awake and Alert procedure): Post Debridement Measurements of Total Wound Length: (cm) 4.5 Width: (cm) 3.2 Depth: (cm)  0.1 Volume: (cm) 1.131 Character of Wound/Ulcer Post Debridement: Improved Severity of Tissue Post Debridement: Fat layer exposed [place], [place] ([id number]) [id number] Page 4 of 13 Post Procedure Diagnosis Same as Pre-procedure Notes scribed for Dr. Lady Gary By Zenaida Deed, RN Electronic Signature(s) Signed: 05/29/2022 4:00:45 PM By: Duanne Guess MD FACS Signed: 05/29/2022 4:53:54 PM By: Zenaida Deed RN, BSN Entered By: Zenaida Deed on 05/29/2022 09:10:45 -------------------------------------------------------------------------------- Debridement Details Patient Name: Date of Service: [name], [name] 05/29/2022 8:15 A M Medical Record Number: [id number] Patient Account Number: [id number] Date of Birth/Sex: Treating RN: 10/19/1930 (89 y.o. [name] Primary Care Provider: Romeo Rabon Other Clinician: Referring Provider: Treating Provider/Extender: [name] in Treatment: 10 Debridement Performed for Assessment: Wound #11 Left,Lateral Foot Performed By: Physician Duanne Guess, MD Debridement Type: Debridement Severity of Tissue Pre Debridement: Fat layer exposed Level of Consciousness (Pre-procedure): Awake and Alert Pre-procedure Verification/Time Out Yes - 09:00 Taken: Start Time: 09:01 Pain Control: Lidocaine 4% Topical Solution Percent of Wound Bed Debrided: 100% T Area Debrided (cm): otal 0.35 Tissue and other material debrided: Non-Viable, Eschar, Slough, Slough Level: Non-Viable Tissue Debridement Description: Selective/Open Wound Instrument: Curette Bleeding: Minimum Hemostasis Achieved: Pressure Procedural  Pain: 0 Post Procedural Pain: 0 Response to Treatment: Procedure was tolerated well Level of Consciousness (Post- Awake and Alert procedure): Post Debridement Measurements of Total Wound Length: (cm) 0.5 Width: (cm) 0.9 Depth: (cm) 0.1 Volume: (cm) 0.035 Character of  Wound/Ulcer Post Debridement: Improved Severity of Tissue Post Debridement: Fat layer exposed Post Procedure Diagnosis Same as Pre-procedure Notes scribed for Dr. Lady Gary By Zenaida Deed, RN Electronic Signature(s) Signed: 05/29/2022 4:00:45 PM By: Duanne Guess MD FACS Signed: 05/29/2022 4:53:54 PM By: Zenaida Deed RN, BSN Entered By: Zenaida Deed on 05/29/2022 09:11:27 -------------------------------------------------------------------------------- HPI Details Patient Name: Date of Service: [name], [name] 05/29/2022 8:15 A M Medical Record Number: [id number] Patient Account Number: [id number] Date of Birth/Sex: Treating RN: [birth year] (89 y.o. F) Primary Care Provider: Romeo Rabon Other Clinician: Valaria Good ([id number]) [id number] Page 5 of 13 Referring Provider: Treating Provider/Extender: [name] in Treatment: 10 History of Present Illness HPI Description: ADMISSION 03/20/2022 This is an 89 year old woman with coronary artery disease, on chronic corticosteroids for osteoarthritis, who presents with bilateral lower leg ulcers. They have been present for a couple of weeks. I do not have any vascular studies available to discern whether or not she has had venous reflux or arterial studies done. ABIs in clinic were 1.34 on the left and 0.95 on the right. She is not diabetic. She does not smoke. Her legs are quite edematous and she has multiple hematomas of various sizes and ages extending up to at least the distal thigh. She has 3 ulcers on the left leg with slough and eschar accumulation. On the right leg, she has a tiny superficial ulcer on the anterior tibial surface. She also has a small ulcer on the lateral aspect of her right third toe. 03/27/2022: The ulcer on her right anterior tibial surface and lateral third toe are both healed. The ulcers on her left leg are smaller but have slough  accumulation. Edema control is good. Her juxta lite stockings arrived in the mail, but she did not bring them with her today. 04/06/2022: She has a new skin tear just distal to her left knee. The ulcer on her left upper posterior calf is nearly healed under a layer of eschar. She has a spot on her right second toe that she is concerned about, saying that she frequently gets an ulcer there, but I do not see any open wound in this site. 04/21/2022: All of her wounds are healed except for the skin tear distal to her left knee. There is a layer of eschar on the surface. Underneath, the wound is quite superficial and clean. 04/19/2022: All of her wounds are healed. She is wearing a juxta lite stocking on her right leg and has brought her other stocking for [place] to apply to her left. 05/22/2022: She returns today with new ulcers on her left leg. A large hematoma/blood blister on her upper lateral leg, just distal to the knee opened up shortly after she was healed out from our clinic. There is slough on the wound surface, but no concern for infection. She has a second ulcer on her anterior tibial surface with slough on the surface. The small site on her right second toe that we have been monitoring has opened. There is some slough in the center with surrounding callus. She has a hematoma/blood blister on the medial aspect of her right lower leg that is not open, but it is certainly threatening to do so. 05/29/2022: The hematoma that had  threatened to open last week is now open. There is slough and hematoma and nonviable subcutaneous tissue present. The toe ulcer is down to a small pinhole, but there is some undermining from some dry skin present. The ulcers on her left leg are both smaller. There is some slough accumulation on both. Electronic Signature(s) Signed: 05/29/2022 9:36:05 AM By: Duanne Guess MD FACS Entered By: Duanne Guess on 05/29/2022  09:36:04 -------------------------------------------------------------------------------- Physical Exam Details Patient Name: Date of Service: [name], [name] 05/29/2022 8:15 A M Medical Record Number: [id number] Patient Account Number: [id number] Date of Birth/Sex: Treating RN: [birth year] (89 y.o. F) Primary Care Provider: Romeo Rabon Other Clinician: Referring Provider: Treating Provider/Extender: [name] in Treatment: 10 Constitutional . . . . no acute distress. Respiratory Normal work of breathing on room air. Notes 05/29/2022: The hematoma that had threatened to open last week is now open. There is slough and hematoma and nonviable subcutaneous tissue present. The toe ulcer is down to a small pinhole, but there is some undermining from some dry skin present. The ulcers on her left leg are both smaller. There is some slough accumulation on both. Electronic Signature(s) Signed: 05/29/2022 9:36:31 AM By: Duanne Guess MD FACS Entered By: Duanne Guess on 05/29/2022 09:36:30 -------------------------------------------------------------------------------- Physician Orders Details Patient Name: Date of Service: [name], [name] 05/29/2022 8:15 A M Medical Record Number: [id number] Patient Account Number: [id number] Date of Birth/Sex: Treating RN: [birth year] (89 y.o. [name] Primary Care Provider: Romeo Rabon Other Clinician: Referring Provider: Treating Provider/Extender: [name] in Treatment: [address] [place] ([id number]) [id number] Page 6 of 13 Verbal / Phone Orders: No Diagnosis Coding ICD-10 Coding Code Description [phone] Non-pressure chronic ulcer of other part of left lower leg with fat layer exposed L97.512 Non-pressure chronic ulcer of other part of right foot with fat layer exposed L97.812 Non-pressure chronic ulcer of other part of right lower leg with  fat layer exposed R60.0 Localized edema I87.2 Venous insufficiency (chronic) (peripheral) Z79.52 Long term (current) use of systemic steroids Follow-up Appointments ppointment in 1 week. - Dr. Lady Gary - Room 1 Return A Monday 5/6 @ 08:15 am Anesthetic Wound #7 Left,Lateral Lower Leg (In clinic) Topical Lidocaine 4% applied to wound bed Wound #8 Left,Anterior Lower Leg (In clinic) Topical Lidocaine 4% applied to wound bed Wound #9 Right,Medial T Second oe (In clinic) Topical Lidocaine 4% applied to wound bed Bathing/ Shower/ Hygiene May shower with protection but do not get wound dressing(s) wet. Protect dressing(s) with water repellant cover (for example, large plastic bag) or a cast cover and may then take shower. Edema Control - Lymphedema / SCD / Other Elevate legs to the level of the heart or above for 30 minutes daily and/or when sitting for 3-4 times a day throughout the day. Avoid standing for long periods of time. Exercise regularly Wound Treatment Wound #10 - Lower Leg Wound Laterality: Right, Medial Peri-Wound Care: Sween Lotion (Moisturizing lotion) 1 x Per Week Discharge Instructions: Apply moisturizing lotion as directed Prim Dressing: Maxorb Extra Ag+ Alginate Dressing, 4x4.75 (in/in) 1 x Per Week ary Discharge Instructions: Apply to wound bed as instructed Secondary Dressing: Woven Gauze Sponge, Non-Sterile 4x4 in 1 x Per Week Discharge Instructions: Apply over primary dressing as directed. Compression Wrap: Urgo K2 Lite, two layer compression system, regular 1 x Per Week Discharge Instructions: Apply Urgo K2 Lite as directed (alternative to 3 layer compression). Wound #11 - Foot Wound Laterality: Left, Lateral Peri-Wound  Care: Sween Lotion (Moisturizing lotion) 1 x Per Week Discharge Instructions: Apply moisturizing lotion as directed Prim Dressing: Maxorb Extra Ag+ Alginate Dressing, 4x4.75 (in/in) 1 x Per Week ary Discharge Instructions: Apply to wound bed as  instructed Secondary Dressing: Optifoam Non-Adhesive Dressing, 4x4 in 1 x Per Week Discharge Instructions: Apply over primary dressing cut to make foam donut Secondary Dressing: Woven Gauze Sponge, Non-Sterile 4x4 in 1 x Per Week Discharge Instructions: Apply over primary dressing as directed. Compression Wrap: Urgo K2 Lite, two layer compression system, regular 1 x Per Week Discharge Instructions: Apply Urgo K2 Lite as directed (alternative to 3 layer compression). Wound #7 - Lower Leg Wound Laterality: Left, Lateral Peri-Wound Care: Sween Lotion (Moisturizing lotion) 1 x Per Week Discharge Instructions: Apply moisturizing lotion as directed Prim Dressing: Maxorb Extra Ag+ Alginate Dressing, 4x4.75 (in/in) 1 x Per Week ary Discharge Instructions: Apply to wound bed as instructed Secondary Dressing: ABD Pad, 8x10 1 x Per Week Discharge Instructions: Apply over primary dressing as directed. [place], [name] ([id number]) [id number] Page 7 of 13 Compression Wrap: Urgo K2 Lite, two layer compression system, regular 1 x Per Week Discharge Instructions: Apply Urgo K2 Lite as directed (alternative to 3 layer compression). Wound #8 - Lower Leg Wound Laterality: Left, Anterior Peri-Wound Care: Sween Lotion (Moisturizing lotion) 1 x Per Week Discharge Instructions: Apply moisturizing lotion as directed Prim Dressing: Maxorb Extra Ag+ Alginate Dressing, 4x4.75 (in/in) 1 x Per Week ary Discharge Instructions: Apply to wound bed as instructed Secondary Dressing: ABD Pad, 8x10 1 x Per Week Discharge Instructions: Apply over primary dressing as directed. Compression Wrap: Urgo K2 Lite, two layer compression system, regular 1 x Per Week Discharge Instructions: Apply Urgo K2 Lite as directed (alternative to 3 layer compression). Wound #9 - T Second oe Wound Laterality: Right, Medial Prim Dressing: Maxorb Extra Ag+ Alginate Dressing, 2x2 (in/in) Every Other Day/30  Days ary Discharge Instructions: Apply to wound bed as instructed Secondary Dressing: Woven Gauze Sponges 2x2 in Every Other Day/30 Days Discharge Instructions: Apply over primary dressing as directed. Secured With: Insurance underwriter, Sterile 2x75 (in/in) Every Other Day/30 Days Discharge Instructions: Secure with stretch gauze as directed. Electronic Signature(s) Signed: 05/29/2022 4:00:45 PM By: Duanne Guess MD FACS Entered By: Duanne Guess on 05/29/2022 09:36:56 -------------------------------------------------------------------------------- Problem List Details Patient Name: Date of Service: [name], [name] 05/29/2022 8:15 A M Medical Record Number: [id number] Patient Account Number: [id number] Date of Birth/Sex: Treating RN: August 03, 1930 (89 y.o. [place], Linda Primary Care Provider: Romeo Rabon Other Clinician: Referring Provider: Treating Provider/Extender: [name] in Treatment: 10 Active Problems ICD-10 Encounter Code Description Active Date MDM Diagnosis [phone] Non-pressure chronic ulcer of other part of left lower leg with fat layer exposed4/22/2024 No Yes L97.512 Non-pressure chronic ulcer of other part of right foot with fat layer exposed 05/22/2022 No Yes L97.812 Non-pressure chronic ulcer of other part of right lower leg with fat layer 05/29/2022 No Yes exposed R60.0 Localized edema 03/20/2022 No Yes I87.2 Venous insufficiency (chronic) (peripheral) 03/20/2022 No Yes Z79.52 Long term (current) use of systemic steroids 03/20/2022 No Yes [place], [place]ne ([id number]) [phone].pdf Page 8 of 13 Inactive Problems Resolved Problems ICD-10 Code Description Active Date Resolved Date L97.811 Non-pressure chronic ulcer of other part of right lower leg limited to breakdown of skin 03/20/2022 03/20/2022 L97.511 Non-pressure chronic ulcer of other part of right foot limited to breakdown of skin  03/20/2022 03/20/2022 Electronic Signature(s) Signed: 05/29/2022 9:23:34 AM By: Duanne Guess MD FACS  Entered By: Duanne Guess on 05/29/2022 09:23:34 -------------------------------------------------------------------------------- Progress Note Details Patient Name: Date of Service: [name], [place] 05/29/2022 8:15 A M Medical Record Number: [id number] Patient Account Number: [id number] Date of Birth/Sex: Treating RN: [birth year] (89 y.o. F) Primary Care Provider: Romeo Rabon Other Clinician: Referring Provider: Treating Provider/Extender: [name] in Treatment: 10 Subjective Chief Complaint Information obtained from Patient Patient presents for treatment of open ulcers due to venous insufficiency History of Present Illness (HPI) ADMISSION 03/20/2022 This is an 89 year old woman with coronary artery disease, on chronic corticosteroids for osteoarthritis, who presents with bilateral lower leg ulcers. They have been present for a couple of weeks. I do not have any vascular studies available to discern whether or not she has had venous reflux or arterial studies done. ABIs in clinic were 1.34 on the left and 0.95 on the right. She is not diabetic. She does not smoke. Her legs are quite edematous and she has multiple hematomas of various sizes and ages extending up to at least the distal thigh. She has 3 ulcers on the left leg with slough and eschar accumulation. On the right leg, she has a tiny superficial ulcer on the anterior tibial surface. She also has a small ulcer on the lateral aspect of her right third toe. 03/27/2022: The ulcer on her right anterior tibial surface and lateral third toe are both healed. The ulcers on her left leg are smaller but have slough accumulation. Edema control is good. Her juxta lite stockings arrived in the mail, but she did not bring them with her today. 04/06/2022: She has a new skin tear just distal to her left knee. The  ulcer on her left upper posterior calf is nearly healed under a layer of eschar. She has a spot on her right second toe that she is concerned about, saying that she frequently gets an ulcer there, but I do not see any open wound in this site. 04/21/2022: All of her wounds are healed except for the skin tear distal to her left knee. There is a layer of eschar on the surface. Underneath, the wound is quite superficial and clean. 04/19/2022: All of her wounds are healed. She is wearing a juxta lite stocking on her right leg and has brought her other stocking for [place] to apply to her left. 05/22/2022: She returns today with new ulcers on her left leg. A large hematoma/blood blister on her upper lateral leg, just distal to the knee opened up shortly after she was healed out from our clinic. There is slough on the wound surface, but no concern for infection. She has a second ulcer on her anterior tibial surface with slough on the surface. The small site on her right second toe that we have been monitoring has opened. There is some slough in the center with surrounding callus. She has a hematoma/blood blister on the medial aspect of her right lower leg that is not open, but it is certainly threatening to do so. 05/29/2022: The hematoma that had threatened to open last week is now open. There is slough and hematoma and nonviable subcutaneous tissue present. The toe ulcer is down to a small pinhole, but there is some undermining from some dry skin present. The ulcers on her left leg are both smaller. There is some slough accumulation on both. Patient History Information obtained from Patient. Family History Heart Disease - Father, Hypertension - Mother, No family history of Cancer, Diabetes, Hereditary Spherocytosis, Kidney Disease, Lung  Disease, Seizures, Stroke, Thyroid Problems, Tuberculosis. Social History Never smoker, Marital Status - Married, Alcohol Use - Rarely, Drug Use - No History, Caffeine Use -  Moderate. Medical History Eyes DEVORY, MCKINZIE ([id number]) [id number] Page 9 of 13 Patient has history of Cataracts Hematologic/Lymphatic Patient has history of Lymphedema Respiratory Patient has history of Sleep Apnea Cardiovascular Patient has history of Coronary Artery Disease, Hypertension Gastrointestinal Patient has history of Hepatitis B - 50+ yrs ago Musculoskeletal Patient has history of Osteoarthritis Hospitalization/Surgery History - abdominal hysterectomy. - cardiac catheterization. - cataract extraction bilat.. - colonoscopy. - bilateral total knee replacement. - upper GI endoscopy. Medical A Surgical History Notes nd Gastrointestinal diverticulosis, GERD Musculoskeletal osteoporosis Objective Constitutional no acute distress. Vitals Time Taken: 8:31 AM, Height: 68 in, Weight: 180 lbs, BMI: 27.4, Temperature: 97.8 F, Pulse: 68 bpm, Respiratory Rate: 18 breaths/min, Blood Pressure: 125/73 mmHg. Respiratory Normal work of breathing on room air. General Notes: 05/29/2022: The hematoma that had threatened to open last week is now open. There is slough and hematoma and nonviable subcutaneous tissue present. The toe ulcer is down to a small pinhole, but there is some undermining from some dry skin present. The ulcers on her left leg are both smaller. There is some slough accumulation on both. Integumentary (Hair, Skin) Wound #10 status is Open. Original cause of wound was Gradually Appeared. The date acquired was: 05/29/2022. The wound is located on the Right,Medial Lower Leg. The wound measures 1.2cm length x 1cm width x 0.1cm depth; 0.942cm^2 area and 0.094cm^3 volume. There is Fat Layer (Subcutaneous Tissue) exposed. There is no tunneling or undermining noted. There is a medium amount of serosanguineous drainage noted. The wound margin is flat and intact. There is no granulation within the wound bed. There is a large (67-100%) amount of  necrotic tissue within the wound bed including Eschar and Adherent Slough. The periwound skin appearance had no abnormalities noted for texture. The periwound skin appearance had no abnormalities noted for moisture. The periwound skin appearance had no abnormalities noted for color. Wound #11 status is Open. Original cause of wound was Gradually Appeared. The date acquired was: 05/29/2022. The wound is located on the Left,Lateral Foot. The wound measures 0.5cm length x 0.9cm width x 0.1cm depth; 0.353cm^2 area and 0.035cm^3 volume. There is Fat Layer (Subcutaneous Tissue) exposed. There is no tunneling or undermining noted. There is a none present amount of drainage noted. The wound margin is flat and intact. There is no granulation within the wound bed. There is a large (67-100%) amount of necrotic tissue within the wound bed including Eschar. The periwound skin appearance had no abnormalities noted for texture. The periwound skin appearance had no abnormalities noted for moisture. The periwound skin appearance had no abnormalities noted for color. Periwound temperature was noted as No Abnormality. Wound #7 status is Open. Original cause of wound was Blister. The date acquired was: 05/15/2022. The wound has been in treatment 1 weeks. The wound is located on the Left,Lateral Lower Leg. The wound measures 4.5cm length x 3.2cm width x 0.1cm depth; 11.31cm^2 area and 1.131cm^3 volume. There is Fat Layer (Subcutaneous Tissue) exposed. There is no tunneling or undermining noted. There is a medium amount of purulent drainage noted. The wound margin is flat and intact. There is medium (34-66%) red granulation within the wound bed. There is a medium (34-66%) amount of necrotic tissue within the wound bed including Eschar and Adherent Slough. The periwound skin appearance had no abnormalities noted for texture. The periwound skin  appearance had no abnormalities noted for moisture. The periwound skin appearance  had no abnormalities noted for color. Periwound temperature was noted as No Abnormality. The periwound has tenderness on palpation. Wound #8 status is Open. Original cause of wound was Blister. The date acquired was: 05/15/2022. The wound has been in treatment 1 weeks. The wound is located on the Left,Anterior Lower Leg. The wound measures 0.9cm length x 2.5cm width x 0.1cm depth; 1.767cm^2 area and 0.177cm^3 volume. There is Fat Layer (Subcutaneous Tissue) exposed. There is no tunneling or undermining noted. There is a medium amount of serosanguineous drainage noted. The wound margin is flat and intact. There is large (67-100%) red granulation within the wound bed. There is no necrotic tissue within the wound bed. The periwound skin appearance had no abnormalities noted for texture. The periwound skin appearance had no abnormalities noted for moisture. The periwound skin appearance exhibited: Hemosiderin Staining. Periwound temperature was noted as No Abnormality. The periwound has tenderness on palpation. Wound #9 status is Open. Original cause of wound was Gradually Appeared. The date acquired was: 04/30/2020. The wound has been in treatment 1 weeks. The wound is located on the Right,Medial T Second. The wound measures 0.2cm length x 0.2cm width x 0.2cm depth; 0.031cm^2 area and 0.006cm^3 volume. oe There is Fat Layer (Subcutaneous Tissue) exposed. There is no tunneling noted, however, there is undermining starting at 12:00 and ending at 12:00 with a maximum distance of 0.3cm. There is a small amount of purulent drainage noted. The wound margin is distinct with the outline attached to the wound base. There is large (67-100%) pink granulation within the wound bed. There is no necrotic tissue within the wound bed. The periwound skin appearance had no abnormalities noted for moisture. The periwound skin appearance had no abnormalities noted for color. The periwound skin appearance exhibited:  Callus. 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 There was a Excisional Skin/Subcutaneous Tissue Debridement with a total area of 0.94 sq cm performed by Duanne Guess, MD. With the following instrument(s): Curette to remove Viable and Non-Viable tissue/material. Material removed includes Eschar, Subcutaneous Tissue, Slough, and Skin: Epidermis after achieving pain control using Lidocaine 4% Topical Solution. No specimens were taken. A time out was conducted at 09:00, prior to the start of the procedure. A Minimum amount of bleeding was controlled with Pressure. The procedure was tolerated well with a pain level of 0 throughout and a pain level of 0 following the procedure. Post Debridement Measurements: 1.2cm length x 1cm width x 0.1cm depth; 0.094cm^3 volume. Character of Wound/Ulcer Post Debridement is improved. Severity of Tissue Post Debridement is: Fat layer exposed. Post procedure Diagnosis Wound #10: Same as Pre-Procedure General Notes: scribed for Dr. Lady Gary By Zenaida Deed, RN. 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 There was a Double Layer Compression Therapy Procedure by Zenaida Deed, RN. Post procedure Diagnosis Wound #10: Same as Pre-Procedure Notes: urgo lite. Wound #11 Pre-procedure diagnosis  of Wound #11 is a Venous  Leg Ulcer located on the Left,Lateral Foot .Severity of Tissue Pre Debridement is: Fat layer exposed. There was a Selective/Open Wound Non-Viable Tissue Debridement with a total area of 0.35 sq cm performed by Duanne Guess, MD. With the following instrument(s): Curette to remove Non-Viable tissue/material. Material removed includes Eschar and Slough and after achieving pain control using Lidocaine 4% Topical Solution. No specimens were taken. A time out was conducted at 09:00, prior to the start of the procedure. A Minimum amount of bleeding was controlled with Pressure. The procedure was tolerated well with a pain level of 0 throughout and a pain level of 0 following the procedure. Post Debridement Measurements: 0.5cm length x 0.9cm width x 0.1cm depth; 0.035cm^3 volume. Character of Wound/Ulcer Post Debridement is improved. Severity of Tissue Post Debridement is: Fat layer exposed. Post procedure Diagnosis Wound #11: Same as Pre-Procedure General Notes: scribed for Dr. Lady Gary By Zenaida Deed, RN. Wound #7 Pre-procedure diagnosis of Wound #7 is a Venous Leg Ulcer located on the Left,Lateral Lower Leg .Severity of Tissue Pre Debridement is: Fat layer exposed. There was a Selective/Open Wound Non-Viable Tissue Debridement with a total area of 11.3 sq cm performed by Duanne Guess, MD. With the following instrument(s): Curette to remove Non-Viable tissue/material. Material removed includes Eschar and Slough and after achieving pain control using Lidocaine 4% Topical Solution. No specimens were taken. A time out was conducted at 09:00, prior to the start of the procedure. A Minimum amount of bleeding was controlled with Pressure. The procedure was tolerated well with a pain level of 0 throughout and a pain level of 0 following the procedure. Post Debridement Measurements: 4.5cm length x 3.2cm width x 0.1cm depth; 1.131cm^3 volume. Character of Wound/Ulcer Post Debridement is improved. Severity  of Tissue Post Debridement is: Fat layer exposed. Post procedure Diagnosis Wound #7: Same as Pre-Procedure General Notes: scribed for Dr. Lady Gary By Zenaida Deed, RN. 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 There was a Double Layer Compression Therapy Procedure by Zenaida Deed, RN. Post procedure Diagnosis Wound #7: Same as Pre-Procedure Notes: urgo lite. Wound #8 Pre-procedure diagnosis of Wound #8 is a Lymphedema located on the Left,Anterior Lower Leg .Severity of Tissue Pre Debridement is: Fat layer exposed. There was a Selective/Open Wound Non-Viable Tissue Debridement with a total area of 1.77 sq cm performed by Duanne Guess, MD. With the following instrument(s): Curette to remove Non-Viable tissue/material. Material removed includes [facility] after achieving pain control using Lidocaine 4% Topical Solution. No specimens were taken. A time out was conducted at 09:00, prior to the start of the procedure. A Minimum amount of bleeding was controlled with Pressure. The procedure was tolerated well with a pain level of 0 throughout and a pain level of 0 following the procedure. Post Debridement Measurements: 0.9cm length x 2.5cm width x 0.1cm depth; 0.177cm^3 volume. Character of Wound/Ulcer Post Debridement is improved. Severity of Tissue Post Debridement is: Fat layer exposed. Post procedure Diagnosis Wound #8: Same as Pre-Procedure General Notes: scribed for Dr. Lady Gary By Zenaida Deed, RN. Wound #9 Pre-procedure diagnosis of Wound #9 is a Pressure Ulcer located on the Right,Medial T Second . There was a Selective/Open Wound Skin/Epidermis oe Debridement with a total area of 0.2 sq cm performed by Duanne Guess, MD. With the following instrument(s): Curette to remove Non-Viable tissue/material. Material removed includes [facility] and Skin: Epidermis and after achieving pain control using Lidocaine 4% T  opical Solution. No  specimens were taken. A time out was conducted at 09:00, prior to the start of the procedure. A Minimum amount of bleeding was controlled with Pressure. The procedure was tolerated well with a pain level of 0 throughout and a pain level of 0 following the procedure. Post Debridement Measurements: 0.5cm length x 0.5cm width x 0.1cm depth; 0.02cm^3 volume. Post debridement Stage noted as Category/Stage III. Character of Wound/Ulcer Post Debridement is improved. Post procedure Diagnosis Wound #9: Same as Pre-Procedure General Notes: scribed for Dr. Lady Gary By Zenaida Deed, RN. Plan Follow-up Appointments: Return Appointment in 1 week. - Dr. Lady Gary - Room 1 Monday 5/6 @ 08:15 am KATRINE, RADICH ([id number]) [id number] Page 11 of 13 Anesthetic: Wound #7 Left,Lateral Lower Leg: (In clinic) Topical Lidocaine 4% applied to wound bed Wound #8 Left,Anterior Lower Leg: (In clinic) Topical Lidocaine 4% applied to wound bed Wound #9 Right,Medial T Second: oe (In clinic) Topical Lidocaine 4% applied to wound bed Bathing/ Shower/ Hygiene: May shower with protection but do not get wound dressing(s) wet. Protect dressing(s) with water repellant cover (for example, large plastic bag) or a cast cover and may then take shower. Edema Control - Lymphedema / SCD / Other: Elevate legs to the level of the heart or above for 30 minutes daily and/or when sitting for 3-4 times a day throughout the day. Avoid standing for long periods of time. Exercise regularly WOUND #10: - Lower Leg Wound Laterality: Right, Medial Peri-Wound Care: Sween Lotion (Moisturizing lotion) 1 x Per Week/ Discharge Instructions: Apply moisturizing lotion as directed Prim Dressing: Maxorb Extra Ag+ Alginate Dressing, 4x4.75 (in/in) 1 x Per Week/ ary Discharge Instructions: Apply to wound bed as instructed Secondary Dressing: Woven Gauze Sponge, Non-Sterile 4x4 in 1 x Per Week/ Discharge Instructions: Apply  over primary dressing as directed. Com pression Wrap: Urgo K2 Lite, two layer compression system, regular 1 x Per Week/ Discharge Instructions: Apply Urgo K2 Lite as directed (alternative to 3 layer compression). WOUND #11: - Foot Wound Laterality: Left, Lateral Peri-Wound Care: Sween Lotion (Moisturizing lotion) 1 x Per Week/ Discharge Instructions: Apply moisturizing lotion as directed Prim Dressing: Maxorb Extra Ag+ Alginate Dressing, 4x4.75 (in/in) 1 x Per Week/ ary Discharge Instructions: Apply to wound bed as instructed Secondary Dressing: Optifoam Non-Adhesive Dressing, 4x4 in 1 x Per Week/ Discharge Instructions: Apply over primary dressing cut to make foam donut Secondary Dressing: Woven Gauze Sponge, Non-Sterile 4x4 in 1 x Per Week/ Discharge Instructions: Apply over primary dressing as directed. Com pression Wrap: Urgo K2 Lite, two layer compression system, regular 1 x Per Week/ Discharge Instructions: Apply Urgo K2 Lite as directed (alternative to 3 layer compression). WOUND #7: - Lower Leg Wound Laterality: Left, Lateral Peri-Wound Care: Sween Lotion (Moisturizing lotion) 1 x Per Week/ Discharge Instructions: Apply moisturizing lotion as directed Prim Dressing: Maxorb Extra Ag+ Alginate Dressing, 4x4.75 (in/in) 1 x Per Week/ ary Discharge Instructions: Apply to wound bed as instructed Secondary Dressing: ABD Pad, 8x10 1 x Per Week/ Discharge Instructions: Apply over primary dressing as directed. Com pression Wrap: Urgo K2 Lite, two layer compression system, regular 1 x Per Week/ Discharge Instructions: Apply Urgo K2 Lite as directed (alternative to 3 layer compression). WOUND #8: - Lower Leg Wound Laterality: Left, Anterior Peri-Wound Care: Sween Lotion (Moisturizing lotion) 1 x Per Week/ Discharge Instructions: Apply moisturizing lotion as directed Prim Dressing: Maxorb Extra Ag+ Alginate Dressing, 4x4.75 (in/in) 1 x Per Week/ ary Discharge Instructions: Apply to wound bed  as instructed Secondary  Dressing: ABD Pad, 8x10 1 x Per Week/ Discharge Instructions: Apply over primary dressing as directed. Com pression Wrap: Urgo K2 Lite, two layer compression system, regular 1 x Per Week/ Discharge Instructions: Apply Urgo K2 Lite as directed (alternative to 3 layer compression). WOUND #9: - T Second Wound Laterality: Right, Medial oe Prim Dressing: Maxorb Extra Ag+ Alginate Dressing, 2x2 (in/in) Every Other Day/30 Days ary Discharge Instructions: Apply to wound bed as instructed Secondary Dressing: Woven Gauze Sponges 2x2 in Every Other Day/30 Days Discharge Instructions: Apply over primary dressing as directed. Secured With: Insurance underwriter, Sterile 2x75 (in/in) Every Other Day/30 Days Discharge Instructions: Secure with stretch gauze as directed. 05/29/2022: The hematoma that had threatened to open last week is now open. There is slough and hematoma and nonviable subcutaneous tissue present. The toe ulcer is down to a small pinhole, but there is some undermining from some dry skin present. The ulcers on her left leg are both smaller. There is some slough accumulation on both. I used a curette to debride slough, hematoma, and subcutaneous tissue from the new wound on her right lower leg. I debrided skin and slough from the right second toe ulcer. I debrided slough and eschar from both of the ulcers on her left leg. We will apply silver alginate to all wounds and bilateral 3 layer compression equivalent. She will follow-up in 1 week. Electronic Signature(s) Signed: 05/29/2022 9:37:54 AM By: Duanne Guess MD FACS Entered By: Duanne Guess on 05/29/2022 09:37:54 -------------------------------------------------------------------------------- HxROS Details Patient Name: Date of Service: [name], [name] 05/29/2022 8:15 A M Medical Record Number: [id number] Patient Account Number: [id number] Date of Birth/Sex: Treating RN: 1930/08/04 (89 y.o.  F) Primary Care Provider: Romeo Rabon Other Clinician: Referring Provider: Treating Provider/Extender: Marcie Bal [place], [name] ([id number]) [id number] Page 12 of 13 Weeks in Treatment: 10 Information Obtained From Patient Eyes Medical History: Positive for: Cataracts Hematologic/Lymphatic Medical History: Positive for: Lymphedema Respiratory Medical History: Positive for: Sleep Apnea Cardiovascular Medical History: Positive for: Coronary Artery Disease; Hypertension Gastrointestinal Medical History: Positive for: Hepatitis B - 50+ yrs ago Past Medical History Notes: diverticulosis, GERD Musculoskeletal Medical History: Positive for: Osteoarthritis Past Medical History Notes: osteoporosis HBO Extended History Items Eyes: Cataracts Immunizations Pneumococcal Vaccine: Received Pneumococcal Vaccination: Yes Received Pneumococcal Vaccination On or After 60th Birthday: Yes Implantable Devices No devices added Hospitalization / Surgery History Type of Hospitalization/Surgery abdominal hysterectomy cardiac catheterization cataract extraction bilat. colonoscopy bilateral total knee replacement upper GI endoscopy Family and Social History Cancer: No; Diabetes: No; Heart Disease: Yes - Father; Hereditary Spherocytosis: No; Hypertension: Yes - Mother; Kidney Disease: No; Lung Disease: No; Seizures: No; Stroke: No; Thyroid Problems: No; Tuberculosis: No; Never smoker; Marital Status - Married; Alcohol Use: Rarely; Drug Use: No History; Caffeine Use: Moderate; Financial Concerns: No; Food, Clothing or Shelter Needs: No; Support System Lacking: No; Transportation Concerns: No Electronic Signature(s) Signed: 05/29/2022 4:00:45 PM By: Duanne Guess MD FACS Entered By: Duanne Guess on 05/29/2022 09:36:12 SuperBill Details -------------------------------------------------------------------------------- Valaria Good  ([id number]) [id number] Page 13 of 13 Patient Name: Date of Service: [name], [place] 05/29/2022 Medical Record Number: [id number] Patient Account Number: [id number] Date of Birth/Sex: Treating RN: 1931-01-20 (89 y.o. F) Primary Care Provider: Romeo Rabon Other Clinician: Referring Provider: Treating Provider/Extender: [name] in Treatment: 10 Diagnosis Coding ICD-10 Codes Code Description [phone] Non-pressure chronic ulcer of other part of left lower leg with fat layer exposed L97.512 Non-pressure chronic ulcer of other part  of right foot with fat layer exposed L97.812 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ed R60.0 Localized edema I87.2 Venous insufficiency (chronic) (peripheral) Z79.52 Long term (current) use of systemic steroids Facility Procedures CPT4 Code Description Modifier Quantity [id number] 11042 - DEB SUBQ TISSUE 20 SQ CM/< 1 ICD-10 Diagnosis Description L97.812 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ed [id number] - DEBRIDE WOUND 1ST 20 SQ CM OR < 1 ICD-10 Diagnosis Description L97.822 Non-pressure chronic ulcer of other part of left lower leg with fat layer exposed L97.512 Non-pressure chronic ulcer of other part of right foot with fat layer exposed Physician Procedures Quantity CPT4 Code Description Modifier [id number] - WC PHYS LEVEL 3 - EST PT 25 1 ICD-10 Diagnosis Description L97.822 Non-pressure chronic ulcer of other part of left lower leg with fat layer exposed L97.512 Non-pressure chronic ulcer of other part of right foot with fat layer exposed L97.812 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ed I87.2 Venous insufficiency (chronic) (peripheral) [id number] 11042 - WC PHYS SUBQ TISS 20 SQ CM 1 ICD-10 Diagnosis Description L97.812 Non-pressure chronic ulcer of other part of  right lower leg with fat layer exposed [id number] 97597 - WC PHYS DEBR WO ANESTH 20 SQ CM 1 ICD-10 Diagnosis Description L97.822 Non-pressure chronic ulcer of other part of left lower leg with fat layer exposed L97.512 Non-pressure chronic ulcer of other part of right foot with fat layer exposed Electronic Signature(s) Signed: 05/29/2022 9:38:26 AM By: Duanne Guess MD FACS Entered By: Duanne Guess on 05/29/2022 09:38:26

## 2022-06-05 ENCOUNTER — Encounter (HOSPITAL_BASED_OUTPATIENT_CLINIC_OR_DEPARTMENT_OTHER): Payer: Medicare Other | Attending: General Surgery | Admitting: General Surgery

## 2022-06-05 DIAGNOSIS — Z8249 Family history of ischemic heart disease and other diseases of the circulatory system: Secondary | ICD-10-CM | POA: Diagnosis not present

## 2022-06-05 DIAGNOSIS — I10 Essential (primary) hypertension: Secondary | ICD-10-CM | POA: Diagnosis not present

## 2022-06-05 DIAGNOSIS — L97512 Non-pressure chronic ulcer of other part of right foot with fat layer exposed: Secondary | ICD-10-CM | POA: Diagnosis not present

## 2022-06-05 DIAGNOSIS — L97822 Non-pressure chronic ulcer of other part of left lower leg with fat layer exposed: Secondary | ICD-10-CM | POA: Diagnosis not present

## 2022-06-05 DIAGNOSIS — I2510 Atherosclerotic heart disease of native coronary artery without angina pectoris: Secondary | ICD-10-CM | POA: Insufficient documentation

## 2022-06-05 DIAGNOSIS — L84 Corns and callosities: Secondary | ICD-10-CM | POA: Diagnosis not present

## 2022-06-05 DIAGNOSIS — I890 Lymphedema, not elsewhere classified: Secondary | ICD-10-CM | POA: Diagnosis not present

## 2022-06-05 DIAGNOSIS — L97812 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ed: Secondary | ICD-10-CM | POA: Diagnosis not present

## 2022-06-05 DIAGNOSIS — M79673 Pain in unspecified foot: Secondary | ICD-10-CM | POA: Insufficient documentation

## 2022-06-05 DIAGNOSIS — R600 Localized edema: Secondary | ICD-10-CM | POA: Insufficient documentation

## 2022-06-05 DIAGNOSIS — Z7952 Long term (current) use of systemic steroids: Secondary | ICD-10-CM | POA: Diagnosis not present

## 2022-06-05 DIAGNOSIS — I872 Venous insufficiency (chronic) (peripheral): Secondary | ICD-10-CM | POA: Diagnosis present

## 2022-06-06 NOTE — Progress Notes (Signed)
[name], [name] ([id number]) [id number] Page 1 of 11 Visit Report for 06/05/2022 Arrival Information Details Patient Name: Date of Service: [name], [place] RO L 06/05/2022 8:15 A M Medical Record Number: [id number] Patient Account Number: [id number] Date of Birth/Sex: Treating RN: 1930-07-26 (89 y.o. [place], Linda Primary Care Harlene Petralia: [name] Other Clinician: Referring Neila Teem: Treating Zoriyah Scheidegger/Extender: [name] in Treatment: 11 Visit Information History Since Last Visit Added or deleted any medications: No Patient Arrived: [name] Any new allergies or adverse reactions: No Arrival Time: 08:29 Had a fall or experienced change in No Accompanied By: spouse activities of daily living that may affect Transfer Assistance: None risk of falls: Patient Identification Verified: Yes Signs or symptoms of abuse/neglect since last visito No Secondary Verification Process Completed: Yes Hospitalized since last visit: No Patient Requires Transmission-Based Precautions: No Implantable device outside of the clinic excluding No Patient Has Alerts: No cellular tissue based products placed in the center since last visit: Has Dressing in Place as Prescribed: Yes Has Compression in Place as Prescribed: Yes Pain Present Now: No Electronic Signature(s) Signed: 06/05/2022 5:06:07 PM By: Zenaida Deed RN, BSN Entered By: Zenaida Deed on 06/05/2022 08:39:48 -------------------------------------------------------------------------------- Compression Therapy Details Patient Name: Date of Service: [name], [name] 06/05/2022 8:15 A M Medical Record Number: [id number] Patient Account Number: [id number] Date of Birth/Sex: Treating RN: [birth year] (89 y.o. [name] Primary Care Roye Gustafson: [name] Other Clinician: Referring Malikah Lakey: Treating Minh Roanhorse/Extender: [name] in Treatment:  11 Compression Therapy Performed for Wound Assessment: Wound #10 Right,Medial Lower Leg Performed By: Clinician Zenaida Deed, RN Compression Type: Double Layer Post Procedure Diagnosis Same as Pre-procedure Notes Jobe Gibbon Electronic Signature(s) Signed: 06/05/2022 5:06:07 PM By: Zenaida Deed RN, BSN Entered By: Zenaida Deed on 06/05/2022 09:07:21 -------------------------------------------------------------------------------- Compression Therapy Details Patient Name: Date of Service: [name], [name] 06/05/2022 8:15 A M Medical Record Number: [id number] Patient Account Number: [id number] Date of Birth/Sex: Treating RN: [birth year] (89 y.o. [name] Primary Care Joelle Flessner: [name] Other Clinician: Referring Alam Guterrez: Treating Zaelyn Noack/Extender: [name] in Treatment: 11 Compression Therapy Performed for Wound Assessment: Wound #8 Left,Anterior Lower Leg Performed By: Clinician Zenaida Deed, RN Compression TypeLoreli Slot, [name] ([id number]) [id number] Page 2 of 11 Post Procedure Diagnosis Same as Pre-procedure Notes urgo Engineer, building services) Signed: 06/05/2022 5:06:07 PM By: Zenaida Deed RN, BSN Entered By: Zenaida Deed on 06/05/2022 09:07:21 -------------------------------------------------------------------------------- Lower Extremity Assessment Details Patient Name: Date of Service: [name], [name] 06/05/2022 8:15 A M Medical Record Number: [id number] Patient Account Number: [id number] Date of Birth/Sex: Treating RN: November 29, 1930 (89 y.o. [name] Primary Care Cataleya Cristina: [name] Other Clinician: Referring Ismail Graziani: Treating Mikle Sternberg/Extender: [name] in Treatment: 11 Edema Assessment Assessed: [name]: No] [name]: No] Edema: [Left: Yes] [Right: Yes] Calf Left: Right: Point of Measurement: From Medial Instep 29.5 cm  29.5 cm Ankle Left: Right: Point of Measurement: From Medial Instep 18.5 cm 19.5 cm Vascular Assessment Pulses: Dorsalis Pedis Palpable: [Left:Yes] [Right:Yes] Electronic Signature(s) Signed: 06/05/2022 5:06:07 PM By: Zenaida Deed RN, BSN Entered By: Zenaida Deed on 06/05/2022 08:45:40 -------------------------------------------------------------------------------- Multi Wound Chart Details Patient Name: Date of Service: [name], [name] 06/05/2022 8:15 A M Medical Record Number: [id number] Patient Account Number: [id number] Date of Birth/Sex: Treating RN: 1930/02/13 (89 y.o. F) Primary Care Trella Thurmond: [name] Other Clinician: Referring Armenta Erskin: Treating Xzander Gilham/Extender: [name] in Treatment: 11 Vital Signs Height(in):  68 Pulse(bpm): 76 Weight(lbs): 180 Blood Pressure(mmHg): 150/81 Body Mass Index(BMI): 27.4 Temperature(F): 97.9 Respiratory Rate(breaths/min): 22 [10:Photos:] [phone].pdf Page 3 of 11] Right, Medial Lower Leg Left, Lateral Foot Left, Lateral Lower Leg Wound Location: Gradually Appeared Gradually Appeared Blister Wounding Event: Venous Leg Ulcer Venous Leg Ulcer Venous Leg Ulcer Primary Etiology: N/A N/A Lymphedema Secondary Etiology: Cataracts, Lymphedema, Sleep Cataracts, Lymphedema, Sleep Cataracts, Lymphedema, Sleep Comorbid History: Apnea, Coronary Artery Disease, Apnea, Coronary Artery Disease, Apnea, Coronary Artery Disease, Hypertension, Hepatitis B, Hypertension, Hepatitis B, Hypertension, Hepatitis B, Osteoarthritis Osteoarthritis Osteoarthritis 05/29/2022 05/29/2022 05/15/2022 Date Acquired: 1 1 2  Weeks of Treatment: Open Open Open Wound Status: No No No Wound Recurrence: 1x1.3x0.1 0.4x0.4x0.1 2x2.5x0.1 Measurements L x W x D (cm) 1.021 0.126 3.927 A (cm) : rea 0.102 0.013 0.393 Volume (cm) : -8.40% 64.30% 76.90% % Reduction in A rea: -8.50% 62.90% 76.80% %  Reduction in Volume: Full Thickness Without Exposed Full Thickness Without Exposed Full Thickness Without Exposed Classification: Support Structures Support Structures Support Structures Medium None Present Medium Exudate A mount: Serosanguineous N/A Serosanguineous Exudate Type: red, brown N/A red, brown Exudate Color: Flat and Intact Flat and Intact Flat and Intact Wound Margin: None Present (0%) None Present (0%) Large (67-100%) Granulation A mount: N/A N/A Red Granulation Quality: Large (67-100%) Large (67-100%) Small (1-33%) Necrotic A mount: Adherent Slough Eschar Eschar Necrotic Tissue: Fat Layer (Subcutaneous Tissue): Yes Fat Layer (Subcutaneous Tissue): Yes Fat Layer (Subcutaneous Tissue): Yes Exposed Structures: Fascia: No Fascia: No Fascia: No Tendon: No Tendon: No Tendon: No Muscle: No Muscle: No Muscle: No Joint: No Joint: No Joint: No Bone: No Bone: No Bone: No None Small (1-33%) Medium (34-66%) Epithelialization: Debridement - Excisional Debridement - Selective/Open Wound Debridement - Selective/Open Wound Debridement: Pre-procedure Verification/Time Out 09:05 09:05 09:05 Taken: N/A Lidocaine 4% Topical Solution Lidocaine 4% Topical Solution Pain Control: Fat, Ambulance person, Ambulance person Tissue Debrided: Skin/Subcutaneous Tissue Non-Viable Tissue Non-Viable Tissue Level: 1.12 0.13 3.92 Debridement A (sq cm): rea Curette Curette Curette Instrument: Minimum Minimum Minimum Bleeding: Pressure Pressure Pressure Hemostasis A chieved: 0 0 0 Procedural Pain: 0 0 0 Post Procedural Pain: Procedure was tolerated well Procedure was tolerated well Procedure was tolerated well Debridement Treatment Response: 1.1x1.3x0.1 0.4x0.4x0.1 2x2.5x0.1 Post Debridement Measurements L x W x D (cm) 0.112 0.013 0.393 Post Debridement Volume: (cm) N/A N/A N/A Post Debridement Stage: No Abnormalities Noted No Abnormalities Noted No  Abnormalities Noted Periwound Skin Texture: No Abnormalities Noted No Abnormalities Noted No Abnormalities Noted Periwound Skin Moisture: No Abnormalities Noted No Abnormalities Noted Ecchymosis: No Periwound Skin Color: No Abnormality No Abnormality No Abnormality Temperature: N/A N/A Yes Tenderness on Palpation: Compression Therapy Debridement Debridement Procedures Performed: Debridement Wound Number: 8 9 N/A Photos: N/A Left, Anterior Lower Leg Right, Medial T Second oe N/A Wound Location: Blister Gradually Appeared N/A Wounding Event: Lymphedema Pressure Ulcer N/A Primary Etiology: Venous Leg Ulcer N/A N/A Secondary Etiology: Cataracts, Lymphedema, Sleep Cataracts, Lymphedema, Sleep N/A Comorbid History: Apnea, Coronary Artery Disease, Apnea, Coronary Artery Disease, Hypertension, Hepatitis B, Hypertension, Hepatitis B, Osteoarthritis Osteoarthritis 05/15/2022 04/30/2020 N/A Date Acquired: 2 2 N/A Tania Ade of Treatment: [place], [name] ([id number]) [id number] Page 4 of 11 Open Open N/A Wound Status: No No N/A Wound Recurrence: 0.3x0.9x0.1 0.3x0.5x0.1 N/A Measurements L x W x D (cm) 0.212 0.118 N/A A (cm) : rea 0.021 0.012 N/A Volume (cm) : 90.40% -280.60% N/A % Reduction in Area: 90.50% -300.00% N/A % Reduction in Volume: Full Thickness Without Exposed Category/Stage III N/A Classification: Support Structures Small  None Present N/A Exudate A mount: Serosanguineous N/A N/A Exudate Type: red, brown N/A N/A Exudate Color: Flat and Intact Distinct, outline attached N/A Wound Margin: Large (67-100%) Large (67-100%) N/A Granulation A mount: Red Pink, Pale N/A Granulation Quality: None Present (0%) Small (1-33%) N/A Necrotic A mount: N/A Eschar N/A Necrotic Tissue: Fat Layer (Subcutaneous Tissue): Yes Fat Layer (Subcutaneous Tissue): Yes N/A Exposed Structures: Fascia: No Fascia: No Tendon: No Tendon: No Muscle: No Muscle:  No Joint: No Joint: No Bone: No Bone: No Large (67-100%) Medium (34-66%) N/A Epithelialization: Debridement - Selective/Open Wound Debridement - Selective/Open Wound N/A Debridement: Pre-procedure Verification/Time Out 09:05 09:05 N/A Taken: Lidocaine 4% Topical Solution Lidocaine 4% T opical Solution N/A Pain Control: Necrotic/Eschar Callus, Slough N/A Tissue Debrided: Non-Viable Tissue Skin/Epidermis N/A Level: 0.21 0.12 N/A Debridement A (sq cm): rea Curette Curette N/A Instrument: Minimum Minimum N/A Bleeding: Pressure Pressure N/A Hemostasis A chieved: 0 0 N/A Procedural Pain: 0 0 N/A Post Procedural Pain: Procedure was tolerated well Procedure was tolerated well N/A Debridement Treatment Response: 0.3x0.9x0.1 0.3x0.5x0.1 N/A Post Debridement Measurements L x W x D (cm) 0.021 0.012 N/A Post Debridement Volume: (cm) N/A Category/Stage III N/A Post Debridement Stage: No Abnormalities Noted Callus: Yes N/A Periwound Skin Texture: No Abnormalities Noted No Abnormalities Noted N/A Periwound Skin Moisture: Hemosiderin Staining: Yes No Abnormalities Noted N/A Periwound Skin Color: No Abnormality N/A N/A Temperature: Yes N/A N/A Tenderness on Palpation: Compression Therapy Debridement N/A Procedures Performed: Debridement Treatment Notes Electronic Signature(s) Signed: 06/05/2022 9:49:12 AM By: Duanne Guess MD FACS Entered By: Duanne Guess on 06/05/2022 09:49:12 -------------------------------------------------------------------------------- Multi-Disciplinary Care Plan Details Patient Name: Date of Service: [name], [name] 06/05/2022 8:15 A M Medical Record Number: [id number] Patient Account Number: [id number] Date of Birth/Sex: Treating RN: [birth year] (89 y.o. [name] Primary Care Tilak Oakley: [name] Other Clinician: Referring Meerab Maselli: Treating Dim Meisinger/Extender: [name] in Treatment:  11 Multidisciplinary Care Plan reviewed with physician Active Inactive Abuse / Safety / Falls / Self Care Management Nursing Diagnoses: History of Falls Impaired physical mobility Potential for falls [place], [name] ([id number]) [id number] Page 5 of 11 Goals: Patient will not experience any injury related to falls Date Initiated: 03/20/2022 Date Inactivated: 05/29/2022 Target Resolution Date: 05/05/2022 Goal Status: Met Patient/caregiver will demonstrate safe use of adaptive devices to increase mobility Date Initiated: 03/20/2022 Date Inactivated: 04/27/2022 Target Resolution Date: 05/05/2022 Goal Status: Met Patient/caregiver will verbalize/demonstrate measures taken to prevent injury and/or falls Date Initiated: 05/29/2022 Target Resolution Date: 06/26/2022 Goal Status: Active Interventions: Assess: immobility, friction, shearing, incontinence upon admission and as needed Provide education on fall prevention Notes: Venous Leg Ulcer Nursing Diagnoses: Knowledge deficit related to disease process and management Potential for venous Insuffiency (use before diagnosis confirmed) Goals: Patient will maintain optimal edema control Date Initiated: 05/22/2022 Target Resolution Date: 06/19/2022 Goal Status: Active Interventions: Assess peripheral edema status every visit. Compression as ordered Provide education on venous insufficiency Treatment Activities: Therapeutic compression applied : 05/22/2022 Notes: Wound/Skin Impairment Nursing Diagnoses: Impaired tissue integrity Knowledge deficit related to ulceration/compromised skin integrity Goals: Patient/caregiver will verbalize understanding of skin care regimen Date Initiated: 03/20/2022 Target Resolution Date: 06/19/2022 Goal Status: Active Interventions: Assess ulceration(s) every visit Treatment Activities: Skin care regimen initiated : 03/20/2022 Topical wound management initiated :  03/20/2022 Notes: Electronic Signature(s) Signed: 06/05/2022 5:06:07 PM By: Zenaida Deed RN, BSN Entered By: Zenaida Deed on 06/05/2022 09:00:49 -------------------------------------------------------------------------------- Pain Assessment Details Patient Name: Date of Service: [name], [name] 06/05/2022 8:15 A  M Medical Record Number: [id number] Patient Account Number: [id number] Date of Birth/Sex: Treating RN: 1930-05-25 (89 y.o. [name] Primary Care Keren Alverio: [name] Other Clinician: Referring Arminda Foglio: Treating Paulla Mcclaskey/Extender: [name] in Treatment: [address] [place], [place] ([id number]) [id number] Page 6 of 11 Location of Pain Severity and Description of Pain Patient Has Paino No Site Locations Rate the pain. Current Pain Level: 0 Pain Management and Medication Current Pain Management: Electronic Signature(s) Signed: 06/05/2022 5:06:07 PM By: Zenaida Deed RN, BSN Entered By: Zenaida Deed on 06/05/2022 08:40:31 -------------------------------------------------------------------------------- Patient/Caregiver Education Details Patient Name: Date of Service: [name], [name] 5/6/2024andnbsp8:15 A M Medical Record Number: [id number] Patient Account Number: [id number] Date of Birth/Gender: Treating RN: 02/11/1930 (89 y.o. [name] Primary Care Physician: [name] Other Clinician: Referring Physician: Treating Physician/Extender: [name] in Treatment: 11 Education Assessment Education Provided To: Patient Education Topics Provided Venous: Methods: Explain/Verbal Responses: Reinforcements needed, State content correctly Wound/Skin Impairment: Methods: Explain/Verbal Responses: Reinforcements needed, State content correctly Electronic Signature(s) Signed: 06/05/2022 5:06:07 PM By: Zenaida Deed RN, BSN Entered By: Zenaida Deed  on 06/05/2022 09:01:09 -------------------------------------------------------------------------------- Wound Assessment Details Patient Name: Date of Service: [name], [name] 06/05/2022 8:15 A M Medical Record Number: [id number] Patient Account Number: [id number] Date of Birth/Sex: Treating RN: 03-05-1930 (89 y.o. [name] Primary Care Hira Trent: [name] Other Clinician: Referring [place]: Treating Khayree Delellis/Extender: [name] in Treatment: [address], [place] ([id number]) [id number] Page 7 of 11 Wound Status Wound Number: 10 Primary Venous Leg Ulcer Etiology: Wound Location: Right, Medial Lower Leg Wound Open Wounding Event: Gradually Appeared Status: Date Acquired: 05/29/2022 Comorbid Cataracts, Lymphedema, Sleep Apnea, Coronary Artery Disease, Weeks Of Treatment: 1 History: Hypertension, Hepatitis B, Osteoarthritis Clustered Wound: No Photos Wound Measurements Length: (cm) 1 Width: (cm) 1.3 Depth: (cm) 0.1 Area: (cm) 1.021 Volume: (cm) 0.102 % Reduction in Area: -8.4% % Reduction in Volume: -8.5% Epithelialization: None Tunneling: No Undermining: No Wound Description Classification: Full Thickness Without Exposed Support Wound Margin: Flat and Intact Exudate Amount: Medium Exudate Type: Serosanguineous Exudate Color: red, brown Structures Foul Odor After Cleansing: No Slough/Fibrino Yes Wound Bed Granulation Amount: None Present (0%) Exposed Structure Necrotic Amount: Large (67-100%) Fascia Exposed: No Necrotic Quality: Adherent Slough Fat Layer (Subcutaneous Tissue) Exposed: Yes Tendon Exposed: No Muscle Exposed: No Joint Exposed: No Bone Exposed: No Periwound Skin Texture Texture Color No Abnormalities Noted: Yes No Abnormalities Noted: Yes Moisture Temperature / Pain No Abnormalities Noted: Yes Temperature: No Abnormality Electronic Signature(s) Signed: 06/05/2022 5:06:07 PM By:  Zenaida Deed RN, BSN Entered By: Zenaida Deed on 06/05/2022 08:57:31 -------------------------------------------------------------------------------- Wound Assessment Details Patient Name: Date of Service: [name], [name] 06/05/2022 8:15 A M Medical Record Number: [id number] Patient Account Number: [id number] Date of Birth/Sex: Treating RN: 07/31/1930 (89 y.o. [name] Primary Care Stesha Neyens: [name] Other Clinician: Referring Brannan Cassedy: Treating Nevaeh Korte/Extender: [name] in Treatment: 11 Wound Status Wound Number: 11 Primary Venous Leg Ulcer Etiology: Wound Location: Left, Lateral Foot Wound Open Wounding Event: Gradually Appeared Status: Date Acquired: 05/29/2022 ADAIAH, JASPER ([id number]) [phone].pdf Page 8 of 11 Date Acquired: 05/29/2022 Comorbid Cataracts, Lymphedema, Sleep Apnea, Coronary Artery Disease, Weeks Of Treatment: 1 History: Hypertension, Hepatitis B, Osteoarthritis Clustered Wound: No Photos Wound Measurements Length: (cm) 0.4 Width: (cm) 0.4 Depth: (cm) 0.1 Area: (cm) 0.126 Volume: (cm) 0.013 % Reduction in Area: 64.3% % Reduction in Volume: 62.9% Epithelialization: Small (1-33%) Tunneling: No Undermining: No  Wound Description Classification: Full Thickness Without Exposed Support Structures Wound Margin: Flat and Intact Exudate Amount: None Present Foul Odor After Cleansing: No Slough/Fibrino No Wound Bed Granulation Amount: None Present (0%) Exposed Structure Necrotic Amount: Large (67-100%) Fascia Exposed: No Necrotic Quality: Eschar Fat Layer (Subcutaneous Tissue) Exposed: Yes Tendon Exposed: No Muscle Exposed: No Joint Exposed: No Bone Exposed: No Periwound Skin Texture Texture Color No Abnormalities Noted: Yes No Abnormalities Noted: Yes Moisture Temperature / Pain No Abnormalities Noted: Yes Temperature: No Abnormality Electronic Signature(s) Signed:  06/05/2022 5:06:07 PM By: Zenaida Deed RN, BSN Entered By: Zenaida Deed on 06/05/2022 08:58:04 -------------------------------------------------------------------------------- Wound Assessment Details Patient Name: Date of Service: [name], [name] 06/05/2022 8:15 A M Medical Record Number: [id number] Patient Account Number: [id number] Date of Birth/Sex: Treating RN: Mar 27, 1930 (88 y.o. [name] Primary Care Medrith Veillon: [name] Other Clinician: Referring Kiauna Zywicki: Treating Laden Fieldhouse/Extender: [name] in Treatment: 11 Wound Status Wound Number: 7 Primary Venous Leg Ulcer Etiology: Wound Location: Left, Lateral Lower Leg Secondary Lymphedema Wounding Event: Blister Etiology: Date Acquired: 05/15/2022 Wound Open Weeks Of Treatment: 2 Status: Clustered Wound: No Comorbid Cataracts, Lymphedema, Sleep Apnea, Coronary Artery History: Disease, Hypertension, Hepatitis B, Osteoarthritis Photos [place], [name] ([id number]) [id number] Page 9 of 11 Wound Measurements Length: (cm) 2 Width: (cm) 2.5 Depth: (cm) 0.1 Area: (cm) 3.927 Volume: (cm) 0.393 % Reduction in Area: 76.9% % Reduction in Volume: 76.8% Epithelialization: Medium (34-66%) Tunneling: No Undermining: No Wound Description Classification: Full Thickness Without Exposed Support Wound Margin: Flat and Intact Exudate Amount: Medium Exudate Type: Serosanguineous Exudate Color: red, brown Structures Foul Odor After Cleansing: No Slough/Fibrino Yes Wound Bed Granulation Amount: Large (67-100%) Exposed Structure Granulation Quality: Red Fascia Exposed: No Necrotic Amount: Small (1-33%) Fat Layer (Subcutaneous Tissue) Exposed: Yes Necrotic Quality: Eschar Tendon Exposed: No Muscle Exposed: No Joint Exposed: No Bone Exposed: No Periwound Skin Texture Texture Color No Abnormalities Noted: Yes No Abnormalities Noted: Yes Moisture Temperature /  Pain No Abnormalities Noted: Yes Temperature: No Abnormality Tenderness on Palpation: Yes Electronic Signature(s) Signed: 06/05/2022 5:06:07 PM By: Zenaida Deed RN, BSN Entered By: Zenaida Deed on 06/05/2022 08:58:39 -------------------------------------------------------------------------------- Wound Assessment Details Patient Name: Date of Service: [name], [name] 06/05/2022 8:15 A M Medical Record Number: [id number] Patient Account Number: [id number] Date of Birth/Sex: Treating RN: [birth year] (89 y.o. [name] Primary Care Chirstine Defrain: [name] Other Clinician: Referring Ameisha Mcclellan: Treating Jovonda Selner/Extender: [name] in Treatment: 11 Wound Status Wound Number: 8 Primary Lymphedema Etiology: Wound Location: Left, Anterior Lower Leg Secondary Venous Leg Ulcer Wounding Event: Blister Etiology: Date Acquired: 05/15/2022 Wound Open Weeks Of Treatment: 2 Status: Clustered Wound: No Comorbid Cataracts, Lymphedema, Sleep Apnea, Coronary Artery History: Disease, Hypertension, Hepatitis B, Osteoarthritis Photos [place], [name] ([id number]) [id number] Page 10 of 11 Wound Measurements Length: (cm) 0.3 Width: (cm) 0.9 Depth: (cm) 0.1 Area: (cm) 0.212 Volume: (cm) 0.021 % Reduction in Area: 90.4% % Reduction in Volume: 90.5% Epithelialization: Large (67-100%) Tunneling: No Undermining: No Wound Description Classification: Full Thickness Without Exposed Support Wound Margin: Flat and Intact Exudate Amount: Small Exudate Type: Serosanguineous Exudate Color: red, brown Structures Foul Odor After Cleansing: No Slough/Fibrino Yes Wound Bed Granulation Amount: Large (67-100%) Exposed Structure Granulation Quality: Red Fascia Exposed: No Necrotic Amount: None Present (0%) Fat Layer (Subcutaneous Tissue) Exposed: Yes Tendon Exposed: No Muscle Exposed: No Joint Exposed: No Bone Exposed: No Periwound  Skin Texture Texture Color No Abnormalities Noted: Yes No Abnormalities Noted: No Hemosiderin Staining:  Yes Moisture No Abnormalities Noted: Yes Temperature / Pain Temperature: No Abnormality Tenderness on Palpation: Yes Electronic Signature(s) Signed: 06/05/2022 5:06:07 PM By: Zenaida Deed RN, BSN Entered By: Zenaida Deed on 06/05/2022 08:59:27 -------------------------------------------------------------------------------- Wound Assessment Details Patient Name: Date of Service: [name], [name] 06/05/2022 8:15 A M Medical Record Number: [id number] Patient Account Number: [id number] Date of Birth/Sex: Treating RN: [birth year] (89 y.o. [name] Primary Care Delanda Bulluck: [name] Other Clinician: Referring Benny Henrie: Treating Levonte Molina/Extender: [name] in Treatment: 11 Wound Status Wound Number: 9 Primary Pressure Ulcer Etiology: Wound Location: Right, Medial T Second oe Wound Open Wounding Event: Gradually Appeared Status: Date Acquired: 04/30/2020 Comorbid Cataracts, Lymphedema, Sleep Apnea, Coronary Artery Disease, Weeks Of Treatment: 2 History: Hypertension, Hepatitis B, Osteoarthritis Clustered Wound: No Photos [place], [name] ([id number]) [id number] Page 11 of 11 Wound Measurements Length: (cm) 0.3 Width: (cm) 0.5 Depth: (cm) 0.1 Area: (cm) 0.118 Volume: (cm) 0.012 % Reduction in Area: -280.6% % Reduction in Volume: -300% Epithelialization: Medium (34-66%) Tunneling: No Undermining: No Wound Description Classification: Category/Stage III Wound Margin: Distinct, outline attached Exudate Amount: None Present Foul Odor After Cleansing: No Slough/Fibrino No Wound Bed Granulation Amount: Large (67-100%) Exposed Structure Granulation Quality: Pink, Pale Fascia Exposed: No Necrotic Amount: Small (1-33%) Fat Layer (Subcutaneous Tissue) Exposed: Yes Necrotic Quality: Eschar Tendon Exposed:  No Muscle Exposed: No Joint Exposed: No Bone Exposed: No Periwound Skin Texture Texture Color No Abnormalities Noted: No No Abnormalities Noted: Yes Callus: Yes Moisture No Abnormalities Noted: Yes Electronic Signature(s) Signed: 06/05/2022 5:06:07 PM By: Zenaida Deed RN, BSN Entered By: Zenaida Deed on 06/05/2022 09:00:07 -------------------------------------------------------------------------------- Vitals Details Patient Name: Date of Service: [name], [name] 06/05/2022 8:15 A M Medical Record Number: [id number] Patient Account Number: [id number] Date of Birth/Sex: Treating RN: [birth year] (89 y.o. [name] Primary Care Maciah Feeback: [name] Other Clinician: Referring Asar Evilsizer: Treating Zyasia Halbleib/Extender: [name] in Treatment: 11 Vital Signs Time Taken: 08:40 Temperature (F): 97.9 Height (in): 68 Pulse (bpm): 76 Weight (lbs): 180 Respiratory Rate (breaths/min): 22 Body Mass Index (BMI): 27.4 Blood Pressure (mmHg): 150/81 Reference Range: 80 - 120 mg / dl Electronic Signature(s) Signed: 06/05/2022 5:06:07 PM By: Zenaida Deed RN, BSN Entered By: Zenaida Deed on 06/05/2022 08:40:24

## 2022-06-06 NOTE — Progress Notes (Signed)
[name], [name] ([id number]) [id number] Page 1 of 14 Visit Report for 06/05/2022 Chief Complaint Document Details Patient Name: Date of Service: [name], [place] RO L 06/05/2022 8:15 A M Medical Record Number: [id number] Patient Account Number: [id number] Date of Birth/Sex: Treating RN: 1930/02/08 (89 y.o. F) Primary Care Provider: Romeo Rabon Other Clinician: Referring Provider: Treating Provider/Extender: [name] in Treatment: 11 Information Obtained from: Patient Chief Complaint Patient presents for treatment of open ulcers due to venous insufficiency Electronic Signature(s) Signed: 06/05/2022 9:49:21 AM By: Duanne Guess MD FACS Entered By: Duanne Guess on 06/05/2022 09:49:21 -------------------------------------------------------------------------------- Debridement Details Patient Name: Date of Service: [name], [name] 06/05/2022 8:15 A M Medical Record Number: [id number] Patient Account Number: [id number] Date of Birth/Sex: Treating RN: [birth year] (89 y.o. [name] Primary Care Provider: Romeo Rabon Other Clinician: Referring Provider: Treating Provider/Extender: [name] in Treatment: 11 Debridement Performed for Assessment: Wound #10 Right,Medial Lower Leg Performed By: Physician Duanne Guess, MD Debridement Type: Debridement Severity of Tissue Pre Debridement: Fat layer exposed Level of Consciousness (Pre-procedure): Awake and Alert Pre-procedure Verification/Time Out Yes - 09:05 Taken: Percent of Wound Bed Debrided: 100% T Area Debrided (cm): otal 1.12 Tissue and other material debrided: Non-Viable, Fat, Slough, Slough Level: Skin/Subcutaneous Tissue Debridement Description: Excisional Instrument: Curette Bleeding: Minimum Hemostasis Achieved: Pressure Procedural Pain: 0 Post Procedural Pain: 0 Response to Treatment: Procedure was tolerated well Level of  Consciousness (Post- Awake and Alert procedure): Post Debridement Measurements of Total Wound Length: (cm) 1.1 Width: (cm) 1.3 Depth: (cm) 0.1 Volume: (cm) 0.112 Character of Wound/Ulcer Post Debridement: Improved Severity of Tissue Post Debridement: Fat layer exposed Post Procedure Diagnosis Same as Pre-procedure Notes scribed for Dr. Lady Gary by Linda Deed, RN Electronic Signature(s) Signed: 06/05/2022 10:02:57 AM By: Duanne Guess MD FACS Signed: 06/05/2022 5:06:07 PM By: Linda Deed RN, BSN [place], [name] ([id number]) [id number] Page 2 of 14 Entered By: [name] on 06/05/2022 09:08:35 -------------------------------------------------------------------------------- Debridement Details Patient Name: Date of Service: [name], [place] 06/05/2022 8:15 A M Medical Record Number: [id number] Patient Account Number: [id number] Date of Birth/Sex: Treating RN: November 15, 1930 (89 y.o. [name] Primary Care Provider: Romeo Rabon Other Clinician: Referring Provider: Treating Provider/Extender: [name] in Treatment: 11 Debridement Performed for Assessment: Wound #8 Left,Anterior Lower Leg Performed By: Physician Duanne Guess, MD Debridement Type: Debridement Severity of Tissue Pre Debridement: Fat layer exposed Level of Consciousness (Pre-procedure): Awake and Alert Pre-procedure Verification/Time Out Yes - 09:05 Taken: Pain Control: Lidocaine 4% Topical Solution Percent of Wound Bed Debrided: 100% T Area Debrided (cm): otal 0.21 Tissue and other material debrided: Non-Viable, Eschar Level: Non-Viable Tissue Debridement Description: Selective/Open Wound Instrument: Curette Bleeding: Minimum Hemostasis Achieved: Pressure Procedural Pain: 0 Post Procedural Pain: 0 Response to Treatment: Procedure was tolerated well Level of Consciousness (Post- Awake and Alert procedure): Post Debridement  Measurements of Total Wound Length: (cm) 0.3 Width: (cm) 0.9 Depth: (cm) 0.1 Volume: (cm) 0.021 Character of Wound/Ulcer Post Debridement: Improved Severity of Tissue Post Debridement: Fat layer exposed Post Procedure Diagnosis Same as Pre-procedure Notes scribed for Dr. Lady Gary by Linda Deed, RN Electronic Signature(s) Signed: 06/05/2022 10:02:57 AM By: Duanne Guess MD FACS Signed: 06/05/2022 5:06:07 PM By: Linda Deed RN, BSN Entered By: [name] on 06/05/2022 09:09:09 -------------------------------------------------------------------------------- Debridement Details Patient Name: Date of Service: [name], [name] 06/05/2022 8:15 A M Medical Record Number: [id number] Patient Account Number: [id number] Date of Birth/Sex: Treating RN: 1930-10-06 (89 y.o. F)  [name] Primary Care Provider: Romeo Rabon Other Clinician: Referring Provider: Treating Provider/Extender: [name] in Treatment: 11 Debridement Performed for Assessment: Wound #7 Left,Lateral Lower Leg Performed By: Physician Duanne Guess, MD Debridement Type: Debridement Severity of Tissue Pre Debridement: Fat layer exposed Level of Consciousness (Pre-procedure): Awake and Alert Pre-procedure Verification/Time Out Yes - 09:05 Taken: Pain Control: Lidocaine 4% Topical Solution Percent of Wound Bed Debrided: 100% T Area Debrided (cm): otal 3.92 [name], Linda ([id number]) [id number] Page 3 of 14 Tissue and other material debrided: Non-Viable, Eschar Level: Non-Viable Tissue Debridement Description: Selective/Open Wound Instrument: Curette Bleeding: Minimum Hemostasis Achieved: Pressure Procedural Pain: 0 Post Procedural Pain: 0 Response to Treatment: Procedure was tolerated well Level of Consciousness (Post- Awake and Alert procedure): Post Debridement Measurements of Total Wound Length: (cm) 2 Width: (cm) 2.5 Depth: (cm)  0.1 Volume: (cm) 0.393 Character of Wound/Ulcer Post Debridement: Improved Severity of Tissue Post Debridement: Fat layer exposed Post Procedure Diagnosis Same as Pre-procedure Notes scribed for Dr. Lady Gary by Linda Deed, RN Electronic Signature(s) Signed: 06/05/2022 10:02:57 AM By: Duanne Guess MD FACS Signed: 06/05/2022 5:06:07 PM By: Linda Deed RN, BSN Entered By: [name] on 06/05/2022 09:09:55 -------------------------------------------------------------------------------- Debridement Details Patient Name: Date of Service: [name], [name] 06/05/2022 8:15 A M Medical Record Number: [id number] Patient Account Number: [id number] Date of Birth/Sex: Treating RN: 1930-02-03 (89 y.o. [name] Primary Care Provider: Romeo Rabon Other Clinician: Referring Provider: Treating Provider/Extender: [name] in Treatment: 11 Debridement Performed for Assessment: Wound #11 Left,Lateral Foot Performed By: Physician Duanne Guess, MD Debridement Type: Debridement Severity of Tissue Pre Debridement: Fat layer exposed Level of Consciousness (Pre-procedure): Awake and Alert Pre-procedure Verification/Time Out Yes - 09:05 Taken: Pain Control: Lidocaine 4% Topical Solution Percent of Wound Bed Debrided: 100% T Area Debrided (cm): otal 0.13 Tissue and other material debrided: Non-Viable, Eschar, Slough, Slough Level: Non-Viable Tissue Debridement Description: Selective/Open Wound Instrument: Curette Bleeding: Minimum Hemostasis Achieved: Pressure Procedural Pain: 0 Post Procedural Pain: 0 Response to Treatment: Procedure was tolerated well Level of Consciousness (Post- Awake and Alert procedure): Post Debridement Measurements of Total Wound Length: (cm) 0.4 Width: (cm) 0.4 Depth: (cm) 0.1 Volume: (cm) 0.013 Character of Wound/Ulcer Post Debridement: Improved Severity of Tissue Post Debridement: Fat layer exposed Post  Procedure Diagnosis Same as Zigmund Daniel, [name] ([id number]) [id number] Page 4 of 14 Notes scribed for Dr. Lady Gary by Linda Deed, RN Electronic Signature(s) Signed: 06/05/2022 10:02:57 AM By: Duanne Guess MD FACS Signed: 06/05/2022 5:06:07 PM By: Linda Deed RN, BSN Entered By: [name] on 06/05/2022 09:10:40 -------------------------------------------------------------------------------- Debridement Details Patient Name: Date of Service: [name], [name] 06/05/2022 8:15 A M Medical Record Number: [id number] Patient Account Number: [id number] Date of Birth/Sex: Treating RN: 1930-11-21 (89 y.o. [name] Primary Care Provider: Romeo Rabon Other Clinician: Referring Provider: Treating Provider/Extender: [name] in Treatment: 11 Debridement Performed for Assessment: Wound #9 Right,Medial T Second oe Performed By: Physician Duanne Guess, MD Debridement Type: Debridement Level of Consciousness (Pre-procedure): Awake and Alert Pre-procedure Verification/Time Out Yes - 09:05 Taken: Pain Control: Lidocaine 4% Topical Solution Percent of Wound Bed Debrided: 100% T Area Debrided (cm): otal 0.12 Tissue and other material debrided: Non-Viable, Callus, Slough, Skin: Epidermis, Slough Level: Skin/Epidermis Debridement Description: Selective/Open Wound Instrument: Curette Bleeding: Minimum Hemostasis Achieved: Pressure Procedural Pain: 0 Post Procedural Pain: 0 Response to Treatment: Procedure was tolerated well Level of Consciousness (Post- Awake and Alert procedure): Post Debridement Measurements  of Total Wound Length: (cm) 0.3 Stage: Category/Stage III Width: (cm) 0.5 Depth: (cm) 0.1 Volume: (cm) 0.012 Character of Wound/Ulcer Post Debridement: Improved Post Procedure Diagnosis Same as Pre-procedure Notes scribed for Dr. Lady Gary by Linda Deed, RN Electronic  Signature(s) Signed: 06/05/2022 10:02:57 AM By: Duanne Guess MD FACS Signed: 06/05/2022 5:06:07 PM By: Linda Deed RN, BSN Entered By: [name] on 06/05/2022 09:12:55 -------------------------------------------------------------------------------- HPI Details Patient Name: Date of Service: [name], [name] 06/05/2022 8:15 A M Medical Record Number: [id number] Patient Account Number: [id number] Date of Birth/Sex: Treating RN: 21-Nov-1930 (89 y.o. F) Primary Care Provider: Romeo Rabon Other Clinician: Referring Provider: Treating Provider/Extender: [name] in Treatment: 11 History of Present Illness [place], [place] ([id number]) [id number] Page 5 of 14 HPI Description: ADMISSION 03/20/2022 This is an 89 year old woman with coronary artery disease, on chronic corticosteroids for osteoarthritis, who presents with bilateral lower leg ulcers. They have been present for a couple of weeks. I do not have any vascular studies available to discern whether or not she has had venous reflux or arterial studies done. ABIs in clinic were 1.34 on the left and 0.95 on the right. She is not diabetic. She does not smoke. Her legs are quite edematous and she has multiple hematomas of various sizes and ages extending up to at least the distal thigh. She has 3 ulcers on the left leg with slough and eschar accumulation. On the right leg, she has a tiny superficial ulcer on the anterior tibial surface. She also has a small ulcer on the lateral aspect of her right third toe. 03/27/2022: The ulcer on her right anterior tibial surface and lateral third toe are both healed. The ulcers on her left leg are smaller but have slough accumulation. Edema control is good. Her juxta lite stockings arrived in the mail, but she did not bring them with her today. 04/06/2022: She has a new skin tear just distal to her left knee. The ulcer on her left upper posterior  calf is nearly healed under a layer of eschar. She has a spot on her right second toe that she is concerned about, saying that she frequently gets an ulcer there, but I do not see any open wound in this site. 04/21/2022: All of her wounds are healed except for the skin tear distal to her left knee. There is a layer of eschar on the surface. Underneath, the wound is quite superficial and clean. 04/19/2022: All of her wounds are healed. She is wearing a juxta lite stocking on her right leg and has brought her other stocking for [place] to apply to her left. 05/22/2022: She returns today with new ulcers on her left leg. A large hematoma/blood blister on her upper lateral leg, just distal to the knee opened up shortly after she was healed out from our clinic. There is slough on the wound surface, but no concern for infection. She has a second ulcer on her anterior tibial surface with slough on the surface. The small site on her right second toe that we have been monitoring has opened. There is some slough in the center with surrounding callus. She has a hematoma/blood blister on the medial aspect of her right lower leg that is not open, but it is certainly threatening to do so. 05/29/2022: The hematoma that had threatened to open last week is now open. There is slough and hematoma and nonviable subcutaneous tissue present. The toe ulcer is down to a small pinhole, but there  is some undermining from some dry skin present. The ulcers on her left leg are both smaller. There is some slough accumulation on both. 06/05/2022: The more proximal of the left leg ulcers is closed by about 50%. There is some eschar on the wound surface. The anterior left leg ulcer is nearly healed with just a little eschar on the remaining open portion. The lateral foot ulcer has some eschar and slough present the most recent right lower leg wound has slough and fat exposed. The right second toe ulcer looks about the same with a layer of callus  and slough. Electronic Signature(s) Signed: 06/05/2022 9:50:52 AM By: Duanne Guess MD FACS Entered By: Duanne Guess on 06/05/2022 09:50:52 -------------------------------------------------------------------------------- Paring/cutting 1 benign hyperkeratotic lesion Details Patient Name: Date of Service: Darrick Meigs 06/05/2022 8:15 A M Medical Record Number: [id number] Patient Account Number: [id number] Date of Birth/Sex: Treating RN: [birth year] (89 y.o. [name] Primary Care Provider: Romeo Rabon Other Clinician: Referring Provider: Treating Provider/Extender: [name] in Treatment: 11 Procedure Performed for: Non-Wound Location Performed By: Physician Duanne Guess, MD Post Procedure Diagnosis Same as Pre-procedure Notes right plantar foot using curette Electronic Signature(s) Signed: 06/05/2022 10:02:57 AM By: Duanne Guess MD FACS Signed: 06/05/2022 5:06:07 PM By: Linda Deed RN, BSN Entered By: [name] on 06/05/2022 09:11:15 -------------------------------------------------------------------------------- Physical Exam Details Patient Name: Date of Service: [name], [name] 06/05/2022 8:15 A M Medical Record Number: [id number] Patient Account Number: [id number] Date of Birth/Sex: Treating RN: 09-30-1930 (89 y.o. F) Primary Care Provider: Romeo Rabon Other Clinician: Referring Provider: Treating Provider/Extender: [name] in Treatment: 11 Constitutional Hypertensive, asymptomatic. . . . no acute distress. [place], [name] ([id number]) [id number] Page 6 of 14 Respiratory Normal work of breathing on room air. Notes 06/05/2022: The more proximal of the left leg ulcers is closed by about 50%. There is some eschar on the wound surface. The anterior left leg ulcer is nearly healed with just a little eschar on the remaining open portion. The lateral foot  ulcer has some eschar and slough present the most recent right lower leg wound has slough and fat exposed. The right second toe ulcer looks about the same with a layer of callus and slough. Electronic Signature(s) Signed: 06/05/2022 9:51:19 AM By: Duanne Guess MD FACS Entered By: Duanne Guess on 06/05/2022 09:51:19 -------------------------------------------------------------------------------- Physician Orders Details Patient Name: Date of Service: [name], [name] 06/05/2022 8:15 A M Medical Record Number: [id number] Patient Account Number: [id number] Date of Birth/Sex: Treating RN: 04-15-1930 (89 y.o. [name] Primary Care Provider: Romeo Rabon Other Clinician: Referring Provider: Treating Provider/Extender: [name] in Treatment: 11 Verbal / Phone Orders: No Diagnosis Coding ICD-10 Coding Code Description [phone] Non-pressure chronic ulcer of other part of left lower leg with fat layer exposed L97.512 Non-pressure chronic ulcer of other part of right foot with fat layer exposed L97.812 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ed R60.0 Localized edema I87.2 Venous insufficiency (chronic) (peripheral) Z79.52 Long term (current) use of systemic steroids Follow-up Appointments ppointment in 1 week. - Dr. Lady Gary - Room 1 Return A Monday 5/13 @ 10:30 am Anesthetic Wound #7 Left,Lateral Lower Leg (In clinic) Topical Lidocaine 4% applied to wound bed Wound #8 Left,Anterior Lower Leg (In clinic) Topical Lidocaine 4% applied to wound bed Wound #9 Right,Medial T Second oe (In clinic) Topical Lidocaine 4% applied to wound bed Bathing/ Shower/ Hygiene  May shower with protection but do not get wound dressing(s) wet. Protect dressing(s) with water repellant cover (for example, large plastic bag) or a cast cover and may then take shower. Edema Control - Lymphedema / SCD / Other Elevate legs to the  level of the heart or above for 30 minutes daily and/or when sitting for 3-4 times a day throughout the day. Avoid standing for long periods of time. Exercise regularly Wound Treatment Wound #10 - Lower Leg Wound Laterality: Right, Medial Peri-Wound Care: Sween Lotion (Moisturizing lotion) 1 x Per Week Discharge Instructions: Apply moisturizing lotion as directed Prim Dressing: Maxorb Extra Ag+ Alginate Dressing, 4x4.75 (in/in) 1 x Per Week ary Discharge Instructions: Apply to wound bed as instructed Secondary Dressing: Woven Gauze Sponge, Non-Sterile 4x4 in 1 x Per Week Discharge Instructions: Apply over primary dressing as directed. Compression Wrap: Urgo K2 Lite, two layer compression system, regular 1 x Per Week Discharge Instructions: Apply Urgo K2 Lite as directed (alternative to 3 layer compression). [place], [name] ([id number]) [id number] Page 7 of 14 Wound #11 - Foot Wound Laterality: Left, Lateral Peri-Wound Care: Sween Lotion (Moisturizing lotion) 1 x Per Week Discharge Instructions: Apply moisturizing lotion as directed Prim Dressing: Maxorb Extra Ag+ Alginate Dressing, 4x4.75 (in/in) 1 x Per Week ary Discharge Instructions: Apply to wound bed as instructed Secondary Dressing: Optifoam Non-Adhesive Dressing, 4x4 in 1 x Per Week Discharge Instructions: Apply over primary dressing cut to make foam donut Secondary Dressing: Woven Gauze Sponge, Non-Sterile 4x4 in 1 x Per Week Discharge Instructions: Apply over primary dressing as directed. Compression Wrap: Urgo K2 Lite, two layer compression system, regular 1 x Per Week Discharge Instructions: Apply Urgo K2 Lite as directed (alternative to 3 layer compression). Wound #7 - Lower Leg Wound Laterality: Left, Lateral Peri-Wound Care: Sween Lotion (Moisturizing lotion) 1 x Per Week Discharge Instructions: Apply moisturizing lotion as directed Prim Dressing: Maxorb Extra Ag+ Alginate Dressing, 4x4.75  (in/in) 1 x Per Week ary Discharge Instructions: Apply to wound bed as instructed Secondary Dressing: ABD Pad, 8x10 1 x Per Week Discharge Instructions: Apply over primary dressing as directed. Compression Wrap: Urgo K2 Lite, two layer compression system, regular 1 x Per Week Discharge Instructions: Apply Urgo K2 Lite as directed (alternative to 3 layer compression). Wound #8 - Lower Leg Wound Laterality: Left, Anterior Peri-Wound Care: Sween Lotion (Moisturizing lotion) 1 x Per Week Discharge Instructions: Apply moisturizing lotion as directed Prim Dressing: Maxorb Extra Ag+ Alginate Dressing, 4x4.75 (in/in) 1 x Per Week ary Discharge Instructions: Apply to wound bed as instructed Secondary Dressing: ABD Pad, 8x10 1 x Per Week Discharge Instructions: Apply over primary dressing as directed. Compression Wrap: Urgo K2 Lite, two layer compression system, regular 1 x Per Week Discharge Instructions: Apply Urgo K2 Lite as directed (alternative to 3 layer compression). Wound #9 - T Second oe Wound Laterality: Right, Medial Prim Dressing: Maxorb Extra Ag+ Alginate Dressing, 2x2 (in/in) Every Other Day/30 Days ary Discharge Instructions: Apply to wound bed as instructed Secondary Dressing: Woven Gauze Sponges 2x2 in Every Other Day/30 Days Discharge Instructions: Apply over primary dressing as directed. Secured With: Insurance underwriter, Sterile 2x75 (in/in) Every Other Day/30 Days Discharge Instructions: Secure with stretch gauze as directed. Electronic Signature(s) Signed: 06/05/2022 10:02:57 AM By: Duanne Guess MD FACS Entered By: Duanne Guess on 06/05/2022 09:51:35 -------------------------------------------------------------------------------- Problem List Details Patient Name: Date of Service: [name], [name] 06/05/2022 8:15 A M Medical Record Number: [id number] Patient Account Number: [id number] Date of Birth/Sex: Treating  RN: 1930/05/03 (89 y.o. [name] Primary Care Provider: Romeo Rabon Other Clinician: Referring Provider: Treating Provider/Extender: [name] in Treatment: [address] Active Problems ICD-10 Encounter [place], [name] ([id number]) [id number] Page 8 of 14 Encounter Code Description Active Date MDM Diagnosis [phone] Non-pressure chronic ulcer of other part of left lower leg with fat layer exposed4/22/2024 No Yes L97.512 Non-pressure chronic ulcer of other part of right foot with fat layer exposed 05/22/2022 No Yes L97.812 Non-pressure chronic ulcer of other part of right lower leg with fat layer 05/29/2022 No Yes exposed R60.0 Localized edema 03/20/2022 No Yes I87.2 Venous insufficiency (chronic) (peripheral) 03/20/2022 No Yes Z79.52 Long term (current) use of systemic steroids 03/20/2022 No Yes L84 Corns and callosities 06/05/2022 No Yes Inactive Problems Resolved Problems ICD-10 Code Description Active Date Resolved Date L97.811 Non-pressure chronic ulcer of other part of right lower leg limited to breakdown of skin 03/20/2022 03/20/2022 L97.511 Non-pressure chronic ulcer of other part of right foot limited to breakdown of skin 03/20/2022 03/20/2022 Electronic Signature(s) Signed: 06/05/2022 10:03:30 AM By: Duanne Guess MD FACS Previous Signature: 06/05/2022 9:49:03 AM Version By: Duanne Guess MD FACS Entered By: Duanne Guess on 06/05/2022 10:03:29 -------------------------------------------------------------------------------- Progress Note Details Patient Name: Date of Service: [name], [name] 06/05/2022 8:15 A M Medical Record Number: [id number] Patient Account Number: [id number] Date of Birth/Sex: Treating RN: 1930/03/06 (89 y.o. F) Primary Care Provider: Romeo Rabon Other Clinician: Referring Provider: Treating Provider/Extender: [name] in Treatment: 11 Subjective Chief Complaint Information obtained from  Patient Patient presents for treatment of open ulcers due to venous insufficiency History of Present Illness (HPI) ADMISSION 03/20/2022 This is an 89 year old woman with coronary artery disease, on chronic corticosteroids for osteoarthritis, who presents with bilateral lower leg ulcers. They have been present for a couple of weeks. I do not have any vascular studies available to discern whether or not she has had venous reflux or arterial studies done. ABIs in clinic were 1.34 on the left and 0.95 on the right. She is not diabetic. She does not smoke. Her legs are quite edematous and she has multiple hematomas of various sizes and ages extending up to at least the distal thigh. She has 3 ulcers on the left [place], Loriann ([id number]) [id number] Page 9 of 14 leg with slough and eschar accumulation. On the right leg, she has a tiny superficial ulcer on the anterior tibial surface. She also has a small ulcer on the lateral aspect of her right third toe. 03/27/2022: The ulcer on her right anterior tibial surface and lateral third toe are both healed. The ulcers on her left leg are smaller but have slough accumulation. Edema control is good. Her juxta lite stockings arrived in the mail, but she did not bring them with her today. 04/06/2022: She has a new skin tear just distal to her left knee. The ulcer on her left upper posterior calf is nearly healed under a layer of eschar. She has a spot on her right second toe that she is concerned about, saying that she frequently gets an ulcer there, but I do not see any open wound in this site. 04/21/2022: All of her wounds are healed except for the skin tear distal to her left knee. There is a layer of eschar on the surface. Underneath, the wound is quite superficial and clean. 04/19/2022: All of her wounds are healed. She is wearing a juxta lite stocking on her right leg and has brought her other  stocking for [place] to apply to her  left. 05/22/2022: She returns today with new ulcers on her left leg. A large hematoma/blood blister on her upper lateral leg, just distal to the knee opened up shortly after she was healed out from our clinic. There is slough on the wound surface, but no concern for infection. She has a second ulcer on her anterior tibial surface with slough on the surface. The small site on her right second toe that we have been monitoring has opened. There is some slough in the center with surrounding callus. She has a hematoma/blood blister on the medial aspect of her right lower leg that is not open, but it is certainly threatening to do so. 05/29/2022: The hematoma that had threatened to open last week is now open. There is slough and hematoma and nonviable subcutaneous tissue present. The toe ulcer is down to a small pinhole, but there is some undermining from some dry skin present. The ulcers on her left leg are both smaller. There is some slough accumulation on both. 06/05/2022: The more proximal of the left leg ulcers is closed by about 50%. There is some eschar on the wound surface. The anterior left leg ulcer is nearly healed with just a little eschar on the remaining open portion. The lateral foot ulcer has some eschar and slough present the most recent right lower leg wound has slough and fat exposed. The right second toe ulcer looks about the same with a layer of callus and slough. Patient History Information obtained from Patient. Family History Heart Disease - Father, Hypertension - Mother, No family history of Cancer, Diabetes, Hereditary Spherocytosis, Kidney Disease, Lung Disease, Seizures, Stroke, Thyroid Problems, Tuberculosis. Social History Never smoker, Marital Status - Married, Alcohol Use - Rarely, Drug Use - No History, Caffeine Use - Moderate. 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 - cardiac catheterization. - cataract extraction bilat.. - colonoscopy. - bilateral total knee replacement. - upper GI endoscopy. Medical A Surgical History Notes nd Gastrointestinal diverticulosis, GERD Musculoskeletal osteoporosis Objective Constitutional Hypertensive, asymptomatic. no acute distress. Vitals Time Taken: 8:40 AM, Height: 68 in, Weight: 180 lbs, BMI: 27.4, Temperature: 97.9 F, Pulse: 76 bpm, Respiratory Rate: 22 breaths/min, Blood Pressure: 150/81 mmHg. Respiratory Normal work of breathing on room air. General Notes: 06/05/2022: The more proximal of the left leg ulcers is closed by about 50%. There is some eschar on the wound surface. The anterior left leg ulcer is nearly healed with just a little eschar on the remaining open portion. The lateral foot ulcer has some eschar and slough present the most recent right lower leg wound has slough and fat exposed. The right second toe ulcer looks about the same with a layer of callus and slough. Integumentary (Hair, Skin) Wound #10 status is Open. Original cause of wound was Gradually Appeared. The date acquired was: 05/29/2022. The wound has been in treatment 1 weeks. The wound is located on the Right,Medial Lower Leg. The wound measures 1cm length x 1.3cm width x 0.1cm depth; 1.021cm^2 area and 0.102cm^3 volume. There is Fat Layer (Subcutaneous Tissue) exposed. There is no tunneling or undermining noted. There is a medium amount of serosanguineous drainage noted. [place], [name] ([id number]) [id number] Page 10 of 14  The wound margin is flat and intact. There is no granulation within the wound bed. There is a large (67-100%) amount of  necrotic tissue within the wound bed including Adherent Slough. The periwound skin appearance had no abnormalities noted for texture. The periwound skin appearance had no abnormalities noted for moisture. The periwound skin appearance had no abnormalities noted for color. Periwound temperature was noted as No Abnormality. Wound #11 status is Open. Original cause of wound was Gradually Appeared. The date acquired was: 05/29/2022. The wound has been in treatment 1 weeks. The wound is located on the Left,Lateral Foot. The wound measures 0.4cm length x 0.4cm width x 0.1cm depth; 0.126cm^2 area and 0.013cm^3 volume. There is Fat Layer (Subcutaneous Tissue) exposed. There is no tunneling or undermining noted. There is a none present amount of drainage noted. The wound margin is flat and intact. There is no granulation within the wound bed. There is a large (67-100%) amount of necrotic tissue within the wound bed including Eschar. The periwound skin appearance had no abnormalities noted for texture. The periwound skin appearance had no abnormalities noted for moisture. The periwound skin appearance had no abnormalities noted for color. Periwound temperature was noted as No Abnormality. Wound #7 status is Open. Original cause of wound was Blister. The date acquired was: 05/15/2022. The wound has been in treatment 2 weeks. The wound is located on the Left,Lateral Lower Leg. The wound measures 2cm length x 2.5cm width x 0.1cm depth; 3.927cm^2 area and 0.393cm^3 volume. There is Fat Layer (Subcutaneous Tissue) exposed. There is no tunneling or undermining noted. There is a medium amount of serosanguineous drainage noted. The wound margin is flat and intact. There is large (67-100%) red granulation within the wound bed. There is a small (1-33%) amount of necrotic tissue within the wound bed including Eschar. The periwound skin appearance had no abnormalities noted for texture. The periwound skin appearance had no  abnormalities noted for moisture. The periwound skin appearance had no abnormalities noted for color. Periwound temperature was noted as No Abnormality. The periwound has tenderness on palpation. Wound #8 status is Open. Original cause of wound was Blister. The date acquired was: 05/15/2022. The wound has been in treatment 2 weeks. The wound is located on the Left,Anterior Lower Leg. The wound measures 0.3cm length x 0.9cm width x 0.1cm depth; 0.212cm^2 area and 0.021cm^3 volume. There is Fat Layer (Subcutaneous Tissue) exposed. There is no tunneling or undermining noted. There is a small amount of serosanguineous drainage noted. The wound margin is flat and intact. There is large (67-100%) red granulation within the wound bed. There is no necrotic tissue within the wound bed. The periwound skin appearance had no abnormalities noted for texture. The periwound skin appearance had no abnormalities noted for moisture. The periwound skin appearance exhibited: Hemosiderin Staining. Periwound temperature was noted as No Abnormality. The periwound has tenderness on palpation. Wound #9 status is Open. Original cause of wound was Gradually Appeared. The date acquired was: 04/30/2020. The wound has been in treatment 2 weeks. The wound is located on the Right,Medial T Second. The wound measures 0.3cm length x 0.5cm width x 0.1cm depth; 0.118cm^2 area and 0.012cm^3 volume. oe There is Fat Layer (Subcutaneous Tissue) exposed. There is no tunneling or undermining noted. There is a none present amount of drainage noted. The wound margin is distinct with the outline attached to the wound base. There is large (67-100%) pink, pale granulation within the wound bed. There is a small (1-33%) amount of necrotic  tissue within the wound bed including Eschar. The periwound skin appearance had no abnormalities noted for moisture. The periwound skin appearance had no abnormalities noted for color. The periwound skin appearance  exhibited: Callus. 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 There was a Excisional Skin/Subcutaneous Tissue Debridement with a total area of 1.12 sq cm performed by Duanne Guess, MD. With the following instrument(s): Curette to remove Non-Viable tissue/material. Material removed includes Fat and Slough and. No specimens were taken. A time out was conducted at 09:05, prior to the start of the procedure. A Minimum amount of bleeding was controlled with Pressure. The procedure was tolerated well with a pain level of 0 throughout and a pain level of 0 following the procedure. Post Debridement Measurements: 1.1cm length x 1.3cm width x 0.1cm depth; 0.112cm^3 volume. Character of Wound/Ulcer Post Debridement is improved. Severity of Tissue Post Debridement is: Fat layer exposed. Post procedure Diagnosis Wound #10: Same as Pre-Procedure General Notes: scribed for Dr. Lady Gary by Linda Deed, RN. 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 There was a Double Layer Compression Therapy Procedure by Linda Deed, RN. Post procedure Diagnosis Wound #10: Same as Pre-Procedure Notes: urgo lites. Wound #11 Pre-procedure diagnosis of Wound #11 is a Venous Leg Ulcer located on the Left,Lateral Foot .Severity of Tissue Pre Debridement is: Fat layer exposed. There was a Selective/Open Wound Non-Viable Tissue  Debridement with a total area of 0.13 sq cm performed by Duanne Guess, MD. With the following instrument(s): Curette to remove Non-Viable tissue/material. Material removed includes Eschar and Slough and after achieving pain control using Lidocaine 4% Topical Solution. No specimens were taken. A time out was conducted at 09:05, prior to the start of the procedure. A Minimum amount of bleeding was controlled with Pressure. The procedure was tolerated well with a pain level of 0 throughout and a pain level of 0 following the procedure. Post Debridement Measurements: 0.4cm length x 0.4cm width x 0.1cm depth; 0.013cm^3 volume. Character of Wound/Ulcer Post Debridement is improved. Severity of Tissue Post Debridement is: Fat layer exposed. Post procedure Diagnosis Wound #11: Same as Pre-Procedure General Notes: scribed for Dr. Lady Gary by Linda Deed, RN. Wound #7 Pre-procedure diagnosis of Wound #7 is a Venous Leg Ulcer located on the Left,Lateral Lower Leg .Severity of Tissue Pre Debridement is: Fat layer exposed. There was a Selective/Open Wound Non-Viable Tissue Debridement with a total area of 3.92 sq cm performed by Duanne Guess, MD. With the following instrument(s): Curette to remove Non-Viable tissue/material. Material removed includes Eschar after achieving pain control using Lidocaine 4% Topical Solution. No specimens were taken. A time out was conducted at 09:05, prior to the start of the procedure. A Minimum amount of bleeding was controlled with Pressure. The procedure was tolerated well with a pain level of 0 throughout and a pain level of 0 following the procedure. Post Debridement Measurements: 2cm length x 2.5cm width x 0.1cm depth; 0.393cm^3 volume. [place], [name] ([id number]) [id number] Page 11 of  14 Character of Wound/Ulcer Post Debridement is improved. Severity of Tissue Post Debridement is: Fat layer exposed. Post procedure Diagnosis Wound #7:  Same as Pre-Procedure General Notes: scribed for Dr. Lady Gary by Linda Deed, RN. Wound #8 Pre-procedure diagnosis of Wound #8 is a Lymphedema located on the Left,Anterior Lower Leg .Severity of Tissue Pre Debridement is: Fat layer exposed. There was a Selective/Open Wound Non-Viable Tissue Debridement with a total area of 0.21 sq cm performed by Duanne Guess, MD. With the following instrument(s): Curette to remove Non-Viable tissue/material. Material removed includes Eschar after achieving pain control using Lidocaine 4% Topical Solution. No specimens were taken. A time out was conducted at 09:05, prior to the start of the procedure. A Minimum amount of bleeding was controlled with Pressure. The procedure was tolerated well with a pain level of 0 throughout and a pain level of 0 following the procedure. Post Debridement Measurements: 0.3cm length x 0.9cm width x 0.1cm depth; 0.021cm^3 volume. Character of Wound/Ulcer Post Debridement is improved. Severity of Tissue Post Debridement is: Fat layer exposed. Post procedure Diagnosis Wound #8: Same as Pre-Procedure General Notes: scribed for Dr. Lady Gary by Linda Deed, RN. Pre-procedure diagnosis of Wound #8 is a Lymphedema located on the Left,Anterior Lower Leg . There was a Double Layer Compression Therapy Procedure by Linda Deed, RN. Post procedure Diagnosis Wound #8: Same as Pre-Procedure Notes: urgo lites. Wound #9 Pre-procedure diagnosis of Wound #9 is a Pressure Ulcer located on the Right,Medial T Second . There was a Selective/Open Wound Skin/Epidermis oe Debridement with a total area of 0.12 sq cm performed by Duanne Guess, MD. With the following instrument(s): Curette to remove Non-Viable tissue/material. Material removed includes Callus, Slough, and Skin: Epidermis after achieving pain control using Lidocaine 4% Topical Solution. No specimens were taken. A time out was conducted at 09:05, prior to the start of the  procedure. A Minimum amount of bleeding was controlled with Pressure. The procedure was tolerated well with a pain level of 0 throughout and a pain level of 0 following the procedure. Post Debridement Measurements: 0.3cm length x 0.5cm width x 0.1cm depth; 0.012cm^3 volume. Post debridement Stage noted as Category/Stage III. Character of Wound/Ulcer Post Debridement is improved. Post procedure Diagnosis Wound #9: Same as Pre-Procedure General Notes: scribed for Dr. Lady Gary by Linda Deed, RN. A Paring/cutting 1 benign hyperkeratotic lesion procedure was performed. by Duanne Guess, MD. Post procedure Diagnosis Wound #: Same as Pre-Procedure Notes: right plantar foot using curette Plan Follow-up Appointments: Return Appointment in 1 week. - Dr. Lady Gary - Room 1 Monday 5/13 @ 10:30 am Anesthetic: Wound #7 Left,Lateral Lower Leg: (In clinic) Topical Lidocaine 4% applied to wound bed Wound #8 Left,Anterior Lower Leg: (In clinic) Topical Lidocaine 4% applied to wound bed Wound #9 Right,Medial T Second: oe (In clinic) Topical Lidocaine 4% applied to wound bed Bathing/ Shower/ Hygiene: May shower with protection but do not get wound dressing(s) wet. Protect dressing(s) with water repellant cover (for example, large plastic bag) or a cast cover and may then take shower. Edema Control - Lymphedema / SCD / Other: Elevate legs to the level of the heart or above for 30 minutes daily and/or when sitting for 3-4 times a day throughout the day. Avoid standing for long periods of time. Exercise regularly WOUND #10: - Lower Leg Wound Laterality: Right, Medial Peri-Wound Care: Sween Lotion (Moisturizing lotion) 1 x Per Week/ Discharge Instructions: Apply moisturizing lotion as directed Prim Dressing: Maxorb Extra Ag+ Alginate Dressing, 4x4.75 (in/in) 1 x Per  Week/ ary Discharge Instructions: Apply to wound bed as instructed Secondary Dressing: Woven Gauze Sponge, Non-Sterile 4x4 in 1 x Per  Week/ Discharge Instructions: Apply over primary dressing as directed. Com pression Wrap: Urgo K2 Lite, two layer compression system, regular 1 x Per Week/ Discharge Instructions: Apply Urgo K2 Lite as directed (alternative to 3 layer compression). WOUND #11: - Foot Wound Laterality: Left, Lateral Peri-Wound Care: Sween Lotion (Moisturizing lotion) 1 x Per Week/ Discharge Instructions: Apply moisturizing lotion as directed Prim Dressing: Maxorb Extra Ag+ Alginate Dressing, 4x4.75 (in/in) 1 x Per Week/ ary Discharge Instructions: Apply to wound bed as instructed Secondary Dressing: Optifoam Non-Adhesive Dressing, 4x4 in 1 x Per Week/ Discharge Instructions: Apply over primary dressing cut to make foam donut Secondary Dressing: Woven Gauze Sponge, Non-Sterile 4x4 in 1 x Per Week/ Discharge Instructions: Apply over primary dressing as directed. Com pression Wrap: Urgo K2 Lite, two layer compression system, regular 1 x Per Week/ Discharge Instructions: Apply Urgo K2 Lite as directed (alternative to 3 layer compression). WOUND #7: - Lower Leg Wound Laterality: Left, Lateral Peri-Wound Care: Sween Lotion (Moisturizing lotion) 1 x Per Week/ Discharge Instructions: Apply moisturizing lotion as directed Prim Dressing: Maxorb Extra Ag+ Alginate Dressing, 4x4.75 (in/in) 1 x Per Week/ ary Discharge Instructions: Apply to wound bed as instructed Secondary Dressing: ABD Pad, 8x10 1 x Per Week/ Discharge Instructions: Apply over primary dressing as directed. Com pression Wrap: Urgo K2 Lite, two layer compression system, regular 1 x Per Week/ Discharge Instructions: Apply Urgo K2 Lite as directed (alternative to 3 layer compression). WOUND #8: - Lower Leg Wound Laterality: Left, Anterior Peri-Wound Care: Sween Lotion (Moisturizing lotion) 1 x Per Week/ Discharge Instructions: Apply moisturizing lotion as directed Prim Dressing: Maxorb Extra Ag+ Alginate Dressing, 4x4.75 (in/in) 1 x Per  Week/ ary Discharge Instructions: Apply to wound bed as instructed [place], [name] ([id number]) [id number] Page 12 of 14 Secondary Dressing: ABD Pad, 8x10 1 x Per Week/ Discharge Instructions: Apply over primary dressing as directed. Com pression Wrap: Urgo K2 Lite, two layer compression system, regular 1 x Per Week/ Discharge Instructions: Apply Urgo K2 Lite as directed (alternative to 3 layer compression). WOUND #9: - T Second Wound Laterality: Right, Medial oe Prim Dressing: Maxorb Extra Ag+ Alginate Dressing, 2x2 (in/in) Every Other Day/30 Days ary Discharge Instructions: Apply to wound bed as instructed Secondary Dressing: Woven Gauze Sponges 2x2 in Every Other Day/30 Days Discharge Instructions: Apply over primary dressing as directed. Secured With: Insurance underwriter, Sterile 2x75 (in/in) Every Other Day/30 Days Discharge Instructions: Secure with stretch gauze as directed. 06/05/2022: The more proximal of the left leg ulcers is closed by about 50%. There is some eschar on the wound surface. The anterior left leg ulcer is nearly healed with just a little eschar on the remaining open portion. The lateral foot ulcer has some eschar and slough present the most recent right lower leg wound has slough and fat exposed. The right second toe ulcer looks about the same with a layer of callus and slough. I used a curette to debride eschar from both of the left leg ulcers. I debrided slough and eschar from the left lateral foot ulcer. I debrided slough and fat from the right lower leg ulcer and callus and slough from the right second toe ulcer. She also complained of discomfort and pressure from a callus at her right midfoot so I pared this back for her. We will continue silver alginate to all sites with bilateral 3 layer  compression equivalent. Follow-up in 1 week. Electronic Signature(s) Signed: 06/05/2022 10:04:22 AM By: Duanne Guess MD FACS Previous  Signature: 06/05/2022 9:52:20 AM Version By: Duanne Guess MD FACS Entered By: Duanne Guess on 06/05/2022 10:04:22 -------------------------------------------------------------------------------- HxROS Details Patient Name: Date of Service: [name], [name] 06/05/2022 8:15 A M Medical Record Number: [id number] Patient Account Number: [id number] Date of Birth/Sex: Treating RN: [birth year] (89 y.o. F) Primary Care Provider: Romeo Rabon Other Clinician: Referring Provider: Treating Provider/Extender: [name] in Treatment: 11 Information Obtained From Patient Eyes Medical History: Positive for: Cataracts Hematologic/Lymphatic Medical History: Positive for: Lymphedema Respiratory Medical History: Positive for: Sleep Apnea Cardiovascular Medical History: Positive for: Coronary Artery Disease; Hypertension Gastrointestinal Medical History: Positive for: Hepatitis B - 50+ yrs ago Past Medical History Notes: diverticulosis, GERD Musculoskeletal Medical History: Positive for: Osteoarthritis Past Medical History Notes: osteoporosis HBO Extended History Items LIEBOLD, [name] ([id number]) [id number] Page 13 of 14 Eyes: Cataracts Immunizations Pneumococcal Vaccine: Received Pneumococcal Vaccination: Yes Received Pneumococcal Vaccination On or After 60th Birthday: Yes Implantable Devices No devices added Hospitalization / Surgery History Type of Hospitalization/Surgery abdominal hysterectomy cardiac catheterization cataract extraction bilat. colonoscopy bilateral total knee replacement upper GI endoscopy Family and Social History Cancer: No; Diabetes: No; Heart Disease: Yes - Father; Hereditary Spherocytosis: No; Hypertension: Yes - Mother; Kidney Disease: No; Lung Disease: No; Seizures: No; Stroke: No; Thyroid Problems: No; Tuberculosis: No; Never smoker; Marital Status - Married; Alcohol Use: Rarely; Drug Use:  No History; Caffeine Use: Moderate; Financial Concerns: No; Food, Clothing or Shelter Needs: No; Support System Lacking: No; Transportation Concerns: No Electronic Signature(s) Signed: 06/05/2022 10:02:57 AM By: Duanne Guess MD FACS Entered By: Duanne Guess on 06/05/2022 09:50:58 -------------------------------------------------------------------------------- SuperBill Details Patient Name: Date of Service: [name], [name] 06/05/2022 Medical Record Number: [id number] Patient Account Number: [id number] Date of Birth/Sex: Treating RN: 1930/03/28 (89 y.o. F) Primary Care Provider: Romeo Rabon Other Clinician: Referring Provider: Treating Provider/Extender: [name] in Treatment: 11 Diagnosis Coding ICD-10 Codes Code Description [phone] Non-pressure chronic ulcer of other part of left lower leg with fat layer exposed L97.512 Non-pressure chronic ulcer of other part of right foot with fat layer exposed L97.812 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ed R60.0 Localized edema I87.2 Venous insufficiency (chronic) (peripheral) Z79.52 Long term (current) use of systemic steroids L84 Corns and callosities Facility Procedures : CPT4 Code: [id number] Description: 11042 - DEB SUBQ TISSUE 20 SQ CM/< ICD-10 Diagnosis Description L97.812 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 Modifier: ed Quantity: 1 : CPT4 Code: [id number] Description: 97597 - DEBRIDE WOUND 1ST 20 SQ CM OR < ICD-10 Diagnosis Description L97.822 Non-pressure chronic ulcer of other part of left lower leg with fat layer expose L97.512 Non-pressure chronic ulcer of other part of right foot with fat layer  exposed Modifier: d Quantity: 1 Physician Procedures : CPT4 Code Description Modifier [id number] 99213 - WC PHYS LEVEL 3 - EST PT 25 ICD-10 Diagnosis Description L97.822 Non-pressure chronic ulcer of other part of  left lower leg with fat layer exposed L97.512 Non-pressure chronic ulcer of other part of right  foot with fat layer exposed L97.812 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ed Z79.52 Long term (current) use of systemic steroids Quantity: 1 : [id number] 11042 - WC PHYS SUBQ TISS 20 SQ CM ICD-10 Diagnosis Description L97.812 Non-pressure chronic ulcer of other part of  right lower leg with fat layer exposed Quantity: 1 : [id number] 97597 - WC PHYS DEBR WO ANESTH 20 SQ CM ICD-10 Diagnosis Description 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

## 2022-06-12 ENCOUNTER — Ambulatory Visit (HOSPITAL_BASED_OUTPATIENT_CLINIC_OR_DEPARTMENT_OTHER): Payer: Medicare Other | Admitting: General Surgery

## 2022-06-13 ENCOUNTER — Encounter (HOSPITAL_BASED_OUTPATIENT_CLINIC_OR_DEPARTMENT_OTHER): Payer: Medicare Other | Admitting: General Surgery

## 2022-06-13 DIAGNOSIS — L97512 Non-pressure chronic ulcer of other part of right foot with fat layer exposed: Secondary | ICD-10-CM | POA: Diagnosis not present

## 2022-06-19 ENCOUNTER — Encounter (HOSPITAL_BASED_OUTPATIENT_CLINIC_OR_DEPARTMENT_OTHER): Payer: Medicare Other | Admitting: General Surgery

## 2022-06-19 DIAGNOSIS — L97512 Non-pressure chronic ulcer of other part of right foot with fat layer exposed: Secondary | ICD-10-CM | POA: Diagnosis not present

## 2022-06-28 ENCOUNTER — Encounter (HOSPITAL_BASED_OUTPATIENT_CLINIC_OR_DEPARTMENT_OTHER): Payer: Medicare Other | Admitting: General Surgery

## 2022-06-28 DIAGNOSIS — L97512 Non-pressure chronic ulcer of other part of right foot with fat layer exposed: Secondary | ICD-10-CM | POA: Diagnosis not present

## 2022-06-29 NOTE — Progress Notes (Signed)
MAY, CHHENG ([id number]) [id number] Page 1 of 1 Visit Report for 06/28/2022 SuperBill Details Patient Name: Date of Service: [name] 06/28/2022 Medical Record Number: [id number] Patient Account Number: [id number] Date of Birth/Sex: Treating RN: 1930-06-09 (89 y.o. 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 L97.812 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ed R60.0 Localized edema I87.2 Venous insufficiency (chronic) (peripheral) Z79.52 Long term (current) use of systemic steroids L84 Corns and callosities Facility Procedures CPT4 Code Description Modifier Quantity [phone] (Facility Use Only) [phone] - APPLY MULTLAY COMPRS LWR RT LEG 1 ICD-10 Diagnosis Description L97.812 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ed Electronic Signature(s) Signed: 06/28/2022 3:41:44 PM By: Samuella Bruin Signed: 06/28/2022 4:00:14 PM By: Duanne Guess MD FACS Entered By: Samuella Bruin on 06/28/2022 15:38:11

## 2022-07-03 ENCOUNTER — Encounter (HOSPITAL_BASED_OUTPATIENT_CLINIC_OR_DEPARTMENT_OTHER): Payer: Medicare Other | Attending: General Surgery | Admitting: General Surgery

## 2022-07-03 DIAGNOSIS — L97522 Non-pressure chronic ulcer of other part of left foot with fat layer exposed: Secondary | ICD-10-CM | POA: Insufficient documentation

## 2022-07-03 DIAGNOSIS — L97812 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ed: Secondary | ICD-10-CM | POA: Insufficient documentation

## 2022-07-03 DIAGNOSIS — Z7952 Long term (current) use of systemic steroids: Secondary | ICD-10-CM | POA: Diagnosis not present

## 2022-07-03 DIAGNOSIS — R600 Localized edema: Secondary | ICD-10-CM | POA: Insufficient documentation

## 2022-07-03 DIAGNOSIS — I872 Venous insufficiency (chronic) (peripheral): Secondary | ICD-10-CM | POA: Insufficient documentation

## 2022-07-03 DIAGNOSIS — L84 Corns and callosities: Secondary | ICD-10-CM | POA: Insufficient documentation

## 2022-07-10 ENCOUNTER — Encounter (HOSPITAL_BASED_OUTPATIENT_CLINIC_OR_DEPARTMENT_OTHER): Payer: Medicare Other | Admitting: General Surgery

## 2022-07-10 DIAGNOSIS — I872 Venous insufficiency (chronic) (peripheral): Secondary | ICD-10-CM | POA: Diagnosis not present

## 2022-07-11 NOTE — Progress Notes (Signed)
PINKEY, KILLEEN ([id number]) [id number] Page 1 of 9 Visit Report for 07/10/2022 Chief Complaint Document Details Patient Name: Date of Service: [name], [place] RO L 07/10/2022 10:15 A M Medical Record Number: [id number] Patient Account Number: [id number] Date of Birth/Sex: Treating RN: 1931-01-18 (89 y.o. F) Primary Care Provider: Romeo Rabon Other Clinician: Referring Provider: Treating Provider/Extender: [name] in Treatment: 16 Information Obtained from: Patient Chief Complaint Patient presents for treatment of open ulcers due to venous insufficiency Electronic Signature(s) Signed: 07/10/2022 11:03:53 AM By: Duanne Guess MD FACS Entered By: Duanne Guess on 07/10/2022 11:03:53 -------------------------------------------------------------------------------- Debridement Details Patient Name: Date of Service: [name], [name] 07/10/2022 10:15 A M Medical Record Number: [id number] Patient Account Number: [id number] Date of Birth/Sex: Treating RN: Oct 15, 1930 (89 y.o. Katrinka Blazing Primary Care Provider: Romeo Rabon Other Clinician: Referring Provider: Treating Provider/Extender: [name] in Treatment: 16 Debridement Performed for Assessment: Wound #10 Right,Medial Lower Leg Performed By: Physician Duanne Guess, MD Debridement Type: Debridement Severity of Tissue Pre Debridement: Fat layer exposed Level of Consciousness (Pre-procedure): Awake and Alert Pre-procedure Verification/Time Out Yes - 10:37 Taken: Start Time: 10:37 Pain Control: Lidocaine 5% topical ointment Percent of Wound Bed Debrided: 100% T Area Debrided (cm): otal 0.07 Tissue and other material debrided: Non-Viable, Eschar, Slough, Slough Level: Non-Viable Tissue Debridement Description: Selective/Open Wound Instrument: Curette Bleeding: Minimum Hemostasis Achieved: Pressure End Time: 10:39 Procedural  Pain: 0 Post Procedural Pain: 0 Response to Treatment: Procedure was tolerated well Level of Consciousness (Post- Awake and Alert procedure): Post Debridement Measurements of Total Wound Length: (cm) 0.3 Width: (cm) 0.3 Depth: (cm) 0.1 Volume: (cm) 0.007 Character of Wound/Ulcer Post Debridement: Improved Severity of Tissue Post Debridement: Fat layer exposed Post Procedure Diagnosis Same as Pre-procedure Notes Scribed for Dr.Adraine Biffle by [employer], Annelisa ([id number]) [id number].pdf Page 2 of 9 Electronic Signature(s) Signed: 07/10/2022 12:46:55 PM By: Duanne Guess MD FACS Signed: 07/10/2022 5:37:23 PM By: Karie Schwalbe RN Entered By: Karie Schwalbe on 07/10/2022 10:43:53 -------------------------------------------------------------------------------- HPI Details Patient Name: Date of Service: [name], [name] 07/10/2022 10:15 A M Medical Record Number: [id number] Patient Account Number: [id number] Date of Birth/Sex: Treating RN: 17-Nov-1930 (89 y.o. F) Primary Care Provider: Romeo Rabon Other Clinician: Referring Provider: Treating Provider/Extender: [name] in Treatment: 16 History of Present Illness HPI Description: ADMISSION 03/20/2022 This is an 89 year old woman with coronary artery disease, on chronic corticosteroids for osteoarthritis, who presents with bilateral lower leg ulcers. They have been present for a couple of weeks. I do not have any vascular studies available to discern whether or not she has had venous reflux or arterial studies done. ABIs in clinic were 1.34 on the left and 0.95 on the right. She is not diabetic. She does not smoke. Her legs are quite edematous and she has multiple hematomas of various sizes and ages extending up to at least the distal thigh. She has 3 ulcers on the left leg with slough and eschar accumulation. On the right leg, she has a tiny superficial ulcer on the  anterior tibial surface. She also has a small ulcer on the lateral aspect of her right third toe. 03/27/2022: The ulcer on her right anterior tibial surface and lateral third toe are both healed. The ulcers on her left leg are smaller but have slough accumulation. Edema control is good. Her juxta lite stockings arrived in the mail, but she did not bring them with her today. 04/06/2022: She has a new skin  tear just distal to her left knee. The ulcer on her left upper posterior calf is nearly healed under a layer of eschar. She has a spot on her right second toe that she is concerned about, saying that she frequently gets an ulcer there, but I do not see any open wound in this site. 04/21/2022: All of her wounds are healed except for the skin tear distal to her left knee. There is a layer of eschar on the surface. Underneath, the wound is quite superficial and clean. 04/19/2022: All of her wounds are healed. She is wearing a juxta lite stocking on her right leg and has brought her other stocking for [place] to apply to her left. 05/22/2022: She returns today with new ulcers on her left leg. A large hematoma/blood blister on her upper lateral leg, just distal to the knee opened up shortly after she was healed out from our clinic. There is slough on the wound surface, but no concern for infection. She has a second ulcer on her anterior tibial surface with slough on the surface. The small site on her right second toe that we have been monitoring has opened. There is some slough in the center with surrounding callus. She has a hematoma/blood blister on the medial aspect of her right lower leg that is not open, but it is certainly threatening to do so. 05/29/2022: The hematoma that had threatened to open last week is now open. There is slough and hematoma and nonviable subcutaneous tissue present. The toe ulcer is down to a small pinhole, but there is some undermining from some dry skin present. The ulcers on her left  leg are both smaller. There is some slough accumulation on both. 06/05/2022: The more proximal of the left leg ulcers is closed by about 50%. There is some eschar on the wound surface. The anterior left leg ulcer is nearly healed with just a little eschar on the remaining open portion. The lateral foot ulcer has some eschar and slough present the most recent right lower leg wound has slough and fat exposed. The right second toe ulcer looks about the same with a layer of callus and slough. 06/13/2022: The anterior left leg ulcer is closed. The proximal left lower leg ulcer is down to just about a centimeter with some slough and eschar on the surface. The right foot ulcer is healed. The right anterior lower leg wound continues to accumulate slough but is a little bit smaller today. The right second toe ulcer is smaller underneath the layer of callus and slough. 06/19/2022: The proximal left lower leg ulcer is down to just a couple of millimeters. It is clean. The right anterior lower leg wound is smaller again today, but still with some slough buildup. The right second toe ulcer is down to just a pinpoint opening underneath some callus. 07/03/2022: The left lower leg ulcer is closed. The right anterior lower leg wound is smaller again this week. There is a little bit of light slough accumulation. The right second toe ulcer is unchanged underneath its layer of callus. She is also complaining of pain related to a callus in her midfoot as well as one on her fifth toe. 07/10/2022: The right anterior lower leg wound is nearly closed underneath the layer of eschar. The right second toe ulcer appears to have healed. The fifth toe callus on her left foot has reaccumulated and she says it is causing her pain. After debridement, I discovered an ulcer at the base of  this callus. Electronic Signature(s) Signed: 07/10/2022 11:05:03 AM By: Duanne Guess MD FACS Entered By: Duanne Guess on 07/10/2022  11:05:03 -------------------------------------------------------------------------------- Physical Exam Details Patient Name: Date of Service: [name], [name] 07/10/2022 10:15 A M Medical Record Number: [id number] Patient Account Number: [id number] Date of Birth/Sex: Treating RN: [birth year] (89 y.o. F) Primary Care Provider: Romeo Rabon Other Clinician: Referring Provider: Treating Provider/Extender: Marcie Bal [place], [name] ([id number]) [id number] Page 3 of 9 Weeks in Treatment: 16 Constitutional . . . . no acute distress. Respiratory Normal work of breathing on room air. Notes 07/10/2022: The right anterior lower leg wound is nearly closed underneath the layer of eschar. The right second toe ulcer appears to have healed. The fifth toe callus on her left foot has reaccumulated and she says it is causing her pain. After debridement, I discovered an ulcer at the base of this callus. Electronic Signature(s) Signed: 07/10/2022 11:05:54 AM By: Duanne Guess MD FACS Previous Signature: 07/10/2022 11:05:33 AM Version By: Duanne Guess MD FACS Entered By: Duanne Guess on 07/10/2022 11:05:54 -------------------------------------------------------------------------------- Physician Orders Details Patient Name: Date of Service: [name], [name] 07/10/2022 10:15 A M Medical Record Number: [id number] Patient Account Number: [id number] Date of Birth/Sex: Treating RN: 15-Apr-1930 (89 y.o. Katrinka Blazing Primary Care Provider: Romeo Rabon Other Clinician: Referring Provider: Treating Provider/Extender: [name] in Treatment: 16 Verbal / Phone Orders: No Diagnosis Coding ICD-10 Coding Code Description [phone] 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ed L97.522 Non-pressure chronic ulcer of other part of left foot with fat layer exposed R60.0 Localized  edema I87.2 Venous insufficiency (chronic) (peripheral) Z79.52 Long term (current) use of systemic steroids L84 Corns and callosities Follow-up Appointments ppointment in 2 weeks. - Dr. Lady Gary - Room 3 Return A Monday 6/17 at 10:15am Bathing/ Shower/ Hygiene May shower with protection but do not get wound dressing(s) wet. Protect dressing(s) with water repellant cover (for example, large plastic bag) or a cast cover and may then take shower. Edema Control - Lymphedema / SCD / Other Elevate legs to the level of the heart or above for 30 minutes daily and/or when sitting for 3-4 times a day throughout the day. Avoid standing for long periods of time. Exercise regularly Moisturize legs daily. - left leg daily Compression stocking or Garment 20-30 mm/Hg pressure to: - juxtalite to left leg daily Wound Treatment Wound #10 - Lower Leg Wound Laterality: Right, Medial Peri-Wound Care: Sween Lotion (Moisturizing lotion) 1 x Per Week/30 Days Discharge Instructions: Apply moisturizing lotion as directed Prim Dressing: Maxorb Extra Ag+ Alginate Dressing, 4x4.75 (in/in) 1 x Per Week/30 Days ary Discharge Instructions: Apply to wound bed as instructed Secondary Dressing: Woven Gauze Sponge, Non-Sterile 4x4 in 1 x Per Week/30 Days Discharge Instructions: Apply over primary dressing as directed. Compression Wrap: Urgo K2 Lite, (equivalent to a 3 layer) two layer compression system, regular 1 x Per Week/30 Days Discharge Instructions: Apply Urgo K2 Lite as directed (alternative to 3 layer compression). Wound #12 - T Fifth oe Wound Laterality: Left Cleanser: Soap and Water Every Other Day/30 Days Discharge Instructions: May shower and wash wound with dial antibacterial soap and water prior to dressing change. SHERENE, PLANCARTE ([id number]) [id number] Page 4 of 9 Cleanser: Wound Cleanser Every Other Day/30 Days Discharge Instructions: Cleanse the wound with wound cleanser  prior to applying a clean dressing using gauze sponges, not tissue or cotton balls. Prim Dressing: Maxorb Extra Ag+ Alginate Dressing,  2x2 (in/in) Every Other Day/30 Days ary Discharge Instructions: Apply to wound bed as instructed Secondary Dressing: Bordered Gauze, 2x2 in Every Other Day/30 Days Discharge Instructions: Apply over primary dressing as directed. Electronic Signature(s) Signed: 07/10/2022 12:46:55 PM By: Duanne Guess MD FACS Entered By: Duanne Guess on 07/10/2022 11:07:46 -------------------------------------------------------------------------------- Problem List Details Patient Name: Date of Service: [name], [name] 07/10/2022 10:15 A M Medical Record Number: [id number] Patient Account Number: [id number] Date of Birth/Sex: Treating RN: [birth year] (89 y.o. F) Primary Care Provider: Romeo Rabon Other Clinician: Referring Provider: Treating Provider/Extender: [name] in Treatment: 16 Active Problems ICD-10 Encounter Code Description Active Date MDM Diagnosis [phone] Non-pressure chronic ulcer of other part of right lower leg with fat layer 05/29/2022 No Yes exposed L97.522 Non-pressure chronic ulcer of other part of left foot with fat layer exposed 07/10/2022 No Yes R60.0 Localized edema 03/20/2022 No Yes I87.2 Venous insufficiency (chronic) (peripheral) 03/20/2022 No Yes Z79.52 Long term (current) use of systemic steroids 03/20/2022 No Yes L84 Corns and callosities 06/05/2022 No Yes Inactive Problems Resolved Problems ICD-10 Code Description Active Date Resolved Date L97.811 Non-pressure chronic ulcer of other part of right lower leg limited to breakdown of skin 03/20/2022 03/20/2022 L97.822 Non-pressure chronic ulcer of other part of left lower leg with fat layer exposed 05/22/2022 03/20/2022 L97.511 Non-pressure chronic ulcer of other part of right foot limited to breakdown of skin 03/20/2022 03/20/2022 CARAMIA, FETTIG ([id number])  [id number] Page 5 of 9 L97.512 Non-pressure chronic ulcer of other part of right foot with fat layer exposed 05/22/2022 05/22/2022 Electronic Signature(s) Signed: 07/10/2022 11:03:27 AM By: Duanne Guess MD FACS Entered By: Duanne Guess on 07/10/2022 11:03:27 -------------------------------------------------------------------------------- Progress Note Details Patient Name: Date of Service: [name], [name] 07/10/2022 10:15 A M Medical Record Number: [id number] Patient Account Number: [id number] Date of Birth/Sex: Treating RN: [birth year] (89 y.o. F) Primary Care Provider: Romeo Rabon Other Clinician: Referring Provider: Treating Provider/Extender: [name] in Treatment: 16 Subjective Chief Complaint Information obtained from Patient Patient presents for treatment of open ulcers due to venous insufficiency History of Present Illness (HPI) ADMISSION 03/20/2022 This is an 89 year old woman with coronary artery disease, on chronic corticosteroids for osteoarthritis, who presents with bilateral lower leg ulcers. They have been present for a couple of weeks. I do not have any vascular studies available to discern whether or not she has had venous reflux or arterial studies done. ABIs in clinic were 1.34 on the left and 0.95 on the right. She is not diabetic. She does not smoke. Her legs are quite edematous and she has multiple hematomas of various sizes and ages extending up to at least the distal thigh. She has 3 ulcers on the left leg with slough and eschar accumulation. On the right leg, she has a tiny superficial ulcer on the anterior tibial surface. She also has a small ulcer on the lateral aspect of her right third toe. 03/27/2022: The ulcer on her right anterior tibial surface and lateral third toe are both healed. The ulcers on her left leg are smaller but have slough accumulation. Edema control is good. Her juxta lite  stockings arrived in the mail, but she did not bring them with her today. 04/06/2022: She has a new skin tear just distal to her left knee. The ulcer on her left upper posterior calf is nearly healed under a layer of eschar. She has a spot on her right second toe that she is concerned about, saying  that she frequently gets an ulcer there, but I do not see any open wound in this site. 04/21/2022: All of her wounds are healed except for the skin tear distal to her left knee. There is a layer of eschar on the surface. Underneath, the wound is quite superficial and clean. 04/19/2022: All of her wounds are healed. She is wearing a juxta lite stocking on her right leg and has brought her other stocking for [place] to apply to her left. 05/22/2022: She returns today with new ulcers on her left leg. A large hematoma/blood blister on her upper lateral leg, just distal to the knee opened up shortly after she was healed out from our clinic. There is slough on the wound surface, but no concern for infection. She has a second ulcer on her anterior tibial surface with slough on the surface. The small site on her right second toe that we have been monitoring has opened. There is some slough in the center with surrounding callus. She has a hematoma/blood blister on the medial aspect of her right lower leg that is not open, but it is certainly threatening to do so. 05/29/2022: The hematoma that had threatened to open last week is now open. There is slough and hematoma and nonviable subcutaneous tissue present. The toe ulcer is down to a small pinhole, but there is some undermining from some dry skin present. The ulcers on her left leg are both smaller. There is some slough accumulation on both. 06/05/2022: The more proximal of the left leg ulcers is closed by about 50%. There is some eschar on the wound surface. The anterior left leg ulcer is nearly healed with just a little eschar on the remaining open portion. The lateral foot  ulcer has some eschar and slough present the most recent right lower leg wound has slough and fat exposed. The right second toe ulcer looks about the same with a layer of callus and slough. 06/13/2022: The anterior left leg ulcer is closed. The proximal left lower leg ulcer is down to just about a centimeter with some slough and eschar on the surface. The right foot ulcer is healed. The right anterior lower leg wound continues to accumulate slough but is a little bit smaller today. The right second toe ulcer is smaller underneath the layer of callus and slough. 06/19/2022: The proximal left lower leg ulcer is down to just a couple of millimeters. It is clean. The right anterior lower leg wound is smaller again today, but still with some slough buildup. The right second toe ulcer is down to just a pinpoint opening underneath some callus. 07/03/2022: The left lower leg ulcer is closed. The right anterior lower leg wound is smaller again this week. There is a little bit of light slough accumulation. The right second toe ulcer is unchanged underneath its layer of callus. She is also complaining of pain related to a callus in her midfoot as well as one on her fifth toe. 07/10/2022: The right anterior lower leg wound is nearly closed underneath the layer of eschar. The right second toe ulcer appears to have healed. The fifth toe callus on her left foot has reaccumulated and she says it is causing her pain. After debridement, I discovered an ulcer at the base of this callus. Patient History Information obtained from Patient. Family History Heart Disease - Father, Hypertension - Mother, No family history of Cancer, Diabetes, Hereditary Spherocytosis, Kidney Disease, Lung Disease, Seizures, Stroke, Thyroid Problems, Tuberculosis. Social History Ludolph, Vandella (  [id number]) [id number] Page 6 of 9 Never smoker, Marital Status - Married, Alcohol Use - Rarely, Drug Use - No History,  Caffeine Use - Moderate. 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 - cardiac catheterization. - cataract extraction bilat.. - colonoscopy. - bilateral total knee replacement. - upper GI endoscopy. Medical A Surgical History Notes nd Gastrointestinal diverticulosis, GERD Musculoskeletal osteoporosis Objective Constitutional no acute distress. Vitals Time Taken: 10:12 AM, Height: 68 in, Weight: 180 lbs, BMI: 27.4, Temperature: 98.9 F, Pulse: 68 bpm, Respiratory Rate: 20 breaths/min, Blood Pressure: 135/85 mmHg. Respiratory Normal work of breathing on room air. General Notes: 07/10/2022: The right anterior lower leg wound is nearly closed underneath the layer of eschar. The right second toe ulcer appears to have healed. The fifth toe callus on her left foot has reaccumulated and she says it is causing her pain. After debridement, I discovered an ulcer at the base of this callus. Integumentary (Hair, Skin) Wound #10 status is Open. Original cause of wound was Gradually Appeared. The date acquired was: 05/29/2022. The wound has been in treatment 6 weeks. The wound is located on the Right,Medial Lower Leg. The wound measures 0.3cm length x 0.3cm width x 0.1cm depth; 0.071cm^2 area and 0.007cm^3 volume. There is Fat Layer (Subcutaneous Tissue) exposed. There is no tunneling or undermining noted. There is a medium amount of serosanguineous drainage noted. The wound margin is flat and intact. There is small (1-33%) pink granulation within the wound bed. There is a large (67-100%) amount of necrotic tissue within the wound bed  including Eschar and Adherent Slough. The periwound skin appearance had no abnormalities noted for texture. The periwound skin appearance had no abnormalities noted for moisture. The periwound skin appearance had no abnormalities noted for color. Periwound temperature was noted as No Abnormality. Wound #12 status is Open. Original cause of wound was Gradually Appeared. The date acquired was: 07/10/2022. The wound is located on the Left T Fifth. oe The wound measures 0.2cm length x 0.2cm width x 0.1cm depth; 0.031cm^2 area and 0.003cm^3 volume. There is no tunneling or undermining noted. There is a medium amount of serosanguineous drainage noted. There is medium (34-66%) red granulation within the wound bed. There is a medium (34-66%) amount of necrotic tissue within the wound bed including Eschar and Adherent Slough. The periwound skin appearance had no abnormalities noted for moisture. The periwound skin appearance had no abnormalities noted for color. The periwound skin appearance exhibited: Callus. Periwound temperature was noted as No Abnormality. Wound #9 status is Healed - Epithelialized. Original cause of wound was Gradually Appeared. The date acquired was: 04/30/2020. The wound has been in treatment 7 weeks. The wound is located on the Right,Medial T Second. The wound measures 0cm length x 0cm width x 0cm depth; 0cm^2 area and 0cm^3 oe volume. There is no tunneling or undermining noted. There is a none present amount of drainage noted. The wound margin is indistinct and nonvisible. There is no granulation within the wound bed. There is no necrotic tissue within the wound bed. The periwound skin appearance had no abnormalities noted for texture. The periwound skin appearance had no abnormalities noted for moisture. The periwound skin appearance had no abnormalities noted for color. Periwound temperature was noted as  No Abnormality. The periwound has tenderness on palpation. 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 There was a Selective/Open Wound Non-Viable Tissue Debridement with a total area of 0.07 sq cm performed by Duanne Guess, MD. With the following instrument(s): Curette to remove Non-Viable tissue/material. Material removed includes Eschar and Slough and after achieving pain control using Lidocaine 5% topical ointment. No specimens were taken. A time out was conducted at 10:37, prior to the start of the procedure. A Minimum amount of bleeding was controlled with Pressure. The procedure was tolerated well with a pain level of 0 throughout and a pain level of 0 following the procedure. Post Debridement Measurements: 0.3cm length x 0.3cm width x 0.1cm depth; 0.007cm^3 volume. Character of Wound/Ulcer Post Debridement is improved. Severity of Tissue Post Debridement is: Fat layer exposed. Post procedure Diagnosis Wound #10: Same as Pre-Procedure General Notes: Scribed for Dr.Zoe Creasman by J.Scotton. 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 There was a Three Layer Compression Therapy Procedure by Karie Schwalbe, RN. Post procedure Diagnosis Wound #10: Same as Pre-Procedure Plan Follow-up Appointments: Return Appointment in 2 weeks. - Dr. Lady Gary - Room 3 Monday 6/17 at 10:15am Bathing/ Shower/ Hygiene: May shower with protection but do not get wound dressing(s) wet. Protect dressing(s) with  water repellant cover (for example, large plastic bag) or a cast cover and may then take shower. Edema Control - Lymphedema / SCD / Other: Elevate legs to the level of the heart or above for 30 minutes daily and/or when sitting for 3-4 times a day throughout the day. Avoid standing for long periods of time. Exercise regularly Moisturize legs daily. - left leg daily Compression stocking or Garment 20-30 mm/Hg pressure to: - juxtalite to left leg daily WOUND #10: - Lower Leg Wound Laterality: Right, Medial Peri-Wound Care: Sween Lotion (Moisturizing lotion) 1 x Per Week/30 Days Discharge Instructions: Apply moisturizing lotion as directed Prim Dressing: Maxorb Extra Ag+ Alginate Dressing, 4x4.75 (in/in) 1 x Per Week/30 Days ary Discharge Instructions: Apply to wound bed as instructed Secondary Dressing: Woven Gauze Sponge, Non-Sterile 4x4 in 1 x Per Week/30 Days Discharge Instructions: Apply over primary dressing as directed. Com pression Wrap: Urgo K2 Lite, (equivalent to a 3 layer) two layer compression system, regular 1 x Per Week/30 Days Discharge Instructions: Apply Urgo K2 Lite as directed (alternative to 3 layer compression). WOUND #12: - T Fifth Wound Laterality: Left oe Cleanser: Soap and Water Every Other Day/30 Days Discharge Instructions: May shower and wash wound with dial antibacterial soap and water prior to dressing change. Cleanser: Wound Cleanser Every Other Day/30 Days Discharge Instructions: Cleanse the wound with wound cleanser prior to applying a clean dressing using gauze sponges, not tissue or cotton balls. Prim Dressing: Maxorb Extra Ag+ Alginate Dressing, 2x2 (in/in) Every Other Day/30 Days ary Discharge Instructions: Apply to wound bed as instructed Secondary Dressing: Bordered Gauze, 2x2 in Every Other Day/30 Days Discharge Instructions: Apply over primary dressing as directed. 07/10/2022: The right anterior lower leg wound is nearly  closed underneath the layer  of eschar. The right second toe ulcer appears to have healed. The fifth toe callus on her left foot has reaccumulated and she says it is causing her pain. After debridement, I discovered an ulcer at the base of this callus. I used a curette to debride slough and eschar from the right anterior leg wound and callus and slough from the left fifth toe wound. We will apply silver alginate to both with 3 layer compression/equivalent on the right leg. She should be wearing her juxta lite stocking on the left; she admits to only intermittently using it correctly. I emphasized to her the importance of wearing it on a regular basis and that she just become part of her daily dressing routine. Follow-up in 1 week. Electronic Signature(s) Signed: 07/11/2022 7:51:08 AM By: Shawn Stall RN, BSN Signed: 07/11/2022 8:46:38 AM By: Duanne Guess MD FACS Previous Signature: 07/10/2022 11:08:54 AM Version By: Duanne Guess MD FACS Entered By: Shawn Stall on 07/11/2022 07:48:58 -------------------------------------------------------------------------------- HxROS Details Patient Name: Date of Service: [name], [name] 07/10/2022 10:15 A M Medical Record Number: [id number] Patient Account Number: [id number] Date of Birth/Sex: Treating RN: 1930/03/01 (89 y.o. F) Primary Care Provider: Romeo Rabon Other Clinician: Valaria Good ([id number]) [id number] Page 8 of 9 Referring Provider: Treating Provider/Extender: [name] in Treatment: 16 Information Obtained From Patient Eyes Medical History: Positive for: Cataracts Hematologic/Lymphatic Medical History: Positive for: Lymphedema Respiratory Medical History: Positive for: Sleep Apnea Cardiovascular Medical History: Positive for: Coronary Artery Disease; Hypertension Gastrointestinal Medical History: Positive for: Hepatitis B - 50+ yrs ago Past Medical History Notes: diverticulosis,  GERD Musculoskeletal Medical History: Positive for: Osteoarthritis Past Medical History Notes: osteoporosis HBO Extended History Items Eyes: Cataracts Immunizations Pneumococcal Vaccine: Received Pneumococcal Vaccination: Yes Received Pneumococcal Vaccination On or After 60th Birthday: Yes Implantable Devices No devices added Hospitalization / Surgery History Type of Hospitalization/Surgery abdominal hysterectomy cardiac catheterization cataract extraction bilat. colonoscopy bilateral total knee replacement upper GI endoscopy Family and Social History Cancer: No; Diabetes: No; Heart Disease: Yes - Father; Hereditary Spherocytosis: No; Hypertension: Yes - Mother; Kidney Disease: No; Lung Disease: No; Seizures: No; Stroke: No; Thyroid Problems: No; Tuberculosis: No; Never smoker; Marital Status - Married; Alcohol Use: Rarely; Drug Use: No History; Caffeine Use: Moderate; Financial Concerns: No; Food, Clothing or Shelter Needs: No; Support System Lacking: No; Transportation Concerns: No Electronic Signature(s) Signed: 07/10/2022 12:46:55 PM By: Duanne Guess MD FACS Entered By: Duanne Guess on 07/10/2022 11:05:09 SuperBill Details -------------------------------------------------------------------------------- Valaria Good ([id number]) [id number] Page 9 of 9 Patient Name: Date of Service: [name], [place] 07/10/2022 Medical Record Number: [id number] Patient Account Number: [id number] Date of Birth/Sex: Treating RN: Apr 19, 1930 (89 y.o. F) Primary Care Provider: Romeo Rabon Other Clinician: Referring Provider: Treating Provider/Extender: [name] in Treatment: 16 Diagnosis Coding ICD-10 Codes Code Description [phone] 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ed L97.522 Non-pressure chronic ulcer of other part of left foot with fat layer exposed R60.0 Localized  edema I87.2 Venous insufficiency (chronic) (peripheral) Z79.52 Long term (current) use of systemic steroids L84 Corns and callosities Facility Procedures CPT4 Code Description Modifier Quantity [phone] - DEBRIDE WOUND 1ST 20 SQ CM OR < 1 ICD-10 Diagnosis Description L97.812 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ed L97.522 Non-pressure chronic ulcer of other part of left foot with fat layer exposed Physician Procedures Quantity CPT4 Code Description Modifier [phone]  [id number] - WC PHYS LEVEL 3 - EST PT 25 1 ICD-10 Diagnosis Description L97.812 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ed L97.522 Non-pressure chronic ulcer of other part of left foot with fat layer exposed Z79.52 Long term (current) use of systemic steroids I87.2 Venous insufficiency (chronic) (peripheral) [id number] - WC PHYS DEBR WO ANESTH 20 SQ CM 1 ICD-10 Diagnosis Description L97.812 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ed L97.522 Non-pressure chronic ulcer of other part of left foot with fat layer exposed Electronic Signature(s) Signed: 07/10/2022 11:09:17 AM By: Duanne Guess MD FACS Entered By: Duanne Guess on 07/10/2022 11:09:17

## 2022-07-17 ENCOUNTER — Encounter (HOSPITAL_BASED_OUTPATIENT_CLINIC_OR_DEPARTMENT_OTHER): Payer: Medicare Other | Admitting: General Surgery

## 2022-07-17 DIAGNOSIS — I872 Venous insufficiency (chronic) (peripheral): Secondary | ICD-10-CM | POA: Diagnosis not present

## 2022-07-18 NOTE — Progress Notes (Signed)
[name] ([id number]) [id number] Page 1 of 9 Visit Report for 07/17/2022 Chief Complaint Document Details Patient Name: Date of Service: [name], [place] RO L 07/17/2022 10:15 A M Medical Record Number: [id number] Patient Account Number: [id number] Date of Birth/Sex: Treating RN: 06-25-1930 (89 y.o. F) Primary Care Provider: Romeo Rabon Other Clinician: Referring Provider: Treating Provider/Extender: [name] in Treatment: 17 Information Obtained from: Patient Chief Complaint Patient presents for treatment of open ulcers due to venous insufficiency Electronic Signature(s) Signed: 07/17/2022 11:02:16 AM By: Duanne Guess MD FACS Entered By: Duanne Guess on 07/17/2022 11:02:16 -------------------------------------------------------------------------------- Debridement Details Patient Name: Date of Service: [name], [name] 07/17/2022 10:15 A M Medical Record Number: [id number] Patient Account Number: [id number] Date of Birth/Sex: Treating RN: 10/22/1930 (89 y.o. Katrinka Blazing Primary Care Provider: Romeo Rabon Other Clinician: Referring Provider: Treating Provider/Extender: [name] in Treatment: 17 Debridement Performed for Assessment: Wound #12 Left T Fifth oe Performed By: Physician Duanne Guess, MD Debridement Type: Debridement Level of Consciousness (Pre-procedure): Awake and Alert Pre-procedure Verification/Time Out Yes - 10:40 Taken: Start Time: 10:40 Pain Control: Lidocaine 4% Topical Solution Percent of Wound Bed Debrided: 100% T Area Debrided (cm): otal 0.01 Tissue and other material debrided: Non-Viable, Callus, Slough, Slough Level: Non-Viable Tissue Debridement Description: Selective/Open Wound Instrument: Curette Bleeding: Minimum Hemostasis Achieved: Pressure End Time: 10:42 Procedural Pain: 0 Post Procedural Pain: 0 Response to Treatment:  Procedure was tolerated well Level of Consciousness (Post- Awake and Alert procedure): Post Debridement Measurements of Total Wound Length: (cm) 0.1 Width: (cm) 0.1 Depth: (cm) 0.1 Volume: (cm) 0.001 Character of Wound/Ulcer Post Debridement: Improved De [name], [name] ([id number]) [phone].pdf Page 2 of 9 Post Procedure Diagnosis Same as Pre-procedure Notes Scribed for Dr. Lady Gary by J.Scotton Electronic Signature(s) Signed: 07/17/2022 11:16:21 AM By: Duanne Guess MD FACS Signed: 07/18/2022 12:13:42 PM By: Karie Schwalbe RN Entered By: Karie Schwalbe on 07/17/2022 10:46:05 -------------------------------------------------------------------------------- HPI Details Patient Name: Date of Service: [name], [name] 07/17/2022 10:15 A M Medical Record Number: [id number] Patient Account Number: [id number] Date of Birth/Sex: Treating RN: 1930/08/08 (89 y.o. F) Primary Care Provider: Romeo Rabon Other Clinician: Referring Provider: Treating Provider/Extender: [name] in Treatment: 17 History of Present Illness HPI Description: ADMISSION 03/20/2022 This is an 89 year old woman with coronary artery disease, on chronic corticosteroids for osteoarthritis, who presents with bilateral lower leg ulcers. They have been present for a couple of weeks. I do not have any vascular studies available to discern whether or not she has had venous reflux or arterial studies done. ABIs in clinic were 1.34 on the left and 0.95 on the right. She is not diabetic. She does not smoke. Her legs are quite edematous and she has multiple hematomas of various sizes and ages extending up to at least the distal thigh. She has 3 ulcers on the left leg with slough and eschar accumulation. On the right leg, she has a tiny superficial ulcer on the anterior tibial surface. She also has a small ulcer on the lateral aspect of her right third toe. 03/27/2022: The  ulcer on her right anterior tibial surface and lateral third toe are both healed. The ulcers on her left leg are smaller but have slough accumulation. Edema control is [name]. Her juxta lite stockings arrived in the mail, but she did not bring them with her today. 04/06/2022: She has a new skin tear just distal to her left knee. The ulcer on her left upper posterior  calf is nearly healed under a layer of eschar. She has a spot on her right second toe that she is concerned about, saying that she frequently gets an ulcer there, but I do not see any open wound in this site. 04/21/2022: All of her wounds are healed except for the skin tear distal to her left knee. There is a layer of eschar on the surface. Underneath, the wound is quite superficial and clean. 04/19/2022: All of her wounds are healed. She is wearing a juxta lite stocking on her right leg and has brought her other stocking for [place] to apply to her left. 05/22/2022: She returns today with new ulcers on her left leg. A large hematoma/blood blister on her upper lateral leg, just distal to the knee opened up shortly after she was healed out from our clinic. There is slough on the wound surface, but no concern for infection. She has a second ulcer on her anterior tibial surface with slough on the surface. The small site on her right second toe that we have been monitoring has opened. There is some slough in the center with surrounding callus. She has a hematoma/blood blister on the medial aspect of her right lower leg that is not open, but it is certainly threatening to do so. 05/29/2022: The hematoma that had threatened to open last week is now open. There is slough and hematoma and nonviable subcutaneous tissue present. The toe ulcer is down to a small pinhole, but there is some undermining from some dry skin present. The ulcers on her left leg are both smaller. There is some slough accumulation on both. 06/05/2022: The more proximal of the left leg ulcers  is closed by about 50%. There is some eschar on the wound surface. The anterior left leg ulcer is nearly healed with just a little eschar on the remaining open portion. The lateral foot ulcer has some eschar and slough present the most recent right lower leg wound has slough and fat exposed. The right second toe ulcer looks about the same with a layer of callus and slough. 06/13/2022: The anterior left leg ulcer is closed. The proximal left lower leg ulcer is down to just about a centimeter with some slough and eschar on the surface. The right foot ulcer is healed. The right anterior lower leg wound continues to accumulate slough but is a little bit smaller today. The right second toe ulcer is smaller underneath the layer of callus and slough. 06/19/2022: The proximal left lower leg ulcer is down to just a couple of millimeters. It is clean. The right anterior lower leg wound is smaller again today, but still with some slough buildup. The right second toe ulcer is down to just a pinpoint opening underneath some callus. 07/03/2022: The left lower leg ulcer is closed. The right anterior lower leg wound is smaller again this week. There is a little bit of light slough accumulation. The right second toe ulcer is unchanged underneath its layer of callus. She is also complaining of pain related to a callus in her midfoot as well as one on her fifth toe. 07/10/2022: The right anterior lower leg wound is nearly closed underneath the layer of eschar. The right second toe ulcer appears to have healed. The fifth toe callus on her left foot has reaccumulated and she says it is causing her pain. After debridement, I discovered an ulcer at the base of this callus. 07/17/2022: The right anterior lower leg wound is closed. The right second  toe ulcer remains closed. The fifth toe ulcer on her left foot has reaccumulated some callus. The small wound remains open with some slough buildup. JAMAREA, TIGNER ([id number])  [id number] Page 3 of 9 Electronic Signature(s) Signed: 07/17/2022 11:03:09 AM By: Duanne Guess MD FACS Entered By: Duanne Guess on 07/17/2022 11:03:09 -------------------------------------------------------------------------------- Physical Exam Details Patient Name: Date of Service: [name], [name] 07/17/2022 10:15 A M Medical Record Number: [id number] Patient Account Number: [id number] Date of Birth/Sex: Treating RN: [birth year] (89 y.o. F) Primary Care Provider: Romeo Rabon Other Clinician: Referring Provider: Treating Provider/Extender: [name] in Treatment: 17 Constitutional . . . . no acute distress. Respiratory Normal work of breathing on room air. Notes 07/17/2022: The right anterior lower leg wound is closed. The right second toe ulcer remains closed. The fifth toe ulcer on her left foot has reaccumulated some callus. The small wound remains open with some slough buildup. Electronic Signature(s) Signed: 07/17/2022 11:04:08 AM By: Duanne Guess MD FACS Entered By: Duanne Guess on 07/17/2022 11:04:08 -------------------------------------------------------------------------------- Physician Orders Details Patient Name: Date of Service: [name], [name] 07/17/2022 10:15 A M Medical Record Number: [id number] Patient Account Number: [id number] Date of Birth/Sex: Treating RN: 05-04-1930 (89 y.o. Katrinka Blazing Primary Care Provider: Romeo Rabon Other Clinician: Referring Provider: Treating Provider/Extender: [name] in Treatment: 17 Verbal / Phone Orders: No Diagnosis Coding ICD-10 Coding Code Description [phone] Non-pressure chronic ulcer of other part of left foot with fat layer exposed R60.0 Localized edema I87.2 Venous insufficiency (chronic) (peripheral) Z79.52 Long term (current) use of systemic steroids L84 Corns and callosities Follow-up  Appointments ppointment in 2 weeks. - Dr. Lady Gary - Room 3 Return A Friday June 28th at 9:30am Bathing/ Shower/ Hygiene May shower with protection but do not get wound dressing(s) wet. Protect dressing(s) with water repellant cover (for example, large plastic bag) or a cast cover and may then take shower. Edema Control - Lymphedema / SCD / Other Elevate legs to the level of the heart or above for 30 minutes daily and/or when sitting for 3-4 times a day throughout the day. Linda, [name] ([id number]) [id number] Page 4 of 9 Avoid standing for long periods of time. Exercise regularly Moisturize legs daily. - left leg daily Compression stocking or Garment 20-30 mm/Hg pressure to: - juxtalite to left leg daily Wound Treatment Wound #12 - T Fifth oe Wound Laterality: Left Cleanser: Soap and Water Every Other Day/30 Days Discharge Instructions: May shower and wash wound with dial antibacterial soap and water prior to dressing change. Cleanser: Wound Cleanser Every Other Day/30 Days Discharge Instructions: Cleanse the wound with wound cleanser prior to applying a clean dressing using gauze sponges, not tissue or cotton balls. Prim Dressing: Maxorb Extra Ag+ Alginate Dressing, 2x2 (in/in) Every Other Day/30 Days ary Discharge Instructions: Apply to wound bed as instructed Prim Dressing: Optifoam Non-Adhesive Dressing, 4x4 in Every Other Day/30 Days ary Discharge Instructions: Apply to wound bed to pad toe Secondary Dressing: Bordered Gauze, 2x2 in Every Other Day/30 Days Discharge Instructions: Apply over primary dressing as directed. Electronic Signature(s) Signed: 07/17/2022 11:16:21 AM By: Duanne Guess MD FACS Entered By: Duanne Guess on 07/17/2022 11:04:18 -------------------------------------------------------------------------------- Problem List Details Patient Name: Date of Service: [name], [name] 07/17/2022 10:15 A M Medical Record Number:  [id number] Patient Account Number: [id number] Date of Birth/Sex: Treating RN: 07/06/1930 (89 y.o. F) Primary Care Provider: Romeo Rabon Other Clinician: Referring Provider: Treating Provider/Extender: [name]  in Treatment: 17 Active Problems ICD-10 Encounter Code Description Active Date MDM Diagnosis L97.522 Non-pressure chronic ulcer of other part of left foot with fat layer exposed 07/10/2022 No Yes R60.0 Localized edema 03/20/2022 No Yes I87.2 Venous insufficiency (chronic) (peripheral) 03/20/2022 No Yes Z79.52 Long term (current) use of systemic steroids 03/20/2022 No Yes L84 Corns and callosities 06/05/2022 No Yes Inactive Problems Resolved Problems TYNIECE, LEIVA ([id number]) [id number] Page 5 of 9 ICD-10 Code Description Active Date Resolved Date L97.811 Non-pressure chronic ulcer of other part of right lower leg limited to breakdown of skin 03/20/2022 03/20/2022 Y86.578 Non-pressure chronic ulcer of other part of left lower leg with fat layer exposed 05/22/2022 03/20/2022 L97.511 Non-pressure chronic ulcer of other part of right foot limited to breakdown of skin 03/20/2022 03/20/2022 L97.512 Non-pressure chronic ulcer of other part of right foot with fat layer exposed 05/22/2022 05/22/2022 L97.812 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ed 05/29/2022 05/29/2022 Electronic Signature(s) Signed: 07/17/2022 11:01:52 AM By: Duanne Guess MD FACS Entered By: Duanne Guess on 07/17/2022 11:01:52 -------------------------------------------------------------------------------- Progress Note Details Patient Name: Date of Service: [name], [name] 07/17/2022 10:15 A M Medical Record Number: [id number] Patient Account Number: [id number] Date of Birth/Sex: Treating RN: [birth year] (89 y.o. F) Primary Care Provider: Romeo Rabon Other Clinician: Referring Provider: Treating Provider/Extender:  [name] in Treatment: 17 Subjective Chief Complaint Information obtained from Patient Patient presents for treatment of open ulcers due to venous insufficiency History of Present Illness (HPI) ADMISSION 03/20/2022 This is an 89 year old woman with coronary artery disease, on chronic corticosteroids for osteoarthritis, who presents with bilateral lower leg ulcers. They have been present for a couple of weeks. I do not have any vascular studies available to discern whether or not she has had venous reflux or arterial studies done. ABIs in clinic were 1.34 on the left and 0.95 on the right. She is not diabetic. She does not smoke. Her legs are quite edematous and she has multiple hematomas of various sizes and ages extending up to at least the distal thigh. She has 3 ulcers on the left leg with slough and eschar accumulation. On the right leg, she has a tiny superficial ulcer on the anterior tibial surface. She also has a small ulcer on the lateral aspect of her right third toe. 03/27/2022: The ulcer on her right anterior tibial surface and lateral third toe are both healed. The ulcers on her left leg are smaller but have slough accumulation. Edema control is [name]. Her juxta lite stockings arrived in the mail, but she did not bring them with her today. 04/06/2022: She has a new skin tear just distal to her left knee. The ulcer on her left upper posterior calf is nearly healed under a layer of eschar. She has a spot on her right second toe that she is concerned about, saying that she frequently gets an ulcer there, but I do not see any open wound in this site. 04/21/2022: All of her wounds are healed except for the skin tear distal to her left knee. There is a layer of eschar on the surface. Underneath, the wound is quite superficial and clean. 04/19/2022: All of her wounds are healed. She is wearing a juxta lite stocking on her right leg and has brought her other stocking  for [place] to apply to her left. 05/22/2022: She returns today with new ulcers on her left leg. A large hematoma/blood blister on her upper lateral leg,  just distal to the knee opened up shortly after she was healed out from our clinic. There is slough on the wound surface, but no concern for infection. She has a second ulcer on her anterior tibial surface with slough on the surface. The small site on her right second toe that we have been monitoring has opened. There is some slough in the center with surrounding callus. She has a hematoma/blood blister on the medial aspect of her right lower leg that is not open, but it is certainly threatening to do so. 05/29/2022: The hematoma that had threatened to open last week is now open. There is slough and hematoma and nonviable subcutaneous tissue present. The toe ulcer is down to a small pinhole, but there is some undermining from some dry skin present. The ulcers on her left leg are both smaller. There is some slough accumulation on both. 06/05/2022: The more proximal of the left leg ulcers is closed by about 50%. There is some eschar on the wound surface. The anterior left leg ulcer is nearly healed with just a little eschar on the remaining open portion. The lateral foot ulcer has some eschar and slough present the most recent right lower leg wound has slough and fat exposed. The right second toe ulcer looks about the same with a layer of callus and slough. Linda, [name] ([id number]) [id number] Page 6 of 9 06/13/2022: The anterior left leg ulcer is closed. The proximal left lower leg ulcer is down to just about a centimeter with some slough and eschar on the surface. The right foot ulcer is healed. The right anterior lower leg wound continues to accumulate slough but is a little bit smaller today. The right second toe ulcer is smaller underneath the layer of callus and slough. 06/19/2022: The proximal left lower leg ulcer is down to  just a couple of millimeters. It is clean. The right anterior lower leg wound is smaller again today, but still with some slough buildup. The right second toe ulcer is down to just a pinpoint opening underneath some callus. 07/03/2022: The left lower leg ulcer is closed. The right anterior lower leg wound is smaller again this week. There is a little bit of light slough accumulation. The right second toe ulcer is unchanged underneath its layer of callus. She is also complaining of pain related to a callus in her midfoot as well as one on her fifth toe. 07/10/2022: The right anterior lower leg wound is nearly closed underneath the layer of eschar. The right second toe ulcer appears to have healed. The fifth toe callus on her left foot has reaccumulated and she says it is causing her pain. After debridement, I discovered an ulcer at the base of this callus. 07/17/2022: The right anterior lower leg wound is closed. The right second toe ulcer remains closed. The fifth toe ulcer on her left foot has reaccumulated some callus. The small wound remains open with some slough buildup. Patient History Information obtained from Patient. Family History Heart Disease - Father, Hypertension - Mother, No family history of Cancer, Diabetes, Hereditary Spherocytosis, Kidney Disease, Lung Disease, Seizures, Stroke, Thyroid Problems, Tuberculosis. Social History Never smoker, Marital Status - Married, Alcohol Use - Rarely, Drug Use - No History, Caffeine Use - Moderate. Medical History Eyes Patient has history of Cataracts Hematologic/Lymphatic Patient has history of Lymphedema Respiratory Patient has history of Sleep Apnea Cardiovascular Patient has history of Coronary Artery Disease, Hypertension Gastrointestinal Patient has history of Hepatitis B - 50+ yrs ago  Musculoskeletal Patient has history of Osteoarthritis Hospitalization/Surgery History - abdominal hysterectomy. - cardiac catheterization. - cataract  extraction bilat.. - colonoscopy. - bilateral total knee replacement. - upper GI endoscopy. Medical A Surgical History Notes nd Gastrointestinal diverticulosis, GERD Musculoskeletal osteoporosis Objective Constitutional no acute distress. Vitals Time Taken: 10:22 AM, Height: 68 in, Weight: 180 lbs, BMI: 27.4, Temperature: 97.9 F, Pulse: 62 bpm, Respiratory Rate: 18 breaths/min, Blood Pressure: 108/67 mmHg. Respiratory Normal work of breathing on room air. General Notes: 07/17/2022: The right anterior lower leg wound is closed. The right second toe ulcer remains closed. The fifth toe ulcer on her left foot has reaccumulated some callus. The small wound remains open with some slough buildup. Integumentary (Hair, Skin) Wound #10 status is Healed - Epithelialized. Original cause of wound was Gradually Appeared. The date acquired was: 05/29/2022. The wound has been in treatment 7 weeks. The wound is located on the Right,Medial Lower Leg. The wound measures 0cm length x 0cm width x 0cm depth; 0cm^2 area and 0cm^3 volume. There is no tunneling or undermining noted. There is a none present amount of drainage noted. The wound margin is flat and intact. There is no granulation within the wound bed. There is no necrotic tissue within the wound bed. The periwound skin appearance had no abnormalities noted for texture. The periwound skin appearance had no abnormalities noted for moisture. The periwound skin appearance had no abnormalities noted for color. Periwound temperature was noted as No Abnormality. Wound #12 status is Open. Original cause of wound was Gradually Appeared. The date acquired was: 07/10/2022. The wound has been in treatment 1 weeks. The wound is located on the Left T Fifth. The wound measures 0.1cm length x 0.1cm width x 0.1cm depth; 0.008cm^2 area and 0.001cm^3 volume. There is oe Fat Layer (Subcutaneous Tissue) exposed. There is no tunneling or undermining noted. There is a medium  amount of serosanguineous drainage noted. There is no granulation within the wound bed. There is a large (67-100%) amount of necrotic tissue within the wound bed including Eschar and Adherent Slough. The periwound skin appearance had no abnormalities noted for moisture. The periwound skin appearance had no abnormalities noted for color. The periwound skin appearance exhibited: Callus. Periwound temperature was noted as No Abnormality. Linda, [name] ([id number]) [id number] Page 7 of 9 Assessment Active Problems ICD-10 Non-pressure chronic ulcer of other part of left foot with fat layer exposed Localized edema Venous insufficiency (chronic) (peripheral) Long term (current) use of systemic steroids Corns and callosities Procedures Wound #12 Pre-procedure diagnosis of Wound #12 is a Lymphedema located on the Left T Fifth . There was a Selective/Open Wound Non-Viable Tissue Debridement oe with a total area of 0.01 sq cm performed by Duanne Guess, MD. With the following instrument(s): Curette to remove Non-Viable tissue/material. Material removed includes Callus and Slough and after achieving pain control using Lidocaine 4% T opical Solution. No specimens were taken. A time out was conducted at 10:40, prior to the start of the procedure. A Minimum amount of bleeding was controlled with Pressure. The procedure was tolerated well with a pain level of 0 throughout and a pain level of 0 following the procedure. Post Debridement Measurements: 0.1cm length x 0.1cm width x 0.1cm depth; 0.001cm^3 volume. Character of Wound/Ulcer Post Debridement is improved. Post procedure Diagnosis Wound #12: Same as Pre-Procedure General Notes: Scribed for Dr. Lady Gary by J.Scotton. Plan Follow-up Appointments: Return Appointment in 2 weeks. - Dr. Lady Gary - Room 3 Friday June 28th at 9:30am Bathing/ Shower/ Hygiene: May  shower with protection but do not get wound dressing(s) wet. Protect  dressing(s) with water repellant cover (for example, large plastic bag) or a cast cover and may then take shower. Edema Control - Lymphedema / SCD / Other: Elevate legs to the level of the heart or above for 30 minutes daily and/or when sitting for 3-4 times a day throughout the day. Avoid standing for long periods of time. Exercise regularly Moisturize legs daily. - left leg daily Compression stocking or Garment 20-30 mm/Hg pressure to: - juxtalite to left leg daily WOUND #12: - T Fifth Wound Laterality: Left oe Cleanser: Soap and Water Every Other Day/30 Days Discharge Instructions: May shower and wash wound with dial antibacterial soap and water prior to dressing change. Cleanser: Wound Cleanser Every Other Day/30 Days Discharge Instructions: Cleanse the wound with wound cleanser prior to applying a clean dressing using gauze sponges, not tissue or cotton balls. Prim Dressing: Maxorb Extra Ag+ Alginate Dressing, 2x2 (in/in) Every Other Day/30 Days ary Discharge Instructions: Apply to wound bed as instructed Prim Dressing: Optifoam Non-Adhesive Dressing, 4x4 in Every Other Day/30 Days ary Discharge Instructions: Apply to wound bed to pad toe Secondary Dressing: Bordered Gauze, 2x2 in Every Other Day/30 Days Discharge Instructions: Apply over primary dressing as directed. 07/17/2022: The right anterior lower leg wound is closed. The right second toe ulcer remains closed. The fifth toe ulcer on her left foot has reaccumulated some callus. The small wound remains open with some slough buildup. I used a curette to debride callus and slough from the fifth toe ulcer on her left foot. We will continue silver alginate and a foam donut here. She does have her juxta lite stockings with her and so we helped her apply them. She will follow-up in 1 week. Electronic Signature(s) Signed: 07/17/2022 11:04:52 AM By: Duanne Guess MD FACS Entered By: Duanne Guess on 07/17/2022 11:04:52 [name] ([id number]) [id number] Page 8 of 9 -------------------------------------------------------------------------------- HxROS Details Patient Name: Date of Service: [name], [place] RO L 07/17/2022 10:15 A M Medical Record Number: [id number] Patient Account Number: [id number] Date of Birth/Sex: Treating RN: 1931/01/13 (89 y.o. F) Primary Care Provider: Romeo Rabon Other Clinician: Referring Provider: Treating Provider/Extender: [name] in Treatment: 17 Information Obtained From Patient Eyes Medical History: Positive for: Cataracts Hematologic/Lymphatic Medical History: Positive for: Lymphedema Respiratory Medical History: Positive for: Sleep Apnea Cardiovascular Medical History: Positive for: Coronary Artery Disease; Hypertension Gastrointestinal Medical History: Positive for: Hepatitis B - 50+ yrs ago Past Medical History Notes: diverticulosis, GERD Musculoskeletal Medical History: Positive for: Osteoarthritis Past Medical History Notes: osteoporosis HBO Extended History Items Eyes: Cataracts Immunizations Pneumococcal Vaccine: Received Pneumococcal Vaccination: Yes Received Pneumococcal Vaccination On or After 60th Birthday: Yes Implantable Devices No devices added Hospitalization / Surgery History Type of Hospitalization/Surgery abdominal hysterectomy cardiac catheterization cataract extraction bilat. colonoscopy bilateral total knee replacement upper GI endoscopy Family and Social History Cancer: No; Diabetes: No; Heart Disease: Yes - Father; Hereditary Spherocytosis: No; Hypertension: Yes - Mother; Kidney Disease: No; Lung Disease: No; Seizures: No; Stroke: No; Thyroid Problems: No; Tuberculosis: No; Never smoker; Marital Status - Married; Alcohol Use: Rarely; Drug Use: No History; Caffeine Use: Moderate; Financial Concerns: No; Food, Clothing or Shelter Needs: No; Support System Lacking: No;  Transportation Concerns: No Electronic Signature(s) Signed: 07/17/2022 11:16:21 AM By: Duanne Guess MD FACS Willard, [name] ([id number]) [id number] Page 9 of 9 Entered By: Duanne Guess on 07/17/2022 11:03:50 -------------------------------------------------------------------------------- SuperBill Details Patient Name: Date of Service: [name], [place] RO  L 07/17/2022 Medical Record Number: [id number] Patient Account Number: [id number] Date of Birth/Sex: Treating RN: 06-02-1930 (89 y.o. F) Primary Care Provider: Romeo Rabon Other Clinician: Referring Provider: Treating Provider/Extender: [name] in Treatment: 17 Diagnosis Coding ICD-10 Codes Code Description [phone] Non-pressure chronic ulcer of other part of left foot with fat layer exposed R60.0 Localized edema I87.2 Venous insufficiency (chronic) (peripheral) Z79.52 Long term (current) use of systemic steroids L84 Corns and callosities Facility Procedures : CPT4 Code: [id number] Description: 97597 - DEBRIDE WOUND 1ST 20 SQ CM OR < ICD-10 Diagnosis Description L97.522 Non-pressure chronic ulcer of other part of left foot with fat layer exposed Modifier: Quantity: 1 Physician Procedures : CPT4 Code Description Modifier [id number] 99213 - WC PHYS LEVEL 3 - EST PT 25 ICD-10 Diagnosis Description L97.522 Non-pressure chronic ulcer of other part of left foot with fat layer exposed R60.0 Localized edema I87.2 Venous insufficiency (chronic)  (peripheral) Z79.52 Long term (current) use of systemic steroids Quantity: 1 : [id number] 97597 - WC PHYS DEBR WO ANESTH 20 SQ CM ICD-10 Diagnosis Description L97.522 Non-pressure chronic ulcer of other part of left foot with fat layer exposed Quantity: 1 Electronic Signature(s) Signed: 07/17/2022 11:05:08 AM By: Duanne Guess MD FACS Entered By: Duanne Guess on 07/17/2022 11:05:08

## 2022-07-18 NOTE — Progress Notes (Signed)
Champ, [name] ([id number]) [id number] Page 1 of 9 Visit Report for 07/17/2022 Arrival Information Details Patient Name: Date of Service: [name], [place] RO L 07/17/2022 10:15 A M Medical Record Number: [id number] Patient Account Number: [id number] Date of Birth/Sex: Treating RN: [birth year] (89 y.o. Katrinka Blazing Primary Care Joshva Labreck: Romeo Rabon Other Clinician: Referring Atonya Templer: Treating Fatisha Rabalais/Extender: [name] in Treatment: 17 Visit Information History Since Last Visit Added or deleted any medications: No Patient Arrived: [name] Any new allergies or adverse reactions: No Arrival Time: 10:20 Had a fall or experienced change in No Accompanied By: spouse activities of daily living that may affect Transfer Assistance: Manual risk of falls: Patient Requires Transmission-Based Precautions: No Signs or symptoms of abuse/neglect since last visito No Patient Has Alerts: No Hospitalized since last visit: No Implantable device outside of the clinic excluding No cellular tissue based products placed in the center since last visit: Has Dressing in Place as Prescribed: Yes Has Compression in Place as Prescribed: Yes Pain Present Now: Yes Electronic Signature(s) Signed: 07/18/2022 12:13:42 PM By: Karie Schwalbe RN Entered By: Karie Schwalbe on 07/17/2022 10:27:00 -------------------------------------------------------------------------------- Encounter Discharge Information Details Patient Name: Date of Service: [name], [name] 07/17/2022 10:15 A M Medical Record Number: [id number] Patient Account Number: [id number] Date of Birth/Sex: Treating RN: 09/26/1930 (89 y.o. Katrinka Blazing Primary Care Sandor Arboleda: Romeo Rabon Other Clinician: Referring Aland Chestnutt: Treating Dawnielle Christiana/Extender: [name] in Treatment: 17 Encounter Discharge Information Items Post Procedure Vitals Discharge  Condition: Stable Temperature (F): 97.9 Ambulatory Status: Walker Pulse (bpm): 62 Discharge Destination: Home Respiratory Rate (breaths/min): 18 Transportation: Private Auto Blood Pressure (mmHg): 108/67 Accompanied By: spouse Schedule Follow-up Appointment: Yes Clinical Summary of Care: Patient Declined Electronic Signature(s) Signed: 07/18/2022 12:13:42 PM By: Karie Schwalbe RN Entered By: Karie Schwalbe on 07/17/2022 15:10:13 [place], [name] ([id number]) [id number] Page 2 of 9 -------------------------------------------------------------------------------- Lower Extremity Assessment Details Patient Name: Date of Service: [name],  RO L 07/17/2022 10:15 A M Medical Record Number: [id number] Patient Account Number: [id number] Date of Birth/Sex: Treating RN: 1930-09-27 (89 y.o. Katrinka Blazing Primary Care Lamari Youngers: Romeo Rabon Other Clinician: Referring Arn Mcomber: Treating Saifullah Jolley/Extender: [name] in Treatment: 17 Edema Assessment Assessed: Kyra Searles: No] Franne Forts: No] Edema: [Left: Yes] [Right: Yes] Calf Left: Right: Point of Measurement: From Medial Instep 30.5 cm 30 cm Ankle Left: Right: Point of Measurement: From Medial Instep 20 cm 20 cm Vascular Assessment Pulses: Dorsalis Pedis Palpable: [Left:Yes] [Right:Yes] Electronic Signature(s) Signed: 07/18/2022 12:13:42 PM By: Karie Schwalbe RN Entered By: Karie Schwalbe on 07/17/2022 10:38:59 -------------------------------------------------------------------------------- Multi Wound Chart Details Patient Name: Date of Service: [name], [name] 07/17/2022 10:15 A M Medical Record Number: [id number] Patient Account Number: [id number] Date of Birth/Sex: Treating RN: 1930-04-08 (89 y.o. F) Primary Care Cariah Salatino: Romeo Rabon Other Clinician: Referring Ilyssa Grennan: Treating Rush Salce/Extender: [name] in Treatment: 17 Vital  Signs Height(in): 68 Pulse(bpm): 62 Weight(lbs): 180 Blood Pressure(mmHg): 108/67 Body Mass Index(BMI): 27.4 Temperature(F): 97.9 Respiratory Rate(breaths/min): 18 [10:Photos:] [N/A:N/A [id number] Page 3 of 9] Right, Medial Lower Leg Left T Fifth oe N/A Wound Location: Gradually Appeared Gradually Appeared N/A Wounding Event: Venous Leg Ulcer Lymphedema N/A Primary Etiology: Cataracts, Lymphedema, Sleep Cataracts, Lymphedema, Sleep N/A Comorbid History: Apnea, Coronary Artery Disease, Apnea, Coronary Artery Disease, Hypertension, Hepatitis B, Hypertension, Hepatitis B, Osteoarthritis Osteoarthritis 05/29/2022 07/10/2022 N/A Date Acquired: 7 1 N/A Weeks of Treatment: Healed - Epithelialized Open N/A Wound Status: No No N/A Wound Recurrence: 0x0x0 0.1x0.1x0.1  N/A Measurements L x W x D (cm) 0 0.008 N/A A (cm) : rea 0 0.001 N/A Volume (cm) : 100.00% 74.20% N/A % Reduction in A rea: 100.00% 66.70% N/A % Reduction in Volume: Full Thickness Without Exposed Full Thickness Without Exposed N/A Classification: Support Structures Support Structures None Present Medium N/A Exudate A mount: N/A Serosanguineous N/A Exudate Type: N/A red, brown N/A Exudate Color: Flat and Intact N/A N/A Wound Margin: None Present (0%) None Present (0%) N/A Granulation A mount: None Present (0%) Large (67-100%) N/A Necrotic A mount: N/A Eschar, Adherent Slough N/A Necrotic Tissue: Fascia: No Fat Layer (Subcutaneous Tissue): Yes N/A Exposed Structures: Fat Layer (Subcutaneous Tissue): No Fascia: No Tendon: No Tendon: No Muscle: No Muscle: No Joint: No Joint: No Bone: No Bone: No Large (67-100%) Large (67-100%) N/A Epithelialization: N/A Debridement - Selective/Open Wound N/A Debridement: Pre-procedure Verification/Time Out N/A 10:40 N/A Taken: N/A Lidocaine 4% T opical Solution N/A Pain Control: N/A Callus, Slough N/A Tissue Debrided: N/A  Non-Viable Tissue N/A Level: N/A 0.01 N/A Debridement A (sq cm): rea N/A Curette N/A Instrument: N/A Minimum N/A Bleeding: N/A Pressure N/A Hemostasis A chieved: N/A 0 N/A Procedural Pain: N/A 0 N/A Post Procedural Pain: N/A Procedure was tolerated well N/A Debridement Treatment Response: N/A 0.1x0.1x0.1 N/A Post Debridement Measurements L x W x D (cm) N/A 0.001 N/A Post Debridement Volume: (cm) No Abnormalities Noted Callus: Yes N/A Periwound Skin Texture: No Abnormalities Noted No Abnormalities Noted N/A Periwound Skin Moisture: No Abnormalities Noted No Abnormalities Noted N/A Periwound Skin Color: No Abnormality No Abnormality N/A Temperature: N/A Debridement N/A Procedures Performed: Treatment Notes Electronic Signature(s) Signed: 07/17/2022 11:01:58 AM By: Duanne Guess MD FACS Entered By: Duanne Guess on 07/17/2022 11:01:57 -------------------------------------------------------------------------------- Multi-Disciplinary Care Plan Details Patient Name: Date of Service: [name], [name] 07/17/2022 10:15 A M Medical Record Number: [id number] Patient Account Number: [id number] Date of Birth/Sex: Treating RN: 10-17-1930 (89 y.o. Katrinka Blazing Primary Care Tarin Johndrow: Romeo Rabon Other Clinician: Referring Britten Parady: Treating Makynzi Eastland/Extender: [name] in Treatment: 17 Multidisciplinary Care Plan reviewed with physician Preston, [name] ([id number]) [id number] Page 4 of 9 Active Inactive Abuse / Safety / Falls / Self Care Management Nursing Diagnoses: History of Falls Impaired physical mobility Potential for falls Goals: Patient will not experience any injury related to falls Date Initiated: 03/20/2022 Date Inactivated: 05/29/2022 Target Resolution Date: 05/05/2022 Goal Status: Met Patient/caregiver will demonstrate safe use of adaptive devices to increase mobility Date Initiated:  03/20/2022 Date Inactivated: 04/27/2022 Target Resolution Date: 05/05/2022 Goal Status: Met Patient/caregiver will verbalize/demonstrate measures taken to prevent injury and/or falls Date Initiated: 05/29/2022 Target Resolution Date: 08/30/2022 Goal Status: Active Interventions: Assess: immobility, friction, shearing, incontinence upon admission and as needed Provide education on fall prevention Notes: Venous Leg Ulcer Nursing Diagnoses: Knowledge deficit related to disease process and management Potential for venous Insuffiency (use before diagnosis confirmed) Goals: Patient will maintain optimal edema control Date Initiated: 05/22/2022 Target Resolution Date: 08/30/2022 Goal Status: Active Interventions: Assess peripheral edema status every visit. Compression as ordered Provide education on venous insufficiency Treatment Activities: Therapeutic compression applied : 05/22/2022 Notes: Wound/Skin Impairment Nursing Diagnoses: Impaired tissue integrity Knowledge deficit related to ulceration/compromised skin integrity Goals: Patient/caregiver will verbalize understanding of skin care regimen Date Initiated: 03/20/2022 Target Resolution Date: 08/30/2022 Goal Status: Active Interventions: Assess ulceration(s) every visit Treatment Activities: Skin care regimen initiated : 03/20/2022 Topical wound management initiated : 03/20/2022 Notes: Electronic Signature(s) Signed: 07/18/2022 12:13:42 PM By: Karie Schwalbe RN Entered By:  Karie Schwalbe on 07/17/2022 15:08:45 [place], [name] ([id number]) [id number] Page 5 of 9 -------------------------------------------------------------------------------- Pain Assessment Details Patient Name: Date of Service: [name], [place] RO L 07/17/2022 10:15 A M Medical Record Number: [id number] Patient Account Number: [id number] Date of Birth/Sex: Treating RN: 01/09/1931 (89 y.o. Katrinka Blazing Primary Care Malyk Girouard: Romeo Rabon Other Clinician: Referring Fatimata Talsma: Treating Keifer Habib/Extender: [name] in Treatment: 17 Active Problems Location of Pain Severity and Description of Pain Patient Has Paino Yes Site Locations Pain Location: Generalized Pain With Dressing Change: No Duration of the Pain. Constant / Intermittento Constant Rate the pain. Current Pain Level: 1 Worst Pain Level: 10 Least Pain Level: 1 Tolerable Pain Level: 4 Character of Pain Describe the Pain: Aching Pain Management and Medication Current Pain Management: Medication: Yes Cold Application: No Rest: Yes Massage: No Activity: No T.E.N.S.: No Heat Application: No Leg drop or elevation: No Is the Current Pain Management Adequate: Adequate How does your wound impact your activities of daily livingo Sleep: No Bathing: No Appetite: No Relationship With Others: No Bladder Continence: No Emotions: No Bowel Continence: No Work: No Toileting: No Drive: No Dressing: No Hobbies: No Electronic Signature(s) Signed: 07/18/2022 12:13:42 PM By: Karie Schwalbe RN Entered By: Karie Schwalbe on 07/17/2022 10:28:49 -------------------------------------------------------------------------------- Patient/Caregiver Education Details Patient Name: Date of Service: [name], [name] 6/17/2024andnbsp10:15 A M Medical Record Number: [id number] Patient Account Number: [id number] Date of Birth/Gender: Treating RN: 27-May-1930 (89 y[address]o. [address] [place], [place] ([id number]) [id number] Page 6 of 9 Primary Care Physician: Romeo Rabon Other Clinician: Referring Physician: Treating Physician/Extender: [name] in Treatment: 17 Education Assessment Education Provided To: Patient Education Topics Provided Wound/Skin Impairment: Methods: Explain/Verbal Responses: Return demonstration correctly Electronic Signature(s) Signed: 07/18/2022  12:13:42 PM By: Karie Schwalbe RN Entered By: Karie Schwalbe on 07/17/2022 15:09:01 -------------------------------------------------------------------------------- Wound Assessment Details Patient Name: Date of Service: [name], [name] 07/17/2022 10:15 A M Medical Record Number: [id number] Patient Account Number: [id number] Date of Birth/Sex: Treating RN: 1930/02/21 (89 y.o. Katrinka Blazing Primary Care Johnnette Laux: Romeo Rabon Other Clinician: Referring Lilya Smitherman: Treating Kionte Baumgardner/Extender: [name] in Treatment: 17 Wound Status Wound Number: 10 Primary Venous Leg Ulcer Etiology: Wound Location: Right, Medial Lower Leg Wound Healed - Epithelialized Wounding Event: Gradually Appeared Status: Date Acquired: 05/29/2022 Comorbid Cataracts, Lymphedema, Sleep Apnea, Coronary Artery Disease, Weeks Of Treatment: 7 History: Hypertension, Hepatitis B, Osteoarthritis Clustered Wound: No Photos Wound Measurements Length: (cm) Width: (cm) Depth: (cm) Area: (cm) Volume: (cm) 0 % Reduction in Area: 100% 0 % Reduction in Volume: 100% 0 Epithelialization: Large (67-100%) 0 Tunneling: No 0 Undermining: No Wound Description Classification: Full Thickness Without Exposed Suppor Wound Margin: Flat and Intact Exudate Amount: None Present t Structures Foul Odor After Cleansing: No Slough/Fibrino No Wound Bed [place], [name] ([id number]) [id number] Page 7 of 9 Granulation Amount: None Present (0%) Exposed Structure Necrotic Amount: None Present (0%) Fascia Exposed: No Fat Layer (Subcutaneous Tissue) Exposed: No Tendon Exposed: No Muscle Exposed: No Joint Exposed: No Bone Exposed: No Periwound Skin Texture Texture Color No Abnormalities Noted: Yes No Abnormalities Noted: Yes Moisture Temperature / Pain No Abnormalities Noted: Yes Temperature: No Abnormality Electronic Signature(s) Signed: 07/18/2022 12:13:42 PM By:  Karie Schwalbe RN Entered By: Karie Schwalbe on 07/17/2022 10:47:13 -------------------------------------------------------------------------------- Wound Assessment Details Patient Name: Date of Service: [name], [name] 07/17/2022 10:15 A M Medical Record Number: [id number] Patient Account Number: [id number] Date of Birth/Sex: Treating RN: 01-04-1931 (89 y.o.  Katrinka Blazing Primary Care Kjersti Dittmer: Romeo Rabon Other Clinician: Referring Yoltzin Barg: Treating Angenette Daily/Extender: [name] in Treatment: 17 Wound Status Wound Number: 12 Primary Lymphedema Etiology: Wound Location: Left T Fifth oe Wound Open Wounding Event: Gradually Appeared Status: Date Acquired: 07/10/2022 Comorbid Cataracts, Lymphedema, Sleep Apnea, Coronary Artery Disease, Weeks Of Treatment: 1 History: Hypertension, Hepatitis B, Osteoarthritis Clustered Wound: No Photos Wound Measurements Length: (cm) 0.1 Width: (cm) 0.1 Depth: (cm) 0.1 Area: (cm) 0.008 Volume: (cm) 0.001 % Reduction in Area: 74.2% % Reduction in Volume: 66.7% Epithelialization: Large (67-100%) Tunneling: No Undermining: No Wound Description Classification: Full Thickness Without Exposed Support Exudate Amount: Medium Exudate Type: Serosanguineous Exudate Color: red, brown Structures Foul Odor After Cleansing: No Slough/Fibrino No Wound Bed Granulation Amount: None Present (0%) Exposed Structure Necrotic Amount: Large (67-100%) Fascia Exposed: No Sookram, Zoraya ([id number]) [id number] Page 8 of 9 Necrotic Quality: Eschar, Adherent Slough Fat Layer (Subcutaneous Tissue) Exposed: Yes Tendon Exposed: No Muscle Exposed: No Joint Exposed: No Bone Exposed: No Periwound Skin Texture Texture Color No Abnormalities Noted: No No Abnormalities Noted: Yes Callus: Yes Temperature / Pain Temperature: No Abnormality Moisture No Abnormalities Noted: Yes Treatment Notes Wound  #12 (Toe Fifth) Wound Laterality: Left Cleanser Soap and Water Discharge Instruction: May shower and wash wound with dial antibacterial soap and water prior to dressing change. Wound Cleanser Discharge Instruction: Cleanse the wound with wound cleanser prior to applying a clean dressing using gauze sponges, not tissue or cotton balls. Peri-Wound Care Topical Primary Dressing Maxorb Extra Ag+ Alginate Dressing, 2x2 (in/in) Discharge Instruction: Apply to wound bed as instructed Optifoam Non-Adhesive Dressing, 4x4 in Discharge Instruction: Apply to wound bed to pad toe Secondary Dressing Bordered Gauze, 2x2 in Discharge Instruction: Apply over primary dressing as directed. Secured With Compression Wrap Compression Stockings Facilities manager) Signed: 07/18/2022 12:13:42 PM By: Karie Schwalbe RN Entered By: Karie Schwalbe on 07/17/2022 10:47:28 -------------------------------------------------------------------------------- Vitals Details Patient Name: Date of Service: [name], [name] 07/17/2022 10:15 A M Medical Record Number: [id number] Patient Account Number: [id number] Date of Birth/Sex: Treating RN: Dec 01, 1930 (89 y.o. Katrinka Blazing Primary Care Azrael Maddix: Romeo Rabon Other Clinician: Referring Beuna Bolding: Treating Omaira Mellen/Extender: [name] in Treatment: 17 Vital Signs Time Taken: 10:22 Temperature (F): 97.9 Height (in): 68 Pulse (bpm): 62 Weight (lbs): 180 Respiratory Rate (breaths/min): 18 Body Mass Index (BMI): 27.4 Blood Pressure (mmHg): 108/67 Reference Range: 80 - 120 mg / dl Ruffin, Haylei ([id number]) [id number] Page 9 of 9 Electronic Signature(s) Signed: 07/18/2022 12:13:42 PM By: Karie Schwalbe RN Entered By: Karie Schwalbe on 07/17/2022 10:27:50

## 2022-07-28 ENCOUNTER — Encounter (HOSPITAL_BASED_OUTPATIENT_CLINIC_OR_DEPARTMENT_OTHER): Payer: Medicare Other | Admitting: General Surgery

## 2022-07-28 DIAGNOSIS — I872 Venous insufficiency (chronic) (peripheral): Secondary | ICD-10-CM | POA: Diagnosis not present

## 2022-07-31 NOTE — Progress Notes (Signed)
JERRELL, ADLEY ([id number]) [id number] Page 1 of 11 Visit Report for 07/28/2022 Chief Complaint Document Details Patient Name: Date of Service: [name], [place] RO L 07/28/2022 9:30 A M Medical Record Number: [id number] Patient Account Number: [id number] Date of Birth/Sex: Treating RN: 1930-07-09 (89 y.o. F) Primary Care Provider: Romeo Rabon Other Clinician: Referring Provider: Treating Provider/Extender: [name] in Treatment: 18 Information Obtained from: Patient Chief Complaint Patient presents for treatment of open ulcers due to venous insufficiency Electronic Signature(s) Signed: 07/28/2022 10:20:07 AM By: Duanne Guess MD FACS Entered By: Duanne Guess on 07/28/2022 10:20:07 -------------------------------------------------------------------------------- Debridement Details Patient Name: Date of Service: [name], [name] 07/28/2022 9:30 A M Medical Record Number: [id number] Patient Account Number: [id number] Date of Birth/Sex: Treating RN: 12-Nov-1930 (89 y.o. Gevena Mart Primary Care Provider: Romeo Rabon Other Clinician: Referring Provider: Treating Provider/Extender: [name] in Treatment: 18 Debridement Performed for Assessment: Wound #12 Left T Fifth oe Performed By: Physician Duanne Guess, MD Debridement Type: Debridement Level of Consciousness (Pre-procedure): Awake and Alert Pre-procedure Verification/Time Out Yes - 10:08 Taken: Start Time: 10:09 Pain Control: Lidocaine 4% T opical Solution Percent of Wound Bed Debrided: 100% T Area Debrided (cm): otal 0.03 Tissue and other material debrided: Viable, Non-Viable, Callus Level: Non-Viable Tissue Debridement Description: Selective/Open Wound Instrument: Curette Bleeding: Minimum Hemostasis Achieved: Pressure End Time: 10:10 Procedural Pain: 0 Post Procedural Pain: 0 Response to Treatment: Procedure was  tolerated well Level of Consciousness (Post- Awake and Alert procedure): Post Debridement Measurements of Total Wound Length: (cm) 0.2 Width: (cm) 0.2 Depth: (cm) 0.1 Volume: (cm) 0.003 Character of Wound/Ulcer Post Debridement: Improved DOMNIQUE, RAYSOR ([id number]) [id number] Page 2 of 11 Post Procedure Diagnosis Same as Pre-procedure Notes Scribed for Dr Lady Gary by Brenton Grills RN. Electronic Signature(s) Signed: 07/28/2022 10:46:53 AM By: Duanne Guess MD FACS Signed: 07/31/2022 2:57:52 PM By: Brenton Grills Entered By: Brenton Grills on 07/28/2022 10:10:43 -------------------------------------------------------------------------------- Debridement Details Patient Name: Date of Service: [name], [name] 07/28/2022 9:30 A M Medical Record Number: [id number] Patient Account Number: [id number] Date of Birth/Sex: Treating RN: 1930/12/22 (88 y.o. F) Primary Care Provider: Romeo Rabon Other Clinician: Referring Provider: Treating Provider/Extender: [name] in Treatment: 18 Debridement Performed for Assessment: Wound #13 Lateral Lower Leg Performed By: Physician Duanne Guess, MD Debridement Type: Debridement Level of Consciousness (Pre-procedure): Awake and Alert Pre-procedure Verification/Time Out Yes - 10:08 Taken: Start Time: 10:09 Pain Control: Lidocaine 4% T opical Solution Percent of Wound Bed Debrided: 100% T Area Debrided (cm): otal 2.16 Tissue and other material debrided: Viable, Non-Viable, Slough, Slough Level: Non-Viable Tissue Debridement Description: Selective/Open Wound Instrument: Curette Bleeding: Minimum Hemostasis Achieved: Pressure End Time: 10:10 Procedural Pain: 0 Post Procedural Pain: 0 Response to Treatment: Procedure was tolerated well Level of Consciousness (Post- Awake and Alert procedure): Post Debridement Measurements of Total Wound Length: (cm) 0.5 Width: (cm) 5.5 Depth:  (cm) 0.1 Volume: (cm) 0.216 Character of Wound/Ulcer Post Debridement: Stable Post Procedure Diagnosis Same as Pre-procedure Notes Scribed for Dr Lady Gary by Brenton Grills RN. Electronic Signature(s) Signed: 07/28/2022 10:25:50 AM By: Duanne Guess MD FACS Entered By: Duanne Guess on 07/28/2022 10:25:50 Valaria Good ([id number]) [id number] Page 3 of 11 -------------------------------------------------------------------------------- HPI Details Patient Name: Date of Service: [name], [place] RO L 07/28/2022 9:30 A M Medical Record Number: [id number] Patient Account Number: [id number] Date of Birth/Sex: Treating RN: October 10, 1930 (89 y.o. F) Primary Care Provider: Romeo Rabon Other Clinician: Referring Provider: Treating Provider/Extender: Sandi Mariscal,  Lamar Sprinkles in Treatment: 18 History of Present Illness HPI Description: ADMISSION 03/20/2022 This is an 89 year old woman with coronary artery disease, on chronic corticosteroids for osteoarthritis, who presents with bilateral lower leg ulcers. They have been present for a couple of weeks. I do not have any vascular studies available to discern whether or not she has had venous reflux or arterial studies done. ABIs in clinic were 1.34 on the left and 0.95 on the right. She is not diabetic. She does not smoke. Her legs are quite edematous and she has multiple hematomas of various sizes and ages extending up to at least the distal thigh. She has 3 ulcers on the left leg with slough and eschar accumulation. On the right leg, she has a tiny superficial ulcer on the anterior tibial surface. She also has a small ulcer on the lateral aspect of her right third toe. 03/27/2022: The ulcer on her right anterior tibial surface and lateral third toe are both healed. The ulcers on her left leg are smaller but have slough accumulation. Edema control is good. Her juxta lite stockings arrived in the mail, but she  did not bring them with her today. 04/06/2022: She has a new skin tear just distal to her left knee. The ulcer on her left upper posterior calf is nearly healed under a layer of eschar. She has a spot on her right second toe that she is concerned about, saying that she frequently gets an ulcer there, but I do not see any open wound in this site. 04/21/2022: All of her wounds are healed except for the skin tear distal to her left knee. There is a layer of eschar on the surface. Underneath, the wound is quite superficial and clean. 04/19/2022: All of her wounds are healed. She is wearing a juxta lite stocking on her right leg and has brought her other stocking for [place] to apply to her left. 05/22/2022: She returns today with new ulcers on her left leg. A large hematoma/blood blister on her upper lateral leg, just distal to the knee opened up shortly after she was healed out from our clinic. There is slough on the wound surface, but no concern for infection. She has a second ulcer on her anterior tibial surface with slough on the surface. The small site on her right second toe that we have been monitoring has opened. There is some slough in the center with surrounding callus. She has a hematoma/blood blister on the medial aspect of her right lower leg that is not open, but it is certainly threatening to do so. 05/29/2022: The hematoma that had threatened to open last week is now open. There is slough and hematoma and nonviable subcutaneous tissue present. The toe ulcer is down to a small pinhole, but there is some undermining from some dry skin present. The ulcers on her left leg are both smaller. There is some slough accumulation on both. 06/05/2022: The more proximal of the left leg ulcers is closed by about 50%. There is some eschar on the wound surface. The anterior left leg ulcer is nearly healed with just a little eschar on the remaining open portion. The lateral foot ulcer has some eschar and slough present  the most recent right lower leg wound has slough and fat exposed. The right second toe ulcer looks about the same with a layer of callus and slough. 06/13/2022: The anterior left leg ulcer is closed. The proximal left lower leg ulcer is down to just about a  centimeter with some slough and eschar on the surface. The right foot ulcer is healed. The right anterior lower leg wound continues to accumulate slough but is a little bit smaller today. The right second toe ulcer is smaller underneath the layer of callus and slough. 06/19/2022: The proximal left lower leg ulcer is down to just a couple of millimeters. It is clean. The right anterior lower leg wound is smaller again today, but still with some slough buildup. The right second toe ulcer is down to just a pinpoint opening underneath some callus. 07/03/2022: The left lower leg ulcer is closed. The right anterior lower leg wound is smaller again this week. There is a little bit of light slough accumulation. The right second toe ulcer is unchanged underneath its layer of callus. She is also complaining of pain related to a callus in her midfoot as well as one on her fifth toe. 07/10/2022: The right anterior lower leg wound is nearly closed underneath the layer of eschar. The right second toe ulcer appears to have healed. The fifth toe callus on her left foot has reaccumulated and she says it is causing her pain. After debridement, I discovered an ulcer at the base of this callus. 07/17/2022: The right anterior lower leg wound is closed. The right second toe ulcer remains closed. The fifth toe ulcer on her left foot has reaccumulated some callus. The small wound remains open with some slough buildup. 07/28/2022: She has a new skin tear on her right lateral lower leg. She says it happened while she was pulling up her stockings, but denies snagging it with her jewelry or a fingernail. The skin flap seems to be at least partially adhered down in the exposed area  is clean, but it does extend to the fat layer. The wound on her lateral fifth toe on the left is nearly closed under a layer of callus. The right second toe wound has not reopened. Electronic Signature(s) Signed: 07/28/2022 10:21:12 AM By: Duanne Guess MD FACS Entered By: Duanne Guess on 07/28/2022 10:21:12 Valaria Good ([id number]) [id number] Page 4 of 11 -------------------------------------------------------------------------------- Physical Exam Details Patient Name: Date of Service: [name], [place] RO L 07/28/2022 9:30 A M Medical Record Number: [id number] Patient Account Number: [id number] Date of Birth/Sex: Treating RN: [birth year] (89 y.o. F) Primary Care Provider: Romeo Rabon Other Clinician: Referring Provider: Treating Provider/Extender: [name] in Treatment: 18 Constitutional . . . . no acute distress. Respiratory Normal work of breathing on room air. Notes 07/28/2022: She has a new skin tear on her right lateral lower leg. The skin flap seems to be at least partially adhered down in the exposed area is clean, but it does extend to the fat layer. The wound on her lateral fifth toe on the left is nearly closed under a layer of callus. The right second toe wound has not reopened. Electronic Signature(s) Signed: 07/28/2022 10:23:11 AM By: Duanne Guess MD FACS Entered By: Duanne Guess on 07/28/2022 10:23:11 -------------------------------------------------------------------------------- Physician Orders Details Patient Name: Date of Service: [name], [name] 07/28/2022 9:30 A M Medical Record Number: [id number] Patient Account Number: [id number] Date of Birth/Sex: Treating RN: 1930/04/23 (89 y.o. Gevena Mart Primary Care Provider: Romeo Rabon Other Clinician: Referring Provider: Treating Provider/Extender: [name] in Treatment: 18 Verbal / Phone Orders:  No Diagnosis Coding ICD-10 Coding Code Description [phone] Non-pressure chronic ulcer of other part of left foot with fat layer exposed L97.812 Non-pressure chronic ulcer of other  part of right lower leg with fat layer exposed R60.0 Localized edema I87.2 Venous insufficiency (chronic) (peripheral) Z79.52 Long term (current) use of systemic steroids L84 Corns and callosities Follow-up Appointments ppointment in 2 weeks. - Dr. Lady Gary - Room 3 Return A Bathing/ Shower/ Hygiene May shower with protection but do not get wound dressing(s) wet. Protect dressing(s) with water repellant cover (for example, large plastic bag) or a cast cover and may then take shower. Edema Control - Lymphedema / SCD / Other Elevate legs to the level of the heart or above for 30 minutes daily and/or when sitting for 3-4 times a day throughout the day. Avoid standing for long periods of time. Exercise regularly Moisturize legs daily. - left leg daily Compression stocking or Garment 20-30 mm/Hg pressure to: - juxtalite to left leg daily Wound Treatment TENILE, ULERY ([id number]) [phone].pdf Page 5 of 11 Wound #12 - T Fifth oe Wound Laterality: Left Cleanser: Soap and Water Every Other Day/30 Days Discharge Instructions: May shower and wash wound with dial antibacterial soap and water prior to dressing change. Cleanser: Wound Cleanser Every Other Day/30 Days Discharge Instructions: Cleanse the wound with wound cleanser prior to applying a clean dressing using gauze sponges, not tissue or cotton balls. Prim Dressing: Maxorb Extra Ag+ Alginate Dressing, 2x2 (in/in) Every Other Day/30 Days ary Discharge Instructions: Apply to wound bed as instructed Prim Dressing: Optifoam Non-Adhesive Dressing, 4x4 in Every Other Day/30 Days ary Discharge Instructions: Apply to wound bed to pad toe Secondary Dressing: Bordered Gauze, 2x2 in Every Other Day/30 Days Discharge Instructions: Apply over  primary dressing as directed. Wound #13 - Lower Leg Wound Laterality: Lateral Prim Dressing: Maxorb Extra Ag+ Alginate Dressing, 4x4.75 (in/in) ary Discharge Instructions: Apply to wound bed as instructed Compression Wrap: Urgo K2 Lite, (equivalent to a 3 layer) two layer compression system, regular Discharge Instructions: Apply Urgo K2 Lite as directed (alternative to 3 layer compression). Electronic Signature(s) Signed: 07/28/2022 10:46:53 AM By: Duanne Guess MD FACS Entered By: Duanne Guess on 07/28/2022 10:23:23 -------------------------------------------------------------------------------- Problem List Details Patient Name: Date of Service: [name], [name] 07/28/2022 9:30 A M Medical Record Number: [id number] Patient Account Number: [id number] Date of Birth/Sex: Treating RN: 1930-05-23 (89 y.o. Gevena Mart Primary Care Provider: Romeo Rabon Other Clinician: Referring Provider: Treating Provider/Extender: [name] in Treatment: 18 Active Problems ICD-10 Encounter Code Description Active Date MDM Diagnosis [phone] Non-pressure chronic ulcer of other part of left foot with fat layer exposed 07/10/2022 No Yes L97.812 Non-pressure chronic ulcer of other part of right lower leg with fat layer 07/28/2022 No Yes exposed R60.0 Localized edema 03/20/2022 No Yes I87.2 Venous insufficiency (chronic) (peripheral) 03/20/2022 No Yes Z79.52 Long term (current) use of systemic steroids 03/20/2022 No Yes L84 Corns and callosities 06/05/2022 No Yes SHAWNTA, MORFORD ([id number]) [id number] Page 6 of 11 Inactive Problems Resolved Problems ICD-10 Code Description Active Date Resolved Date L97.811 Non-pressure chronic ulcer of other part of right lower leg limited to breakdown of skin 03/20/2022 03/20/2022 Q59.563 Non-pressure chronic ulcer of other part of left lower leg with fat layer exposed 05/22/2022 03/20/2022 L97.511  Non-pressure chronic ulcer of other part of right foot limited to breakdown of skin 03/20/2022 03/20/2022 L97.512 Non-pressure chronic ulcer of other part of right foot with fat layer exposed 05/22/2022 05/22/2022 L97.812 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ed 05/29/2022 05/29/2022 Electronic Signature(s) Signed: 07/28/2022 10:17:27 AM By: Duanne Guess MD FACS Entered By: Duanne Guess  on 07/28/2022 10:17:27 -------------------------------------------------------------------------------- Progress Note Details Patient Name: Date of Service: [name], [place] RO L 07/28/2022 9:30 A M Medical Record Number: [id number] Patient Account Number: [id number] Date of Birth/Sex: Treating RN: 1930-12-30 (89 y.o. F) Primary Care Provider: Romeo Rabon Other Clinician: Referring Provider: Treating Provider/Extender: [name] in Treatment: 18 Subjective Chief Complaint Information obtained from Patient Patient presents for treatment of open ulcers due to venous insufficiency History of Present Illness (HPI) ADMISSION 03/20/2022 This is an 89 year old woman with coronary artery disease, on chronic corticosteroids for osteoarthritis, who presents with bilateral lower leg ulcers. They have been present for a couple of weeks. I do not have any vascular studies available to discern whether or not she has had venous reflux or arterial studies done. ABIs in clinic were 1.34 on the left and 0.95 on the right. She is not diabetic. She does not smoke. Her legs are quite edematous and she has multiple hematomas of various sizes and ages extending up to at least the distal thigh. She has 3 ulcers on the left leg with slough and eschar accumulation. On the right leg, she has a tiny superficial ulcer on the anterior tibial surface. She also has a small ulcer on the lateral aspect of her right third toe. 03/27/2022: The ulcer on her right anterior  tibial surface and lateral third toe are both healed. The ulcers on her left leg are smaller but have slough accumulation. Edema control is good. Her juxta lite stockings arrived in the mail, but she did not bring them with her today. 04/06/2022: She has a new skin tear just distal to her left knee. The ulcer on her left upper posterior calf is nearly healed under a layer of eschar. She has a spot on her right second toe that she is concerned about, saying that she frequently gets an ulcer there, but I do not see any open wound in this site. 04/21/2022: All of her wounds are healed except for the skin tear distal to her left knee. There is a layer of eschar on the surface. Underneath, the wound is quite superficial and clean. 04/19/2022: All of her wounds are healed. She is wearing a juxta lite stocking on her right leg and has brought her other stocking for [place] to apply to her left. 05/22/2022: She returns today with new ulcers on her left leg. A large hematoma/blood blister on her upper lateral leg, just distal to the knee opened up shortly after she was healed out from our clinic. There is slough on the wound surface, but no concern for infection. She has a second ulcer on her anterior tibial surface with slough on the surface. The small site on her right second toe that we have been monitoring has opened. There is some slough in the center with surrounding callus. She has a hematoma/blood blister on the medial aspect of her right lower leg that is not open, but it is certainly threatening to do so. LUISE, YOWELL ([id number]) [id number] Page 7 of 11 05/29/2022: The hematoma that had threatened to open last week is now open. There is slough and hematoma and nonviable subcutaneous tissue present. The toe ulcer is down to a small pinhole, but there is some undermining from some dry skin present. The ulcers on her left leg are both smaller. There is some slough accumulation on  both. 06/05/2022: The more proximal of the left leg ulcers is closed by about 50%. There is some eschar on the wound  surface. The anterior left leg ulcer is nearly healed with just a little eschar on the remaining open portion. The lateral foot ulcer has some eschar and slough present the most recent right lower leg wound has slough and fat exposed. The right second toe ulcer looks about the same with a layer of callus and slough. 06/13/2022: The anterior left leg ulcer is closed. The proximal left lower leg ulcer is down to just about a centimeter with some slough and eschar on the surface. The right foot ulcer is healed. The right anterior lower leg wound continues to accumulate slough but is a little bit smaller today. The right second toe ulcer is smaller underneath the layer of callus and slough. 06/19/2022: The proximal left lower leg ulcer is down to just a couple of millimeters. It is clean. The right anterior lower leg wound is smaller again today, but still with some slough buildup. The right second toe ulcer is down to just a pinpoint opening underneath some callus. 07/03/2022: The left lower leg ulcer is closed. The right anterior lower leg wound is smaller again this week. There is a little bit of light slough accumulation. The right second toe ulcer is unchanged underneath its layer of callus. She is also complaining of pain related to a callus in her midfoot as well as one on her fifth toe. 07/10/2022: The right anterior lower leg wound is nearly closed underneath the layer of eschar. The right second toe ulcer appears to have healed. The fifth toe callus on her left foot has reaccumulated and she says it is causing her pain. After debridement, I discovered an ulcer at the base of this callus. 07/17/2022: The right anterior lower leg wound is closed. The right second toe ulcer remains closed. The fifth toe ulcer on her left foot has reaccumulated some callus. The small wound remains open with  some slough buildup. 07/28/2022: She has a new skin tear on her right lateral lower leg. She says it happened while she was pulling up her stockings, but denies snagging it with her jewelry or a fingernail. The skin flap seems to be at least partially adhered down in the exposed area is clean, but it does extend to the fat layer. The wound on her lateral fifth toe on the left is nearly closed under a layer of callus. The right second toe wound has not reopened. Patient History Information obtained from Patient. Family History Heart Disease - Father, Hypertension - Mother, No family history of Cancer, Diabetes, Hereditary Spherocytosis, Kidney Disease, Lung Disease, Seizures, Stroke, Thyroid Problems, Tuberculosis. Social History Never smoker, Marital Status - Married, Alcohol Use - Rarely, Drug Use - No History, Caffeine Use - Moderate. 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 - cardiac catheterization. - cataract extraction bilat.. - colonoscopy. - bilateral total knee replacement. - upper GI endoscopy. Medical A Surgical History Notes nd Gastrointestinal diverticulosis, GERD Musculoskeletal osteoporosis Objective Constitutional no acute distress. Vitals Time Taken: 9:36 AM, Height: 68 in, Weight: 180 lbs, BMI: 27.4, Temperature: 97.8 F, Pulse: 82 bpm, Respiratory Rate: 18 breaths/min, Blood Pressure: 138/79 mmHg. Respiratory Normal work of breathing on room air. General Notes: 07/28/2022: She has a new skin tear on her right lateral lower leg. The  skin flap seems to be at least partially adhered down in the exposed  area is clean, but it does extend to the fat layer. The wound on her lateral fifth toe on the left is nearly closed under a layer of callus. The right second toe wound has not reopened. Integumentary (Hair, Skin) Wound #12 status is Open. Original cause of wound was Gradually Appeared. The date acquired was: 07/10/2022. The wound has been in treatment 2 weeks. RIA, KATE ([id number]) [id number] Page 8 of 11 The wound is located on the Left T Fifth. The wound measures 0.2cm length x 0.2cm width x 0.1cm depth; 0.031cm^2 area and 0.003cm^3 volume. There is oe Fat Layer (Subcutaneous Tissue) exposed. There is a medium amount of serosanguineous drainage noted. There is no granulation within the wound bed. There is a large (67-100%) amount of necrotic tissue within the wound bed including Eschar. The periwound skin appearance had no abnormalities noted for moisture. The periwound skin appearance had no abnormalities noted for color. The periwound skin appearance exhibited: Callus. Periwound temperature was noted as No Abnormality. Wound #13 status is Open. Original cause of wound was Contusion/Bruise. The date acquired was: 07/26/2022. The wound is located on the Lateral Lower Leg. The wound measures 0.5cm length x 5.5cm width x 0.1cm depth; 2.16cm^2 area and 0.216cm^3 volume. There is Fat Layer (Subcutaneous Tissue) exposed. There is no tunneling or undermining noted. There is a medium amount of serosanguineous drainage noted. The wound margin is distinct with the outline attached to the wound base. There is medium (34-66%) red granulation within the wound bed. There is a medium (34-66%) amount of necrotic tissue within the wound bed including Adherent Slough. The periwound skin appearance exhibited: Scarring, Ecchymosis. Periwound temperature was noted as No Abnormality. 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 There was a Selective/Open Wound Non-Viable Tissue Debridement oe with a total area of 0.03 sq cm performed by Duanne Guess, MD. With the following instrument(s): Curette to remove Viable and Non-Viable tissue/material. Material removed includes Callus after achieving pain control using Lidocaine 4% Topical Solution. No specimens were taken. A time out was conducted at 10:08, prior to the start of the procedure. A Minimum amount of bleeding was controlled with Pressure. The procedure was tolerated well with a pain level of 0 throughout and a pain level of 0 following the procedure. Post Debridement Measurements: 0.2cm length x 0.2cm width x 0.1cm depth; 0.003cm^3 volume. Character of Wound/Ulcer Post Debridement is improved. Post procedure Diagnosis Wound #12: Same as Pre-Procedure General Notes: Scribed for Dr Lady Gary by Brenton Grills RN.Marland Kitchen Wound #13 Pre-procedure diagnosis of Wound #13 is a Lymphedema located on the Lateral Lower Leg . There was a Selective/Open Wound Non-Viable Tissue Debridement with a total area of 2.16 sq cm performed by Duanne Guess, MD. With the following instrument(s): Curette to remove Viable and Non-Viable tissue/material. Material removed includes [facility] after achieving pain control using Lidocaine 4% Topical Solution. No specimens were taken. A time out was conducted at 10:08, prior to the start of the procedure. A Minimum amount of bleeding was controlled with Pressure. The procedure was tolerated well with a pain level of 0  throughout and a pain level of 0 following the procedure. Post Debridement Measurements: 0.5cm length x 5.5cm  width x 0.1cm depth; 0.216cm^3 volume. Character of Wound/Ulcer Post Debridement is stable. Post procedure Diagnosis Wound #13: Same as Pre-Procedure General Notes: Scribed for Dr Lady Gary by Brenton Grills RN.. Pre-procedure diagnosis of Wound #13 is a Lymphedema located on the Lateral Lower Leg . There was a Three Layer Compression Therapy Procedure by Brenton Grills, RN. Post procedure Diagnosis Wound #13: Same as Pre-Procedure Plan Follow-up Appointments: Return Appointment in 2 weeks. - Dr. Lady Gary - Room 3 Bathing/ Shower/ Hygiene: May shower with protection but do not get wound dressing(s) wet. Protect dressing(s) with water repellant cover (for example, large plastic bag) or a cast cover and may then take shower. Edema Control - Lymphedema / SCD / Other: Elevate legs to the level of the heart or above for 30 minutes daily and/or when sitting for 3-4 times a day throughout the day. Avoid standing for long periods of time. Exercise regularly Moisturize legs daily. - left leg daily Compression stocking or Garment 20-30 mm/Hg pressure to: - juxtalite to left leg daily WOUND #12: - T Fifth Wound Laterality: Left oe Cleanser: Soap and Water Every Other Day/30 Days Discharge Instructions: May shower and wash wound with dial antibacterial soap and water prior to dressing change. Cleanser: Wound Cleanser Every Other Day/30 Days Discharge Instructions: Cleanse the wound with wound cleanser prior to applying a clean dressing using gauze sponges, not tissue or cotton balls. Prim Dressing: Maxorb Extra Ag+ Alginate Dressing, 2x2 (in/in) Every Other Day/30 Days ary Discharge Instructions: Apply to wound bed as instructed Prim Dressing: Optifoam Non-Adhesive Dressing, 4x4 in Every Other Day/30 Days ary Discharge Instructions: Apply to wound bed to pad toe Secondary Dressing: Bordered Gauze, 2x2 in Every Other Day/30 Days Discharge Instructions: Apply over primary dressing as directed. SHYLAH, SALDANA ([id number]) [id number] Page 9 of 11 WOUND #13: - Lower Leg Wound Laterality: Lateral Prim Dressing: Maxorb Extra Ag+ Alginate Dressing, 4x4.75 (in/in) ary Discharge Instructions: Apply to wound bed as instructed Com pression Wrap: Urgo K2 Lite, (equivalent to a 3 layer) two layer compression system, regular Discharge Instructions: Apply Urgo K2 Lite as directed (alternative to 3 layer compression). 07/28/2022: She has a new skin tear on her right lateral lower leg. She says it happened while she was pulling up her stockings, but denies snagging it with her jewelry or a fingernail. The skin flap seems to be at least partially adhered down in the exposed area is clean, but it does extend to the fat layer. The wound on her lateral fifth toe on the left is nearly closed under a layer of callus. The right second toe wound has not reopened. I used a curette to debride the slough from her new skin tear site. I also debrided the callus from her left fifth toe wound. We will apply silver alginate to the leg and 3 layer compression equivalent. Continue silver alginate and foam donut on the fifth toe. She will wear her juxta lite stocking on the left leg. Due to the Fourth of July holiday, we only have a nurse visit available for her next week but I will see her in 2 weeks. Electronic Signature(s) Signed: 07/28/2022 10:26:18 AM By: Duanne Guess MD FACS Previous Signature: 07/28/2022 10:25:00 AM Version By: Duanne Guess MD FACS Entered By: Duanne Guess on 07/28/2022 10:26:18 -------------------------------------------------------------------------------- HxROS Details Patient Name: Date of Service: [name], [name] 07/28/2022 9:30 A M Medical  Record Number: [id number] Patient Account Number: [id number] Date of Birth/Sex: Treating RN: September 04, 1930 (89 y.o. F) Primary Care Provider: Romeo Rabon Other Clinician: Referring Provider: Treating Provider/Extender:  [name] in Treatment: 18 Information Obtained From Patient Eyes Medical History: Positive for: Cataracts Hematologic/Lymphatic Medical History: Positive for: Lymphedema Respiratory Medical History: Positive for: Sleep Apnea Cardiovascular Medical History: Positive for: Coronary Artery Disease; Hypertension Gastrointestinal Medical History: Positive for: Hepatitis B - 50+ yrs ago Past Medical History Notes: diverticulosis, GERD Musculoskeletal Medical History: Positive for: Osteoarthritis Past Medical History Notes: osteoporosis HBO Extended History Items HYPPOLITE, [name] ([id number]) [id number] Page 10 of 11 Eyes: Cataracts Immunizations Pneumococcal Vaccine: Received Pneumococcal Vaccination: Yes Received Pneumococcal Vaccination On or After 60th Birthday: Yes Implantable Devices No devices added Hospitalization / Surgery History Type of Hospitalization/Surgery abdominal hysterectomy cardiac catheterization cataract extraction bilat. colonoscopy bilateral total knee replacement upper GI endoscopy Family and Social History Cancer: No; Diabetes: No; Heart Disease: Yes - Father; Hereditary Spherocytosis: No; Hypertension: Yes - Mother; Kidney Disease: No; Lung Disease: No; Seizures: No; Stroke: No; Thyroid Problems: No; Tuberculosis: No; Never smoker; Marital Status - Married; Alcohol Use: Rarely; Drug Use: No History; Caffeine Use: Moderate; Financial Concerns: No; Food, Clothing or Shelter Needs: No; Support System Lacking: No; Transportation Concerns: No Electronic Signature(s) Signed: 07/28/2022 10:46:53 AM By: Duanne Guess MD FACS Entered By: Duanne Guess on 07/28/2022 10:21:18 -------------------------------------------------------------------------------- SuperBill Details Patient Name: Date of Service: [name], [name] 07/28/2022 Medical Record Number: [id number] Patient Account Number:  [id number] Date of Birth/Sex: Treating RN: 1930/03/11 (89 y.o. Gevena Mart Primary Care Provider: Romeo Rabon Other Clinician: Referring Provider: Treating Provider/Extender: [name] in Treatment: 18 Diagnosis Coding ICD-10 Codes Code Description [phone] Non-pressure chronic ulcer of other part of left foot with fat layer exposed L97.812 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ed R60.0 Localized edema I87.2 Venous insufficiency (chronic) (peripheral) Z79.52 Long term (current) use of systemic steroids L84 Corns and callosities Facility Procedures : CPT4 Code: [id number] Description: 97597 - DEBRIDE WOUND 1ST 20 SQ CM OR < ICD-10 Diagnosis Description L97.522 Non-pressure chronic ulcer of other part of left foot with fat layer exposed L97.812 Non-pressure chronic ulcer of other part of right lower leg with fat layer  expos Modifier: ed Quantity: 1 Physician Procedures : CPT4 Code Description Modifier [id number] - WC PHYS LEVEL 4 - EST PT 25 ICD-10 Diagnosis Description L97.522 Non-pressure chronic ulcer of other part of left foot with fat layer exposed Lein, Deshauna ([id number]) [phone]  L97.812 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ed I87.2 Venous insufficiency (chronic) (peripheral) Z79.52 Long term (current) use of systemic steroids Quantity: 1 27.pdf Page 11 of 11 : [id number] 97597 - WC PHYS DEBR WO ANESTH 20 SQ CM 1 ICD-10 Diagnosis Description L97.522 Non-pressure chronic ulcer of other part of left foot with fat layer exposed L97.812 Non-pressure chronic ulcer of other part of right lower leg with fat layer  exposed Quantity: Electronic Signature(s) Signed: 07/28/2022 10:26:51 AM By: Duanne Guess MD FACS Entered By: Duanne Guess on 07/28/2022 10:26:51

## 2022-07-31 NOTE — Progress Notes (Signed)
Pella, [name] ([id number]) [id number] Page 1 of 9 Visit Report for 07/28/2022 Arrival Information Details Patient Name: Date of Service: [name], [name] L 07/28/2022 9:30 A M Medical Record Number: [id number] Patient Account Number: [id number] Date of Birth/Sex: Treating RN: September 03, 1930 (89 y.o. F) Primary Care Hemi Chacko: Romeo Rabon Other Clinician: Referring Karter Haire: Treating Mariene Dickerman/Extender: [name] in Treatment: 18 Visit Information History Since Last Visit All ordered tests and consults were completed: No Patient Arrived: [name] Added or deleted any medications: No Arrival Time: 09:36 Any Linda allergies or adverse reactions: No Accompanied By: daughter Had a fall or experienced change in No Transfer Assistance: None activities of daily living that may affect Patient Identification Verified: Yes risk of falls: Secondary Verification Process Completed: Yes Signs or symptoms of abuse/neglect since last visito No Patient Requires Transmission-Based Precautions: No Hospitalized since last visit: No Patient Has Alerts: No Implantable device outside of the clinic excluding No cellular tissue based products placed in the center since last visit: Pain Present Now: No Electronic Signature(s) Signed: 07/28/2022 9:55:11 AM By: Dayton Scrape Entered By: Dayton Scrape on 07/28/2022 09:36:48 -------------------------------------------------------------------------------- Compression Therapy Details Patient Name: Date of Service: [name], [place] [name] L 07/28/2022 9:30 A M Medical Record Number: [id number] Patient Account Number: [id number] Date of Birth/Sex: Treating RN: [birth year] (89 y.o. Gevena Mart Primary Care Rhapsody Wolven: Romeo Rabon Other Clinician: Referring Ellary Casamento: Treating Cruze Zingaro/Extender: [name] in Treatment: 18 Compression Therapy Performed for Wound Assessment: Wound #13 Lateral  Lower Leg Performed By: Clinician Brenton Grills, RN Compression Type: Three Layer Post Procedure Diagnosis Same as Pre-procedure Electronic Signature(s) Signed: 07/31/2022 2:57:52 PM By: Brenton Grills Entered By: Brenton Grills on 07/28/2022 10:18:18 [name] ([id number]) [id number] Page 2 of 9 -------------------------------------------------------------------------------- Encounter Discharge Information Details Patient Name: Date of Service: [name], Dearing [name] Elbert Ewings 07/28/2022 9:30 A M Medical Record Number: [id number] Patient Account Number: [id number] Date of Birth/Sex: Treating RN: 07/14/1930 (89 y.o. Gevena Mart Primary Care Miquela Costabile: Romeo Rabon Other Clinician: Referring Glora Hulgan: Treating Aliceson Dolbow/Extender: [name] in Treatment: 18 Encounter Discharge Information Items Post Procedure Vitals Discharge Condition: Stable Temperature (F): 98.1 Ambulatory Status: Walker Pulse (bpm): 78 Discharge Destination: Home Respiratory Rate (breaths/min): 18 Transportation: Private Auto Blood Pressure (mmHg): 139/65 Accompanied By: daughter Schedule Follow-up Appointment: Yes Clinical Summary of Care: Patient Declined Electronic Signature(s) Signed: 07/31/2022 2:57:52 PM By: Brenton Grills Entered By: Brenton Grills on 07/28/2022 10:43:48 -------------------------------------------------------------------------------- Lower Extremity Assessment Details Patient Name: Date of Service: [name], [place] [name] L 07/28/2022 9:30 A M Medical Record Number: [id number] Patient Account Number: [id number] Date of Birth/Sex: Treating RN: 09-09-1930 (89 y.o. Gevena Mart Primary Care Tymar Polyak: Romeo Rabon Other Clinician: Referring Kameron Glazebrook: Treating Nedda Gains/Extender: [name] in Treatment: 18 Edema Assessment Assessed: Kyra Searles: No] Franne Forts: No] Edema: [Left: Yes] [Right: Yes] Calf Left:  Right: Point of Measurement: From Medial Instep 30.5 cm 30 cm Ankle Left: Right: Point of Measurement: From Medial Instep 20 cm 20 cm Vascular Assessment Pulses: Dorsalis Pedis Palpable: [Left:Yes] [Right:Yes] Electronic Signature(s) Signed: 07/31/2022 2:57:52 PM By: Brenton Grills Entered By: Brenton Grills on 07/28/2022 09:49:33 Multi Wound Chart Details -------------------------------------------------------------------------------- [name] ([id number]) [id number] Page 3 of 9 Patient Name: Date of Service: [name], [place] [name] L 07/28/2022 9:30 A M Medical Record Number: [id number] Patient Account Number: [id number] Date of Birth/Sex: Treating RN: 12-Aug-1930 (89 y.o. F) Primary Care Mannat Benedetti: Romeo Rabon Other Clinician: Referring Jauan Wohl: Treating Monika Chestang/Extender: [name]  in Treatment: 18 Vital Signs Height(in): 68 Pulse(bpm): 82 Weight(lbs): 180 Blood Pressure(mmHg): 138/79 Body Mass Index(BMI): 27.4 Temperature(F): 97.8 Respiratory Rate(breaths/min): 18 Wound Assessments Wound Number: 12 13 N/A Photos: N/A Left T Fifth oe Lateral Lower Leg N/A Wound Location: Gradually Appeared Contusion/Bruise N/A Wounding Event: Lymphedema Lymphedema N/A Primary Etiology: Cataracts, Lymphedema, Sleep Cataracts, Lymphedema, Sleep N/A Comorbid History: Apnea, Coronary Artery Disease, Apnea, Coronary Artery Disease, Hypertension, Hepatitis B, Hypertension, Hepatitis B, Osteoarthritis Osteoarthritis 07/10/2022 07/26/2022 N/A Date Acquired: 2 0 N/A Weeks of Treatment: Open Open N/A Wound Status: No No N/A Wound Recurrence: 0.1x0.1x0.1 0.5x5.5x0.1 N/A Measurements L x W x D (cm) 0.008 2.16 N/A A (cm) : rea 0.001 0.216 N/A Volume (cm) : 74.20% N/A N/A % Reduction in A rea: 66.70% N/A N/A % Reduction in Volume: Full Thickness Without Exposed Full Thickness Without Exposed N/A Classification: Support  Structures Support Structures Medium Medium N/A Exudate A mount: Serosanguineous Serosanguineous N/A Exudate Type: red, brown red, brown N/A Exudate Color: N/A Distinct, outline attached N/A Wound Margin: None Present (0%) Medium (34-66%) N/A Granulation A mount: N/A Red N/A Granulation Quality: Large (67-100%) Medium (34-66%) N/A Necrotic A mount: Eschar, Adherent Slough Adherent Slough N/A Necrotic Tissue: Fat Layer (Subcutaneous Tissue): Yes Fat Layer (Subcutaneous Tissue): Yes N/A Exposed Structures: Fascia: No Tendon: No Muscle: No Joint: No Bone: No Large (67-100%) N/A N/A Epithelialization: Debridement - Selective/Open Wound Debridement - Excisional N/A Debridement: Pre-procedure Verification/Time Out 10:08 10:08 N/A Taken: Lidocaine 4% Topical Solution Lidocaine 4% Topical Solution N/A Pain Control: Callus Subcutaneous, Slough N/A Tissue Debrided: Non-Viable Tissue Skin/Subcutaneous Tissue N/A Level: 0.03 2.16 N/A Debridement A (sq cm): rea Curette Curette N/A Instrument: Minimum Minimum N/A Bleeding: Pressure Pressure N/A Hemostasis A chieved: 0 0 N/A Procedural Pain: 0 0 N/A Post Procedural Pain: Procedure was tolerated well Procedure was tolerated well N/A Debridement Treatment Response: 0.2x0.2x0.1 0.5x5.5x0.1 N/A Post Debridement Measurements L x W x D (cm) 0.003 0.216 N/A Post Debridement Volume: (cm) Callus: Yes Scarring: Yes N/A Periwound Skin Texture: No Abnormalities Noted N/A Periwound Skin Moisture: No Abnormalities Noted Ecchymosis: Yes N/A Periwound Skin Color: No Abnormality No Abnormality N/A Temperature: Debridement Debridement N/A Procedures Performed: ZEIDY, STILLION ([id number]) [id number] Page 4 of 9 Treatment Notes Electronic Signature(s) Signed: 07/28/2022 10:18:09 AM By: Duanne Guess MD FACS Entered By: Duanne Guess on 07/28/2022  10:18:09 -------------------------------------------------------------------------------- Multi-Disciplinary Care Plan Details Patient Name: Date of Service: [name], [place] [name] L 07/28/2022 9:30 A M Medical Record Number: [id number] Patient Account Number: [id number] Date of Birth/Sex: Treating RN: December 04, 1930 (89 y.o. Gevena Mart Primary Care Curt Oatis: Romeo Rabon Other Clinician: Referring Natashia Roseman: Treating Kisa Fujii/Extender: [name] in Treatment: 18 Multidisciplinary Care Plan reviewed with physician Active Inactive Abuse / Safety / Falls / Self Care Management Nursing Diagnoses: History of Falls Impaired physical mobility Potential for falls Goals: Patient will not experience any injury related to falls Date Initiated: 03/20/2022 Date Inactivated: 05/29/2022 Target Resolution Date: 05/05/2022 Goal Status: Met Patient/caregiver will demonstrate safe use of adaptive devices to increase mobility Date Initiated: 03/20/2022 Date Inactivated: 04/27/2022 Target Resolution Date: 05/05/2022 Goal Status: Met Patient/caregiver will verbalize/demonstrate measures taken to prevent injury and/or falls Date Initiated: 05/29/2022 Target Resolution Date: 08/30/2022 Goal Status: Active Interventions: Assess: immobility, friction, shearing, incontinence upon admission and as needed Provide education on fall prevention Notes: Venous Leg Ulcer Nursing Diagnoses: Knowledge deficit related to disease process and management Potential for venous Insuffiency (use before diagnosis confirmed) Goals: Patient will maintain optimal edema control Date  Initiated: 05/22/2022 Target Resolution Date: 08/30/2022 Goal Status: Active Interventions: Assess peripheral edema status every visit. Compression as ordered Provide education on venous insufficiency Treatment Activities: Therapeutic compression applied : 05/22/2022 Notes: Wound/Skin Impairment CAISYN, BALTER  ([id number]) [id number] Page 5 of 9 Nursing Diagnoses: Impaired tissue integrity Knowledge deficit related to ulceration/compromised skin integrity Goals: Patient/caregiver will verbalize understanding of skin care regimen Date Initiated: 03/20/2022 Target Resolution Date: 08/30/2022 Goal Status: Active Interventions: Assess ulceration(s) every visit Treatment Activities: Skin care regimen initiated : 03/20/2022 Topical wound management initiated : 03/20/2022 Notes: Electronic Signature(s) Signed: 07/31/2022 2:57:52 PM By: Brenton Grills Entered By: Brenton Grills on 07/28/2022 10:03:29 -------------------------------------------------------------------------------- Pain Assessment Details Patient Name: Date of Service: [name], [place] [name] L 07/28/2022 9:30 A M Medical Record Number: [id number] Patient Account Number: [id number] Date of Birth/Sex: Treating RN: December 07, 1930 (89 y.o. F) Primary Care Gadge Hermiz: Romeo Rabon Other Clinician: Referring Brylan Seubert: Treating Audri Kozub/Extender: [name] in Treatment: 18 Active Problems Location of Pain Severity and Description of Pain Patient Has Paino Yes Site Locations Rate the pain. Current Pain Level: 2 Worst Pain Level: 10 Least Pain Level: 0 Tolerable Pain Level: 0 Pain Management and Medication Current Pain Management: Electronic Signature(s) Signed: 07/28/2022 9:55:11 AM By: Dayton Scrape Entered By: Dayton Scrape on 07/28/2022 09:37:28 [name], Linda ([id number]) [id number] Page 6 of 9 -------------------------------------------------------------------------------- Patient/Caregiver Education Details Patient Name: Date of Service: [name], [place] 6/28/2024andnbsp9:30 A M Medical Record Number: [id number] Patient Account Number: [id number] Date of Birth/Gender: Treating RN: August 08, 1930 (89 y.o. Gevena Mart Primary Care Physician: Romeo Rabon  Other Clinician: Referring Physician: Treating Physician/Extender: [name] in Treatment: 18 Education Assessment Education Provided To: Patient and Caregiver Education Topics Provided Wound/Skin Impairment: Methods: Explain/Verbal Responses: State content correctly Electronic Signature(s) Signed: 07/31/2022 2:57:52 PM By: Brenton Grills Entered By: Brenton Grills on 07/28/2022 10:03:46 -------------------------------------------------------------------------------- Wound Assessment Details Patient Name: Date of Service: [name], [place] [name] L 07/28/2022 9:30 A M Medical Record Number: [id number] Patient Account Number: [id number] Date of Birth/Sex: Treating RN: [birth year] (89 y.o. Gevena Mart Primary Care Nuvia Hileman: Romeo Rabon Other Clinician: Referring Shannah Conteh: Treating Jaima Janney/Extender: [name] in Treatment: 18 Wound Status Wound Number: 12 Primary Lymphedema Etiology: Wound Location: Left T Fifth oe Wound Open Wounding Event: Gradually Appeared Status: Date Acquired: 07/10/2022 Comorbid Cataracts, Lymphedema, Sleep Apnea, Coronary Artery Disease, Weeks Of Treatment: 2 History: Hypertension, Hepatitis B, Osteoarthritis Clustered Wound: No Photos Wound Measurements Length: (cm) 0.2 Width: (cm) 0.2 [name], Linda ([id number]) Depth: (cm) 0 Area: (cm) Volume: (cm) % Reduction in Area: 0% % Reduction in Volume: 0% [id number] Page 7 of 9 .1 Epithelialization: Large (67-100%) 0.031 0.003 Wound Description Classification: Full Thickness Without Exposed Support Structures Exudate Amount: Medium Exudate Type: Serosanguineous Exudate Color: red, brown Foul Odor After Cleansing: No Slough/Fibrino No Wound Bed Granulation Amount: None Present (0%) Exposed Structure Necrotic Amount: Large (67-100%) Fascia Exposed: No Necrotic Quality: Eschar Fat Layer (Subcutaneous Tissue)  Exposed: Yes Tendon Exposed: No Muscle Exposed: No Joint Exposed: No Bone Exposed: No Periwound Skin Texture Texture Color No Abnormalities Noted: No No Abnormalities Noted: Yes Callus: Yes Temperature / Pain Temperature: No Abnormality Moisture No Abnormalities Noted: Yes Treatment Notes Wound #12 (Toe Fifth) Wound Laterality: Left Cleanser Soap and Water Discharge Instruction: May shower and wash wound with dial antibacterial soap and water prior to dressing change. Wound Cleanser Discharge Instruction: Cleanse the wound with wound cleanser prior to applying a clean dressing using gauze sponges,  not tissue or cotton balls. Peri-Wound Care Topical Primary Dressing Maxorb Extra Ag+ Alginate Dressing, 2x2 (in/in) Discharge Instruction: Apply to wound bed as instructed Optifoam Non-Adhesive Dressing, 4x4 in Discharge Instruction: Apply to wound bed to pad toe Secondary Dressing Bordered Gauze, 2x2 in Discharge Instruction: Apply over primary dressing as directed. Secured With Compression Wrap Compression Stockings Add-Ons Electronic Signature(s) Signed: 07/31/2022 2:57:52 PM By: Brenton Grills Previous Signature: 07/28/2022 9:55:11 AM Version By: Dayton Scrape Entered By: Brenton Grills on 07/28/2022 10:19:04 -------------------------------------------------------------------------------- Wound Assessment Details Patient Name: Date of Service: [name], [place] [name] L 07/28/2022 9:30 A M Medical Record Number: [id number] Patient Account Number: [id number] Linda, [name] ([id number]) [phone].pdf Page 8 of 9 Date of Birth/Sex: Treating RN: December 21, 1930 (89 y.o. Gevena Mart Primary Care Kalima Saylor: Other Clinician: Romeo Rabon Referring Nomi Rudnicki: Treating Saverio Kader/Extender: [name] in Treatment: 18 Wound Status Wound Number: 13 Primary Lymphedema Etiology: Wound Location: Lateral Lower Leg Wound Open Wounding Event:  Contusion/Bruise Status: Date Acquired: 07/26/2022 Comorbid Cataracts, Lymphedema, Sleep Apnea, Coronary Artery Disease, Weeks Of Treatment: 0 History: Hypertension, Hepatitis B, Osteoarthritis Clustered Wound: No Photos Wound Measurements Length: (cm) 0.5 Width: (cm) 5.5 Depth: (cm) 0.1 Area: (cm) 2.16 Volume: (cm) 0.216 % Reduction in Area: % Reduction in Volume: Tunneling: No Undermining: No Wound Description Classification: Full Thickness Without Exposed Suppor Wound Margin: Distinct, outline attached Exudate Amount: Medium Exudate Type: Serosanguineous Exudate Color: red, brown t Structures Foul Odor After Cleansing: No Slough/Fibrino No Wound Bed Granulation Amount: Medium (34-66%) Exposed Structure Granulation Quality: Red Fat Layer (Subcutaneous Tissue) Exposed: Yes Necrotic Amount: Medium (34-66%) Necrotic Quality: Adherent Slough Periwound Skin Texture Texture Color No Abnormalities Noted: No No Abnormalities Noted: No Scarring: Yes Ecchymosis: Yes Moisture Temperature / Pain No Abnormalities Noted: No Temperature: No Abnormality Treatment Notes Wound #13 (Lower Leg) Wound Laterality: Lateral Cleanser Peri-Wound Care Topical Primary Dressing Maxorb Extra Ag+ Alginate Dressing, 4x4.75 (in/in) Discharge Instruction: Apply to wound bed as instructed Secondary Dressing Secured With Compression Wrap Urgo K2 Lite, (equivalent to a 3 layer) two layer compression system, regular Discharge Instruction: Apply Urgo K2 Lite as directed (alternative to 3 layer compression). [name], [name] ([id number]) [id number] Page 9 of 9 Compression Stockings Add-Ons Electronic Signature(s) Signed: 07/31/2022 2:57:52 PM By: Brenton Grills Entered By: Brenton Grills on 07/28/2022 09:56:45 -------------------------------------------------------------------------------- Vitals Details Patient Name: Date of Service: [name], [place] [name] L 07/28/2022 9:30 A  M Medical Record Number: [id number] Patient Account Number: [id number] Date of Birth/Sex: Treating RN: 19-Dec-1930 (89 y.o. F) Primary Care Mivaan Corbitt: Romeo Rabon Other Clinician: Referring Tex Conroy: Treating Alline Pio/Extender: [name] in Treatment: 18 Vital Signs Time Taken: 09:36 Temperature (F): 97.8 Height (in): 68 Pulse (bpm): 82 Weight (lbs): 180 Respiratory Rate (breaths/min): 18 Body Mass Index (BMI): 27.4 Blood Pressure (mmHg): 138/79 Reference Range: 80 - 120 mg / dl Electronic Signature(s) Signed: 07/28/2022 9:55:11 AM By: Dayton Scrape Entered By: Dayton Scrape on 07/28/2022 09:37:11

## 2022-08-04 ENCOUNTER — Encounter (HOSPITAL_BASED_OUTPATIENT_CLINIC_OR_DEPARTMENT_OTHER): Payer: Medicare Other | Attending: General Surgery | Admitting: General Surgery

## 2022-08-04 DIAGNOSIS — L97512 Non-pressure chronic ulcer of other part of right foot with fat layer exposed: Secondary | ICD-10-CM | POA: Diagnosis not present

## 2022-08-04 DIAGNOSIS — I872 Venous insufficiency (chronic) (peripheral): Secondary | ICD-10-CM | POA: Insufficient documentation

## 2022-08-04 DIAGNOSIS — L97522 Non-pressure chronic ulcer of other part of left foot with fat layer exposed: Secondary | ICD-10-CM | POA: Diagnosis present

## 2022-08-04 DIAGNOSIS — L84 Corns and callosities: Secondary | ICD-10-CM | POA: Insufficient documentation

## 2022-08-04 DIAGNOSIS — R600 Localized edema: Secondary | ICD-10-CM | POA: Insufficient documentation

## 2022-08-04 DIAGNOSIS — Z7952 Long term (current) use of systemic steroids: Secondary | ICD-10-CM | POA: Diagnosis not present

## 2022-08-04 DIAGNOSIS — L97812 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ed: Secondary | ICD-10-CM | POA: Diagnosis not present

## 2022-08-04 NOTE — Progress Notes (Signed)
VEIDA, RINGUETTE ([id number]) [id number] Page 1 of 11 Visit Report for 08/04/2022 Chief Complaint Document Details Patient Name: Date of Service: [name], [name] 08/04/2022 3:15 PM Medical Record Number: [id number] Patient Account Number: [id number] Date of Birth/Sex: Treating RN: 1930-09-26 (89 y.o. F) Primary Care Provider: Romeo Rabon Other Clinician: Referring Provider: Treating Provider/Extender: [name] in Treatment: 19 Information Obtained from: Patient Chief Complaint Patient presents for treatment of open ulcers due to venous insufficiency Electronic Signature(s) Signed: 08/04/2022 3:57:03 PM By: Duanne Guess MD FACS Entered By: Duanne Guess on 08/04/2022 15:57:03 -------------------------------------------------------------------------------- Debridement Details Patient Name: Date of Service: [name], [name] 08/04/2022 3:15 PM Medical Record Number: [id number] Patient Account Number: [id number] Date of Birth/Sex: Treating RN: 1930-12-30 (89 y.o. F) Primary Care Provider: Romeo Rabon Other Clinician: Referring Provider: Treating Provider/Extender: [name] in Treatment: 19 Debridement Performed for Assessment: Wound #12 Left T Fifth oe Performed By: Physician Duanne Guess, MD Debridement Type: Debridement Level of Consciousness (Pre-procedure): Awake and Alert Pre-procedure Verification/Time Out Yes - 15:46 Taken: Start Time: 15:47 Pain Control: Lidocaine 4% Topical Solution Percent of Wound Bed Debrided: 100% T Area Debrided (cm): otal 0.01 Tissue and other material debrided: Non-Viable, Callus, Slough, Slough Level: Non-Viable Tissue Debridement Description: Selective/Open Wound Instrument: Curette Bleeding: Minimum Hemostasis Achieved: Pressure End Time: 15:48 Procedural Pain: 0 Post Procedural Pain: 0 Response to Treatment: Procedure was tolerated  well Level of Consciousness (Post- Awake and Alert procedure): Post Debridement Measurements of Total Wound Length: (cm) 0.1 Width: (cm) 0.1 Depth: (cm) 0.1 Volume: (cm) 0.001 Character of Wound/Ulcer Post Debridement: Improved [place], [name] ([id number]) [id number] Page 2 of 11 Post Procedure Diagnosis Same as Pre-procedure Notes Scribed for Dr Lady Gary by Brenton Grills RN Electronic Signature(s) Signed: 08/04/2022 3:56:41 PM By: Duanne Guess MD FACS Entered By: Duanne Guess on 08/04/2022 15:56:41 -------------------------------------------------------------------------------- Debridement Details Patient Name: Date of Service: [name], [name] 08/04/2022 3:15 PM Medical Record Number: [id number] Patient Account Number: [id number] Date of Birth/Sex: Treating RN: 1930/05/04 (89 y.o. F) Primary Care Provider: Romeo Rabon Other Clinician: Referring Provider: Treating Provider/Extender: [name] in Treatment: 19 Debridement Performed for Assessment: Wound #13 Right,Lateral Lower Leg Performed By: Physician Duanne Guess, MD Debridement Type: Debridement Level of Consciousness (Pre-procedure): Awake and Alert Pre-procedure Verification/Time Out Yes - 15:46 Taken: Start Time: 15:47 Pain Control: Lidocaine 4% T opical Solution Percent of Wound Bed Debrided: 100% T Area Debrided (cm): otal 6.04 Tissue and other material debrided: Non-Viable, Slough, Slough Level: Non-Viable Tissue Debridement Description: Selective/Open Wound Instrument: Curette Bleeding: Minimum Hemostasis Achieved: Pressure End Time: 15:48 Procedural Pain: 0 Post Procedural Pain: 0 Response to Treatment: Procedure was tolerated well Level of Consciousness (Post- Awake and Alert procedure): Post Debridement Measurements of Total Wound Length: (cm) 1.4 Width: (cm) 5.5 Depth: (cm) 0.1 Volume: (cm) 0.605 Character of Wound/Ulcer Post  Debridement: Improved Post Procedure Diagnosis Same as Pre-procedure Notes Scribed for Dr Lady Gary by Brenton Grills RN Electronic Signature(s) Signed: 08/04/2022 3:56:54 PM By: Duanne Guess MD FACS Entered By: Duanne Guess on 08/04/2022 15:56:54 [name], [name] ([id number]) [id number] Page 3 of 11 -------------------------------------------------------------------------------- HPI Details Patient Name: Date of Service: [name], [place] 08/04/2022 3:15 PM Medical Record Number: [id number] Patient Account Number: [id number] Date of Birth/Sex: Treating RN: 03/19/1930 (89 y.o. F) Primary Care Provider: Romeo Rabon Other Clinician: Referring Provider: Treating Provider/Extender: [name] in Treatment: 19 History of Present Illness HPI Description: ADMISSION 03/20/2022 This  is an 89 year old woman with coronary artery disease, on chronic corticosteroids for osteoarthritis, who presents with bilateral lower leg ulcers. They have been present for a couple of weeks. I do not have any vascular studies available to discern whether or not she has had venous reflux or arterial studies done. ABIs in clinic were 1.34 on the left and 0.95 on the right. She is not diabetic. She does not smoke. Her legs are quite edematous and she has multiple hematomas of various sizes and ages extending up to at least the distal thigh. She has 3 ulcers on the left leg with slough and eschar accumulation. On the right leg, she has a tiny superficial ulcer on the anterior tibial surface. She also has a small ulcer on the lateral aspect of her right third toe. 03/27/2022: The ulcer on her right anterior tibial surface and lateral third toe are both healed. The ulcers on her left leg are smaller but have slough accumulation. Edema control is [name]. Her juxta lite stockings arrived in the mail, but she did not bring them with her today. 04/06/2022: She has a Linda  skin tear just distal to her left knee. The ulcer on her left upper posterior calf is nearly healed under a layer of eschar. She has a spot on her right second toe that she is concerned about, saying that she frequently gets an ulcer there, but I do not see any open wound in this site. 04/21/2022: All of her wounds are healed except for the skin tear distal to her left knee. There is a layer of eschar on the surface. Underneath, the wound is quite superficial and clean. 04/19/2022: All of her wounds are healed. She is wearing a juxta lite stocking on her right leg and has brought her other stocking for [place] to apply to her left. 05/22/2022: She returns today with Linda ulcers on her left leg. A large hematoma/blood blister on her upper lateral leg, just distal to the knee opened up shortly after she was healed out from our clinic. There is slough on the wound surface, but no concern for infection. She has a second ulcer on her anterior tibial surface with slough on the surface. The small site on her right second toe that we have been monitoring has opened. There is some slough in the center with surrounding callus. She has a hematoma/blood blister on the medial aspect of her right lower leg that is not open, but it is certainly threatening to do so. 05/29/2022: The hematoma that had threatened to open last week is now open. There is slough and hematoma and nonviable subcutaneous tissue present. The toe ulcer is down to a small pinhole, but there is some undermining from some dry skin present. The ulcers on her left leg are both smaller. There is some slough accumulation on both. 06/05/2022: The more proximal of the left leg ulcers is closed by about 50%. There is some eschar on the wound surface. The anterior left leg ulcer is nearly healed with just a little eschar on the remaining open portion. The lateral foot ulcer has some eschar and slough present the most recent right lower leg wound has slough and fat  exposed. The right second toe ulcer looks about the same with a layer of callus and slough. 06/13/2022: The anterior left leg ulcer is closed. The proximal left lower leg ulcer is down to just about a centimeter with some slough and eschar on the surface. The right foot ulcer is  healed. The right anterior lower leg wound continues to accumulate slough but is a little bit smaller today. The right second toe ulcer is smaller underneath the layer of callus and slough. 06/19/2022: The proximal left lower leg ulcer is down to just a couple of millimeters. It is clean. The right anterior lower leg wound is smaller again today, but still with some slough buildup. The right second toe ulcer is down to just a pinpoint opening underneath some callus. 07/03/2022: The left lower leg ulcer is closed. The right anterior lower leg wound is smaller again this week. There is a little bit of light slough accumulation. The right second toe ulcer is unchanged underneath its layer of callus. She is also complaining of pain related to a callus in her midfoot as well as one on her fifth toe. 07/10/2022: The right anterior lower leg wound is nearly closed underneath the layer of eschar. The right second toe ulcer appears to have healed. The fifth toe callus on her left foot has reaccumulated and she says it is causing her pain. After debridement, I discovered an ulcer at the base of this callus. 07/17/2022: The right anterior lower leg wound is closed. The right second toe ulcer remains closed. The fifth toe ulcer on her left foot has reaccumulated some callus. The small wound remains open with some slough buildup. 07/28/2022: She has a Linda skin tear on her right lateral lower leg. She says it happened while she was pulling up her stockings, but denies snagging it with her jewelry or a fingernail. The skin flap seems to be at least partially adhered down in the exposed area is clean, but it does extend to the fat layer. The wound  on her lateral fifth toe on the left is nearly closed under a layer of callus. The right second toe wound has not reopened. 08/04/2022: Much of the skin at the site of her skin tear has survived. There is still a band of tissue that appears compromised and may not ultimately survive. There is slough on the wound surface. The left fifth toe has a tiny pinhole opening that remains under some callus and slough. There is an area on her right fifth toe, mirroring the left, that is somewhat discolored but not yet open. Electronic Signature(s) Signed: 08/04/2022 3:58:24 PM By: Duanne Guess MD FACS Entered By: Duanne Guess on 08/04/2022 15:58:23 Physical Exam Details -------------------------------------------------------------------------------- [name] ([id number]) [id number] Page 4 of 11 Patient Name: Date of Service: [name], [place] RO Elbert Ewings 08/04/2022 3:15 PM Medical Record Number: [id number] Patient Account Number: [id number] Date of Birth/Sex: Treating RN: Mar 08, 1930 (89 y.o. F) Primary Care Provider: Romeo Rabon Other Clinician: Referring Provider: Treating Provider/Extender: [name] in Treatment: 19 Constitutional Slightly hypertensive. . . . no acute distress. Respiratory Normal work of breathing on room air. Notes 08/04/2022: Much of the skin at the site of her skin tear has survived. There is still a band of tissue that appears compromised and may not ultimately survive. There is slough on the wound surface. The left fifth toe has a tiny pinhole opening that remains under some callus and slough. There is an area on her right fifth toe, mirroring the left, that is somewhat discolored but not yet open. Electronic Signature(s) Signed: 08/04/2022 3:59:18 PM By: Duanne Guess MD FACS Entered By: Duanne Guess on 08/04/2022 15:59:18 -------------------------------------------------------------------------------- Physician  Orders Details Patient Name: Date of Service: [name], [name] 08/04/2022 3:15 PM Medical Record Number: [id number]  Patient Account Number: [id number] Date of Birth/Sex: Treating RN: 02-Jun-1930 (89 y.o. [name] Primary Care Provider: Romeo Rabon Other Clinician: Referring Provider: Treating Provider/Extender: [name] in Treatment: 19 Verbal / Phone Orders: No Diagnosis Coding ICD-10 Coding Code Description [phone] Non-pressure chronic ulcer of other part of left foot with fat layer exposed L97.812 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ed R60.0 Localized edema I87.2 Venous insufficiency (chronic) (peripheral) Z79.52 Long term (current) use of systemic steroids L84 Corns and callosities Follow-up Appointments ppointment in 2 weeks. - Dr. Lady Gary - Room 3 Return A Bathing/ Shower/ Hygiene May shower with protection but do not get wound dressing(s) wet. Protect dressing(s) with water repellant cover (for example, large plastic bag) or a cast cover and may then take shower. Edema Control - Lymphedema / SCD / Other Elevate legs to the level of the heart or above for 30 minutes daily and/or when sitting for 3-4 times a day throughout the day. Avoid standing for long periods of time. Exercise regularly Moisturize legs daily. - left leg daily Compression stocking or Garment 20-30 mm/Hg pressure to: - juxtalite to left leg daily Wound Treatment Wound #12 - T Fifth oe Wound Laterality: Left Cleanser: Soap and Water Every Other Day/30 Days Discharge Instructions: May shower and wash wound with dial antibacterial soap and water prior to dressing change. Cleanser: Wound Cleanser Every Other Day/30 Days Discharge Instructions: Cleanse the wound with wound cleanser prior to applying a clean dressing using gauze sponges, not tissue or cotton balls. Prim Dressing: Maxorb Extra Ag+ Alginate Dressing, 2x2  (in/in) ary Every Other Day/30 Days [name], [place] ([id number]) [id number] Page 5 of 11 Discharge Instructions: Apply to wound bed as instructed Prim Dressing: Optifoam Non-Adhesive Dressing, 4x4 in Every Other Day/30 Days ary Discharge Instructions: Apply to wound bed to pad toe Secondary Dressing: Bordered Gauze, 2x2 in Every Other Day/30 Days Discharge Instructions: Apply over primary dressing as directed. Wound #13 - Lower Leg Wound Laterality: Right, Lateral Prim Dressing: Maxorb Extra Ag+ Alginate Dressing, 4x4.75 (in/in) ary Discharge Instructions: Apply to wound bed as instructed Compression Wrap: Urgo K2 Lite, (equivalent to a 3 layer) two layer compression system, regular Discharge Instructions: Apply Urgo K2 Lite as directed (alternative to 3 layer compression). Electronic Signature(s) Signed: 08/04/2022 4:03:57 PM By: Duanne Guess MD FACS Entered By: Duanne Guess on 08/04/2022 16:00:20 -------------------------------------------------------------------------------- Problem List Details Patient Name: Date of Service: [name], [name] 08/04/2022 3:15 PM Medical Record Number: [id number] Patient Account Number: [id number] Date of Birth/Sex: Treating RN: 11-01-1930 (89 y.o. F) Primary Care Provider: Romeo Rabon Other Clinician: Referring Provider: Treating Provider/Extender: [name] in Treatment: 19 Active Problems ICD-10 Encounter Code Description Active Date MDM Diagnosis [phone] Non-pressure chronic ulcer of other part of left foot with fat layer exposed 07/10/2022 No Yes L97.812 Non-pressure chronic ulcer of other part of right lower leg with fat layer 07/28/2022 No Yes exposed R60.0 Localized edema 03/20/2022 No Yes I87.2 Venous insufficiency (chronic) (peripheral) 03/20/2022 No Yes Z79.52 Long term (current) use of systemic steroids 03/20/2022 No Yes L84 Corns and callosities 06/05/2022 No Yes Inactive  Problems Resolved Problems ICD-10 Linda, [name] ([id number]) [id number] Page 6 of 11 Code Description Active Date Resolved Date L97.811 Non-pressure chronic ulcer of other part of right lower leg limited to breakdown of skin 03/20/2022 03/20/2022 L97.822 Non-pressure chronic ulcer of other part of left lower leg with fat layer exposed 05/22/2022 03/20/2022 L97.511 Non-pressure  chronic ulcer of other part of right foot limited to breakdown of skin 03/20/2022 03/20/2022 L97.512 Non-pressure chronic ulcer of other part of right foot with fat layer exposed 05/22/2022 05/22/2022 L97.812 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ed 05/29/2022 05/29/2022 Electronic Signature(s) Signed: 08/04/2022 3:52:12 PM By: Duanne Guess MD FACS Entered By: Duanne Guess on 08/04/2022 15:52:12 -------------------------------------------------------------------------------- Progress Note Details Patient Name: Date of Service: [name], [name] 08/04/2022 3:15 PM Medical Record Number: [id number] Patient Account Number: [id number] Date of Birth/Sex: Treating RN: 04-29-1930 (89 y.o. F) Primary Care Provider: Romeo Rabon Other Clinician: Referring Provider: Treating Provider/Extender: [name] in Treatment: 19 Subjective Chief Complaint Information obtained from Patient Patient presents for treatment of open ulcers due to venous insufficiency History of Present Illness (HPI) ADMISSION 03/20/2022 This is an 89 year old woman with coronary artery disease, on chronic corticosteroids for osteoarthritis, who presents with bilateral lower leg ulcers. They have been present for a couple of weeks. I do not have any vascular studies available to discern whether or not she has had venous reflux or arterial studies done. ABIs in clinic were 1.34 on the left and 0.95 on the right. She is not diabetic. She does not smoke. Her  legs are quite edematous and she has multiple hematomas of various sizes and ages extending up to at least the distal thigh. She has 3 ulcers on the left leg with slough and eschar accumulation. On the right leg, she has a tiny superficial ulcer on the anterior tibial surface. She also has a small ulcer on the lateral aspect of her right third toe. 03/27/2022: The ulcer on her right anterior tibial surface and lateral third toe are both healed. The ulcers on her left leg are smaller but have slough accumulation. Edema control is [name]. Her juxta lite stockings arrived in the mail, but she did not bring them with her today. 04/06/2022: She has a Linda skin tear just distal to her left knee. The ulcer on her left upper posterior calf is nearly healed under a layer of eschar. She has a spot on her right second toe that she is concerned about, saying that she frequently gets an ulcer there, but I do not see any open wound in this site. 04/21/2022: All of her wounds are healed except for the skin tear distal to her left knee. There is a layer of eschar on the surface. Underneath, the wound is quite superficial and clean. 04/19/2022: All of her wounds are healed. She is wearing a juxta lite stocking on her right leg and has brought her other stocking for [place] to apply to her left. 05/22/2022: She returns today with Linda ulcers on her left leg. A large hematoma/blood blister on her upper lateral leg, just distal to the knee opened up shortly after she was healed out from our clinic. There is slough on the wound surface, but no concern for infection. She has a second ulcer on her anterior tibial surface with slough on the surface. The small site on her right second toe that we have been monitoring has opened. There is some slough in the center with surrounding callus. She has a hematoma/blood blister on the medial aspect of her right lower leg that is not open, but it is certainly threatening to do so. 05/29/2022: The  hematoma that had threatened to open last week is now open. There is slough and hematoma and nonviable subcutaneous tissue present. The toe  ulcer is down to a small pinhole, but there is some undermining from some dry skin present. The ulcers on her left leg are both smaller. There is some slough accumulation on both. 06/05/2022: The more proximal of the left leg ulcers is closed by about 50%. There is some eschar on the wound surface. The anterior left leg ulcer is nearly healed with just a little eschar on the remaining open portion. The lateral foot ulcer has some eschar and slough present the most recent right lower leg wound has slough and fat exposed. The right second toe ulcer looks about the same with a layer of callus and slough. 06/13/2022: The anterior left leg ulcer is closed. The proximal left lower leg ulcer is down to just about a centimeter with some slough and eschar on the surface. Linda, [name] ([id number]) [id number] Page 7 of 11 The right foot ulcer is healed. The right anterior lower leg wound continues to accumulate slough but is a little bit smaller today. The right second toe ulcer is smaller underneath the layer of callus and slough. 06/19/2022: The proximal left lower leg ulcer is down to just a couple of millimeters. It is clean. The right anterior lower leg wound is smaller again today, but still with some slough buildup. The right second toe ulcer is down to just a pinpoint opening underneath some callus. 07/03/2022: The left lower leg ulcer is closed. The right anterior lower leg wound is smaller again this week. There is a little bit of light slough accumulation. The right second toe ulcer is unchanged underneath its layer of callus. She is also complaining of pain related to a callus in her midfoot as well as one on her fifth toe. 07/10/2022: The right anterior lower leg wound is nearly closed underneath the layer of eschar. The right second toe  ulcer appears to have healed. The fifth toe callus on her left foot has reaccumulated and she says it is causing her pain. After debridement, I discovered an ulcer at the base of this callus. 07/17/2022: The right anterior lower leg wound is closed. The right second toe ulcer remains closed. The fifth toe ulcer on her left foot has reaccumulated some callus. The small wound remains open with some slough buildup. 07/28/2022: She has a Linda skin tear on her right lateral lower leg. She says it happened while she was pulling up her stockings, but denies snagging it with her jewelry or a fingernail. The skin flap seems to be at least partially adhered down in the exposed area is clean, but it does extend to the fat layer. The wound on her lateral fifth toe on the left is nearly closed under a layer of callus. The right second toe wound has not reopened. 08/04/2022: Much of the skin at the site of her skin tear has survived. There is still a band of tissue that appears compromised and may not ultimately survive. There is slough on the wound surface. The left fifth toe has a tiny pinhole opening that remains under some callus and slough. There is an area on her right fifth toe, mirroring the left, that is somewhat discolored but not yet open. Patient History Information obtained from Patient. Family History Heart Disease - Father, Hypertension - Mother, No family history of Cancer, Diabetes, Hereditary Spherocytosis, Kidney Disease, Lung Disease, Seizures, Stroke, Thyroid Problems, Tuberculosis. Social History Never smoker, Marital Status - Married, Alcohol Use - Rarely, Drug Use - No History, Caffeine Use - Moderate. Medical History Eyes  Patient has history of Cataracts Hematologic/Lymphatic Patient has history of Lymphedema Respiratory Patient has history of Sleep Apnea Cardiovascular Patient has history of Coronary Artery Disease, Hypertension Gastrointestinal Patient has history of Hepatitis B -  50+ yrs ago Musculoskeletal Patient has history of Osteoarthritis Hospitalization/Surgery History - abdominal hysterectomy. - cardiac catheterization. - cataract extraction bilat.. - colonoscopy. - bilateral total knee replacement. - upper GI endoscopy. Medical A Surgical History Notes nd Gastrointestinal diverticulosis, GERD Musculoskeletal osteoporosis Objective Constitutional Slightly hypertensive. no acute distress. Vitals Time Taken: 3:18 PM, Height: 68 in, Weight: 180 lbs, BMI: 27.4, Temperature: 98 F, Pulse: 66 bpm, Respiratory Rate: 18 breaths/min, Blood Pressure: 149/75 mmHg. Respiratory Normal work of breathing on room air. General Notes: 08/04/2022: Much of the skin at the site of her skin tear has survived. There is still a band of tissue that appears compromised and may not ultimately survive. There is slough on the wound surface. The left fifth toe has a tiny pinhole opening that remains under some callus and slough. There is an area on her right fifth toe, mirroring the left, that is somewhat discolored but not yet open. Integumentary (Hair, Skin) Wound #12 status is Open. Original cause of wound was Gradually Appeared. The date acquired was: 07/10/2022. The wound has been in treatment 3 weeks. The wound is located on the Left T Fifth. The wound measures 0.1cm length x 0.1cm width x 0.1cm depth; 0.008cm^2 area and 0.001cm^3 volume. There is oe no tunneling or undermining noted. There is a none present amount of drainage noted. The wound margin is distinct with the outline attached to the wound base. There is no granulation within the wound bed. There is a large (67-100%) amount of necrotic tissue within the wound bed including Eschar. The periwound skin appearance had no abnormalities noted for moisture. The periwound skin appearance had no abnormalities noted for color. The periwound skin appearance HYDIA, [name] ([id number]) [id number] Page 8 of  11 exhibited: Callus. Periwound temperature was noted as No Abnormality. Wound #13 status is Open. Original cause of wound was Contusion/Bruise. The date acquired was: 07/26/2022. The wound has been in treatment 1 weeks. The wound is located on the Right,Lateral Lower Leg. The wound measures 1.4cm length x 5.5cm width x 0.1cm depth; 6.048cm^2 area and 0.605cm^3 volume. There is Fat Layer (Subcutaneous Tissue) exposed. There is no tunneling or undermining noted. There is a medium amount of serosanguineous drainage noted. The wound margin is distinct with the outline attached to the wound base. There is medium (34-66%) red granulation within the wound bed. There is a medium (34-66%) amount of necrotic tissue within the wound bed including Adherent Slough. The periwound skin appearance exhibited: Scarring, Ecchymosis. Periwound temperature was noted as No Abnormality. 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 There was a Selective/Open Wound Non-Viable Tissue Debridement oe with a total area of 0.01 sq cm performed by Duanne Guess, MD. With the following instrument(s): Curette to remove Non-Viable tissue/material. Material removed includes Callus and Slough and after achieving pain control using Lidocaine 4% T opical Solution. No specimens were taken. A time out was conducted at 15:46, prior to the start of the procedure. A  Minimum amount of bleeding was controlled with Pressure. The procedure was tolerated well with a pain level of 0 throughout and a pain level of 0 following the procedure. Post Debridement  Measurements: 0.1cm length x 0.1cm width x 0.1cm depth; 0.001cm^3 volume. Character of Wound/Ulcer Post Debridement is improved. Post procedure Diagnosis Wound #12: Same as Pre-Procedure General Notes: Scribed for Dr Lady Gary by Brenton Grills RN. Wound #13 Pre-procedure diagnosis of Wound #13 is a Lymphedema located on the Right,Lateral Lower Leg . There was a Selective/Open Wound Non-Viable Tissue Debridement with a total area of 6.04 sq cm performed by Duanne Guess, MD. With the following instrument(s): Curette to remove Non-Viable tissue/material. Material removed includes [facility] after achieving pain control using Lidocaine 4% Topical Solution. No specimens were taken. A time out was conducted at 15:46, prior to the start of the procedure. A Minimum amount of bleeding was controlled with Pressure. The procedure was tolerated well with a pain level of 0 throughout and a pain level of 0 following the procedure. Post Debridement Measurements: 1.4cm length x 5.5cm width x 0.1cm depth; 0.605cm^3 volume. Character of Wound/Ulcer Post Debridement is improved. Post procedure Diagnosis Wound #13: Same as Pre-Procedure General Notes: Scribed for Dr Lady Gary by Brenton Grills RN. Plan Follow-up Appointments: Return Appointment in 2 weeks. - Dr. Lady Gary - Room 3 Bathing/ Shower/ Hygiene: May shower with protection but do not get wound dressing(s) wet. Protect dressing(s) with water repellant cover (for example, large plastic bag) or a cast cover and may then take shower. Edema Control - Lymphedema / SCD / Other: Elevate legs to the level of the heart or above for 30 minutes daily and/or when sitting for 3-4 times a day throughout the day. Avoid standing for long periods of time. Exercise regularly Moisturize legs daily. - left leg daily Compression stocking or Garment 20-30 mm/Hg pressure to: - juxtalite to left leg daily WOUND #12: - T Fifth Wound Laterality: Left oe Cleanser: Soap and Water Every  Other Day/30 Days Discharge Instructions: May shower and wash wound with dial antibacterial soap and water prior to dressing change. Cleanser: Wound Cleanser Every Other Day/30 Days Discharge Instructions: Cleanse the wound with wound cleanser prior to applying a clean dressing using gauze sponges, not tissue or cotton balls. Prim Dressing: Maxorb Extra Ag+ Alginate Dressing, 2x2 (in/in) Every Other Day/30 Days ary Discharge Instructions: Apply to wound bed as instructed Prim Dressing: Optifoam Non-Adhesive Dressing, 4x4 in Every Other Day/30 Days ary Discharge Instructions: Apply to wound bed to pad toe Secondary Dressing: Bordered Gauze, 2x2 in Every Other Day/30 Days Discharge Instructions: Apply over primary dressing as directed. WOUND #13: - Lower Leg Wound Laterality: Right, Lateral Prim Dressing: Maxorb Extra Ag+ Alginate Dressing, 4x4.75 (in/in) ary Discharge Instructions: Apply to wound bed as instructed Com pression Wrap: Urgo K2 Lite, (equivalent to a 3 layer) two layer compression system, regular Discharge Instructions: Apply Urgo K2 Lite as directed (alternative to 3 layer compression). [name], [name] ([id number]) [id number] Page 9 of 11 08/04/2022: Much of the skin at the site of her skin tear has survived. There is still a band of tissue that appears compromised and may not ultimately survive. There is slough on the wound surface. The left fifth toe has a tiny pinhole opening that remains under some callus and slough. There is an area on her right fifth toe, mirroring the left, that is somewhat discolored but not yet open. I used a curette to debride slough from the right lateral leg wound. Debrided  callus and slough from the left fifth toe wound. Due to the potential for opening up the right fifth toe, we are going to pad it with an Optifoam donut. Continue silver alginate and OPTi foam donut to the left fifth toe and silver alginate and 3 layer  compression/equivalent to the right leg. Follow-up in 1 week. Electronic Signature(s) Signed: 08/04/2022 4:01:18 PM By: Duanne Guess MD FACS Entered By: Duanne Guess on 08/04/2022 16:01:18 -------------------------------------------------------------------------------- HxROS Details Patient Name: Date of Service: [name], [name] 08/04/2022 3:15 PM Medical Record Number: [id number] Patient Account Number: [id number] Date of Birth/Sex: Treating RN: [birth year] (89 y.o. F) Primary Care Provider: Romeo Rabon Other Clinician: Referring Provider: Treating Provider/Extender: [name] in Treatment: 19 Information Obtained From Patient Eyes Medical History: Positive for: Cataracts Hematologic/Lymphatic Medical History: Positive for: Lymphedema Respiratory Medical History: Positive for: Sleep Apnea Cardiovascular Medical History: Positive for: Coronary Artery Disease; Hypertension Gastrointestinal Medical History: Positive for: Hepatitis B - 50+ yrs ago Past Medical History Notes: diverticulosis, GERD Musculoskeletal Medical History: Positive for: Osteoarthritis Past Medical History Notes: osteoporosis HBO Extended History Items Eyes: Cataracts Immunizations Pneumococcal Vaccine: Received Pneumococcal Vaccination: Yes Received Pneumococcal Vaccination On or After [address]Almedina Northcott, [name] ([id number]) [id number] Page 10 of 11 Implantable Devices No devices added Hospitalization / Surgery History Type of Hospitalization/Surgery abdominal hysterectomy cardiac catheterization cataract extraction bilat. colonoscopy bilateral total knee replacement upper GI endoscopy Family and Social History Cancer: No; Diabetes: No; Heart Disease: Yes - Father; Hereditary Spherocytosis: No; Hypertension: Yes - Mother; Kidney Disease: No; Lung Disease: No; Seizures: No; Stroke: No; Thyroid Problems: No;  Tuberculosis: No; Never smoker; Marital Status - Married; Alcohol Use: Rarely; Drug Use: No History; Caffeine Use: Moderate; Financial Concerns: No; Food, Clothing or Shelter Needs: No; Support System Lacking: No; Transportation Concerns: No Electronic Signature(s) Signed: 08/04/2022 4:03:57 PM By: Duanne Guess MD FACS Entered By: Duanne Guess on 08/04/2022 15:58:55 -------------------------------------------------------------------------------- SuperBill Details Patient Name: Date of Service: [name], [name] 08/04/2022 Medical Record Number: [id number] Patient Account Number: [id number] Date of Birth/Sex: Treating RN: 03-03-1930 (89 y.o. [name] Primary Care Provider: Romeo Rabon Other Clinician: Referring Provider: Treating Provider/Extender: [name] in Treatment: 19 Diagnosis Coding ICD-10 Codes Code Description [phone] Non-pressure chronic ulcer of other part of left foot with fat layer exposed L97.812 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ed R60.0 Localized edema I87.2 Venous insufficiency (chronic) (peripheral) Z79.52 Long term (current) use of systemic steroids L84 Corns and callosities Facility Procedures : CPT4 Code: [id number] Description: 97597 - DEBRIDE WOUND 1ST 20 SQ CM OR < ICD-10 Diagnosis Description L97.522 Non-pressure chronic ulcer of other part of left foot with fat layer exposed L97.812 Non-pressure chronic ulcer of other part of right lower leg with fat layer  expos Modifier: ed Quantity: 1 Physician Procedures : CPT4 Code Description Modifier [id number] - WC PHYS LEVEL 3 - EST PT 25 ICD-10 Diagnosis Description L97.522 Non-pressure chronic ulcer of other part of left foot with fat layer exposed L97.812 Non-pressure chronic ulcer of other part of right lower  leg with fat layer exposed Z79.52 Long term (current) use of systemic steroids I87.2 Venous insufficiency  (chronic) (peripheral) Quantity: 1 : [id number] 97597 - WC PHYS DEBR WO ANESTH 20 SQ CM ICD-10 Diagnosis Description L97.522 Non-pressure chronic ulcer of other part of left foot with fat layer exposed L97.812 Non-pressure chronic ulcer of other part of right lower leg with  fat layer exposed  [name], [name] ([id number]) [id number] Quantity: 1 Page 11 of 11 Electronic Signature(s) Signed: 08/04/2022 4:01:45 PM By: Duanne Guess MD FACS Entered By: Duanne Guess on 08/04/2022 16:01:45

## 2022-08-04 NOTE — Progress Notes (Signed)
QUIERA, BARBERIO ([id number]) [id number] Page 1 of 9 Visit Report for 08/04/2022 Arrival Information Details Patient Name: Date of Service: [name], [place] 08/04/2022 3:15 PM Medical Record Number: [id number] Patient Account Number: [id number] Date of Birth/Sex: Treating RN: 07/31/1930 (89 y.o. [name] Primary Care Domini [name]: Romeo [name] Other Clinician: Referring Dre Gamino: Treating Sona Nations/Extender: [name] in Treatment: 19 Visit Information History Since Last Visit Added or deleted any medications: No Patient Arrived: [name] Any new allergies or adverse reactions: No Arrival Time: 15:16 Had a fall or experienced change in No Accompanied By: husband activities of daily living that may affect Transfer Assistance: Manual risk of falls: Patient Identification Verified: Yes Signs or symptoms of abuse/neglect since last visito No Secondary Verification Process Completed: Yes Hospitalized since last visit: No Patient Requires Transmission-Based Precautions: No Implantable device outside of the clinic excluding No Patient Has Alerts: No cellular tissue based products placed in the center since last visit: Has Dressing in Place as Prescribed: Yes Has Compression in Place as Prescribed: Yes Pain Present Now: No Electronic Signature(s) Signed: 08/04/2022 3:40:23 PM By: Samuella Bruin Entered By: Samuella Bruin on 08/04/2022 15:16:48 -------------------------------------------------------------------------------- Compression Therapy Details Patient Name: Date of Service: [name], [name] 08/04/2022 3:15 PM Medical Record Number: [id number] Patient Account Number: [id number] Date of Birth/Sex: Treating RN: 1930-08-03 (89 y.o. [name] Other Clinician: Referring Tracina Beaumont: Treating Safiatou Islam/Extender: [name] in Treatment:  19 Compression Therapy Performed for Wound Assessment: Wound #13 Right,Lateral Lower Leg Performed By: Clinician Brenton Grills, RN Compression Type: Three Layer Post Procedure Diagnosis Same as Pre-procedure Electronic Signature(s) Unsigned Entered By: Brenton Grills on 08/04/2022 16:16:37 Signature(s): [place], [name] ([id number]) [id number] Date(s): [id number] Page 2 of 9 -------------------------------------------------------------------------------- Encounter Discharge Information Details Patient Name: Date of Service: [name], [place] 08/04/2022 3:15 PM Medical Record Number: [id number] Patient Account Number: [id number] Date of Birth/Sex: Treating RN: 1930/06/22 (89 y.o. [name] Primary Care Janey Petron: Romeo [name] Other Clinician: Referring Pieter Fooks: Treating Ronald Londo/Extender: [name] in Treatment: 19 Encounter Discharge Information Items Post Procedure Vitals Discharge Condition: Stable Temperature (F): 98.2 Ambulatory Status: Walker Pulse (bpm): 78 Discharge Destination: Home Respiratory Rate (breaths/min): 18 Transportation: Private Auto Blood Pressure (mmHg): 168/72 Accompanied By: spouse Schedule Follow-up Appointment: Yes Clinical Summary of Care: Patient Declined Electronic Signature(s) Unsigned Entered By: Brenton Grills on 08/04/2022 16:06:11 -------------------------------------------------------------------------------- Lower Extremity Assessment Details Patient Name: Date of Service: [name] 08/04/2022 3:15 PM Medical Record Number: [id number] Patient Account Number: [id number] Date of Birth/Sex: Treating RN: Feb 27, 1930 (89 y.o. [name] Primary Care Oluwaseyi Raffel: Romeo [name] Other Clinician: Referring Jaiana Sheffer: Treating Jina Olenick/Extender: [name] in Treatment: 19 Edema Assessment Assessed: [name]: No] [name]: No] Edema: [Left: Yes] [Right:  Yes] Calf Left: Right: Point of Measurement: From Medial Instep 30.5 cm 32.5 cm Ankle Left: Right: Point of Measurement: From Medial Instep 20 cm 20 cm Vascular Assessment Pulses: Dorsalis Pedis Palpable: [Right:Yes] Electronic Signature(s) Signed: 08/04/2022 3:40:23 PM By: Samuella Bruin Entered By: Samuella Bruin on 08/04/2022 15:23:09 Uphoff, [name] ([id number]) [id number] Page 3 of 9 -------------------------------------------------------------------------------- Multi Wound Chart Details Patient Name: Date of Service: [name], [place] 08/04/2022 3:15 PM Medical Record Number: [id number] Patient Account Number: [id number] Date of Birth/Sex: Treating RN: 1930/09/26 (89 y.o. F) Primary Care Copelyn Widmer: Romeo [name] Other Clinician: Referring Larna Capelle: Treating Jance Siek/Extender: [name] in Treatment: 19 Vital Signs Height(in): 68  Pulse(bpm): 66 Weight(lbs): 180 Blood Pressure(mmHg): 149/75 Body Mass Index(BMI): 27.4 Temperature(F): 98 Respiratory Rate(breaths/min): 18 [12:Photos:] [N/A:N/A] Left T Fifth oe Right, Lateral Lower Leg N/A Wound Location: Gradually Appeared Contusion/Bruise N/A Wounding Event: Lymphedema Lymphedema N/A Primary Etiology: Cataracts, Lymphedema, Sleep Cataracts, Lymphedema, Sleep N/A Comorbid History: Apnea, Coronary Artery Disease, Apnea, Coronary Artery Disease, Hypertension, Hepatitis B, Hypertension, Hepatitis B, Osteoarthritis Osteoarthritis 07/10/2022 07/26/2022 N/A Date Acquired: 3 1 N/A Weeks of Treatment: Open Open N/A Wound Status: No No N/A Wound Recurrence: 0.1x0.1x0.1 1.4x5.5x0.1 N/A Measurements L x W x D (cm) 0.008 6.048 N/A A (cm) : rea 0.001 0.605 N/A Volume (cm) : 74.20% -180.00% N/A % Reduction in A rea: 66.70% -180.10% N/A % Reduction in Volume: Full Thickness Without Exposed Full Thickness Without Exposed N/A Classification: Support  Structures Support Structures None Present Medium N/A Exudate A mount: N/A Serosanguineous N/A Exudate Type: N/A red, brown N/A Exudate Color: Distinct, outline attached Distinct, outline attached N/A Wound Margin: None Present (0%) Medium (34-66%) N/A Granulation A mount: N/A Red N/A Granulation Quality: Large (67-100%) Medium (34-66%) N/A Necrotic A mount: Eschar Adherent Slough N/A Necrotic Tissue: Fascia: No Fat Layer (Subcutaneous Tissue): Yes N/A Exposed Structures: Fat Layer (Subcutaneous Tissue): No Tendon: No Muscle: No Joint: No Bone: No Large (67-100%) Small (1-33%) N/A Epithelialization: Debridement - Selective/Open Wound Debridement - Selective/Open Wound N/A Debridement: Pre-procedure Verification/Time Out 15:46 15:46 N/A Taken: Lidocaine 4% Topical Solution Lidocaine 4% Topical Solution N/A Pain Control: [employer] N/A Tissue Debrided: Non-Viable Tissue Non-Viable Tissue N/A Level: 0.01 6.04 N/A Debridement A (sq cm): rea Curette Curette N/A Instrument: Minimum Minimum N/A Bleeding: Pressure Pressure N/A Hemostasis A chieved: 0 0 N/A Procedural Pain: 0 0 N/A Post Procedural Pain: Procedure was tolerated well Procedure was tolerated well N/A Debridement Treatment Response: 0.1x0.1x0.1 1.4x5.5x0.1 N/A Post Debridement Measurements L x Seward, Kenzleigh ([id number]) [id number].pdf Page 4 of 9 W x D (cm) 0.001 0.605 N/A Post Debridement Volume: (cm) Callus: Yes Scarring: Yes N/A Periwound Skin Texture: No Abnormalities Noted N/A Periwound Skin Moisture: No Abnormalities Noted Ecchymosis: Yes N/A Periwound Skin Color: No Abnormality No Abnormality N/A Temperature: Debridement Debridement N/A Procedures Performed: Treatment Notes Electronic Signature(s) Signed: 08/04/2022 3:52:21 PM By: Duanne Guess MD FACS Entered By: Duanne Guess on 08/04/2022  15:52:21 -------------------------------------------------------------------------------- Multi-Disciplinary Care Plan Details Patient Name: Date of Service: [name], [name] 08/04/2022 3:15 PM Medical Record Number: [id number] Patient Account Number: [id number] Date of Birth/Sex: Treating RN: 1930/07/24 (89 y.o. [name] Other Clinician: Referring Lawanna Cecere: Treating Deira Shimer/Extender: [name] in Treatment: 19 Multidisciplinary Care Plan reviewed with physician Active Inactive Abuse / Safety / Falls / Self Care Management Nursing Diagnoses: History of Falls Impaired physical mobility Potential for falls Goals: Patient will not experience any injury related to falls Date Initiated: 03/20/2022 Date Inactivated: 05/29/2022 Target Resolution Date: 05/05/2022 Goal Status: Met Patient/caregiver will demonstrate safe use of adaptive devices to increase mobility Date Initiated: 03/20/2022 Date Inactivated: 04/27/2022 Target Resolution Date: 05/05/2022 Goal Status: Met Patient/caregiver will verbalize/demonstrate measures taken to prevent injury and/or falls Date Initiated: 05/29/2022 Target Resolution Date: 08/30/2022 Goal Status: Active Interventions: Assess: immobility, friction, shearing, incontinence upon admission and as needed Provide education on fall prevention Notes: Venous Leg Ulcer Nursing Diagnoses: Knowledge deficit related to disease process and management Potential for venous Insuffiency (use before diagnosis confirmed) Goals: Patient will maintain optimal edema control Date Initiated: 05/22/2022 Target Resolution Date: 08/30/2022 Goal Status: Active Interventions: Assess peripheral edema  status every visit. Compression as ordered Provide education on venous insufficiency [place], [name] ([id number]) [id number] Page 5 of 9 Treatment Activities: Therapeutic compression  applied : 05/22/2022 Notes: Wound/Skin Impairment Nursing Diagnoses: Impaired tissue integrity Knowledge deficit related to ulceration/compromised skin integrity Goals: Patient/caregiver will verbalize understanding of skin care regimen Date Initiated: 03/20/2022 Target Resolution Date: 08/30/2022 Goal Status: Active Interventions: Assess ulceration(s) every visit Treatment Activities: Skin care regimen initiated : 03/20/2022 Topical wound management initiated : 03/20/2022 Notes: Electronic Signature(s) Unsigned Entered By: Brenton Grills on 08/04/2022 15:44:47 -------------------------------------------------------------------------------- Pain Assessment Details Patient Name: Date of Service: [name] 08/04/2022 3:15 PM Medical Record Number: [id number] Patient Account Number: [id number] Date of Birth/Sex: Treating RN: 1931-01-07 (89 y.o. [name] Primary Care Latrelle Bazar: Romeo [name] Other Clinician: Referring [name] Purdy: Treating Evynn Boutelle/Extender: [name] in Treatment: 19 Active Problems Location of Pain Severity and Description of Pain Patient Has Paino No Site Locations Rate the pain. Current Pain Level: 0 Pain Management and Medication Current Pain Management: DARISSA, SPENNER ([id number]) [phone].pdf Page 6 of 9 Electronic Signature(s) Signed: 08/04/2022 3:40:23 PM By: Gelene Mink By: Samuella Bruin on 08/04/2022 15:18:09 -------------------------------------------------------------------------------- Patient/Caregiver Education Details Patient Name: Date of Service: [name], [name] 7/5/2024andnbsp3:15 PM Medical Record Number: [id number] Patient Account Number: [id number] Date of Birth/Gender: Treating RN: [birth year] (89 y.o. [name] Primary Care Physician: Romeo [name] Other Clinician: Referring Physician: Treating Physician/Extender: [name] in Treatment: 19 Education Assessment Education Provided To: Patient and Caregiver Education Topics Provided Wound/Skin Impairment: Methods: Explain/Verbal Responses: State content correctly Electronic Signature(s) Unsigned Entered By: Brenton Grills on 08/04/2022 15:45:11 -------------------------------------------------------------------------------- Wound Assessment Details Patient Name: Date of Service: [name] 08/04/2022 3:15 PM Medical Record Number: [id number] Patient Account Number: [id number] Date of Birth/Sex: Treating RN: 08/24/1930 (89 y.o. [name] Primary Care Yoan Sallade: Romeo [name] Other Clinician: Referring Hulet Ehrmann: Treating Kealie Barrie/Extender: [name] in Treatment: 19 Wound Status Wound Number: 12 Primary Lymphedema Etiology: Wound Location: Left T Fifth oe Wound Open Wounding Event: Gradually Appeared Status: Date Acquired: 07/10/2022 Comorbid Cataracts, Lymphedema, Sleep Apnea, Coronary Artery Disease, Weeks Of Treatment: 3 History: Hypertension, Hepatitis B, Osteoarthritis Clustered Wound: No Photos [place], [name] ([id number]) [id number] Page 7 of 9 Wound Measurements Length: (cm) 0.1 Width: (cm) 0.1 Depth: (cm) 0.1 Area: (cm) 0.008 Volume: (cm) 0.001 % Reduction in Area: 74.2% % Reduction in Volume: 66.7% Epithelialization: Large (67-100%) Tunneling: No Undermining: No Wound Description Classification: Full Thickness Without Exposed Support Structures Wound Margin: Distinct, outline attached Exudate Amount: None Present Foul Odor After Cleansing: No Slough/Fibrino No Wound Bed Granulation Amount: None Present (0%) Exposed Structure Necrotic Amount: Large (67-100%) Fascia Exposed: No Necrotic Quality: Eschar Fat Layer (Subcutaneous Tissue) Exposed: No Tendon Exposed: No Muscle Exposed: No Joint Exposed: No Bone Exposed:  No Periwound Skin Texture Texture Color No Abnormalities Noted: No No Abnormalities Noted: Yes Callus: Yes Temperature / Pain Temperature: No Abnormality Moisture No Abnormalities Noted: Yes Treatment Notes Wound #12 (Toe Fifth) Wound Laterality: Left Cleanser Soap and Water Discharge Instruction: May shower and wash wound with dial antibacterial soap and water prior to dressing change. Wound Cleanser Discharge Instruction: Cleanse the wound with wound cleanser prior to applying a clean dressing using gauze sponges, not tissue or cotton balls. Peri-Wound Care Topical Primary Dressing Maxorb Extra Ag+ Alginate Dressing, 2x2 (in/in) Discharge Instruction: Apply to wound bed as instructed Optifoam Non-Adhesive Dressing, 4x4 in Discharge Instruction: Apply to wound bed  to pad toe Secondary Dressing Bordered Gauze, 2x2 in Discharge Instruction: Apply over primary dressing as directed. Secured With Compression Wrap Compression Stockings Add-Ons Electronic Signature(s) Zoar, Jaynee ([id number]) [id number] Page 8 of 9 Signed: 08/04/2022 3:40:23 PM By: Gelene Mink By: Samuella Bruin on 08/04/2022 15:26:24 -------------------------------------------------------------------------------- Wound Assessment Details Patient Name: Date of Service: [name], [name] 08/04/2022 3:15 PM Medical Record Number: [id number] Patient Account Number: [id number] Date of Birth/Sex: Treating RN: Jun 03, 1930 (89 y.o. [name] Primary Care Amiah Frohlich: Romeo [name] Other Clinician: Referring Auston Halfmann: Treating Eaven Schwager/Extender: [name] in Treatment: 19 Wound Status Wound Number: 13 Primary Lymphedema Etiology: Wound Location: Right, Lateral Lower Leg Wound Open Wounding Event: Contusion/Bruise Status: Date Acquired: 07/26/2022 Comorbid Cataracts, Lymphedema, Sleep Apnea, Coronary Artery Disease, Weeks Of  Treatment: 1 History: Hypertension, Hepatitis B, Osteoarthritis Clustered Wound: No Photos Wound Measurements Length: (cm) 1.4 Width: (cm) 5.5 Depth: (cm) 0.1 Area: (cm) 6.048 Volume: (cm) 0.605 % Reduction in Area: -180% % Reduction in Volume: -180.1% Epithelialization: Small (1-33%) Tunneling: No Undermining: No Wound Description Classification: Full Thickness Without Exposed Support Structures Wound Margin: Distinct, outline attached Exudate Amount: Medium Exudate Type: Serosanguineous Exudate Color: red, brown Foul Odor After Cleansing: No Slough/Fibrino Yes Wound Bed Granulation Amount: Medium (34-66%) Exposed Structure Granulation Quality: Red Fat Layer (Subcutaneous Tissue) Exposed: Yes Necrotic Amount: Medium (34-66%) Necrotic Quality: Adherent Slough Periwound Skin Texture Texture Color No Abnormalities Noted: No No Abnormalities Noted: No Scarring: Yes Ecchymosis: Yes Moisture Temperature / Pain No Abnormalities Noted: No Temperature: No Abnormality Treatment Notes Wound #13 (Lower Leg) Wound Laterality: Right, Lateral Marvin, Andreika ([id number]) [phone].pdf Page 9 of 9 Cleanser Peri-Wound Care Topical Primary Dressing Maxorb Extra Ag+ Alginate Dressing, 4x4.75 (in/in) Discharge Instruction: Apply to wound bed as instructed Secondary Dressing Secured With Compression Wrap Urgo K2 Lite, (equivalent to a 3 layer) two layer compression system, regular Discharge Instruction: Apply Urgo K2 Lite as directed (alternative to 3 layer compression). Compression Stockings Add-Ons Electronic Signature(s) Signed: 08/04/2022 3:40:23 PM By: Samuella Bruin Entered By: Samuella Bruin on 08/04/2022 15:26:43 -------------------------------------------------------------------------------- Vitals Details Patient Name: Date of Service: [name], [name] 08/04/2022 3:15 PM Medical Record Number: [id number] Patient Account Number:  [id number] Date of Birth/Sex: Treating RN: 1930/06/18 (89 y.o. [name] Primary Care Prentis Langdon: Romeo [name] Other Clinician: Referring Jaelyn Bourgoin: Treating Datrell Dunton/Extender: [name] in Treatment: 19 Vital Signs Time Taken: 15:18 Temperature (F): 98 Height (in): 68 Pulse (bpm): 66 Weight (lbs): 180 Respiratory Rate (breaths/min): 18 Body Mass Index (BMI): 27.4 Blood Pressure (mmHg): 149/75 Reference Range: 80 - 120 mg / dl Electronic Signature(s) Signed: 08/04/2022 3:40:23 PM By: Samuella Bruin Entered By: Samuella Bruin on 08/04/2022 15:19:10

## 2022-08-11 ENCOUNTER — Encounter (HOSPITAL_BASED_OUTPATIENT_CLINIC_OR_DEPARTMENT_OTHER): Payer: Medicare Other | Admitting: General Surgery

## 2022-08-11 DIAGNOSIS — L97522 Non-pressure chronic ulcer of other part of left foot with fat layer exposed: Secondary | ICD-10-CM | POA: Diagnosis not present

## 2022-08-11 NOTE — Progress Notes (Addendum)
NOVA, SCHMUHL ([id number]) [id number] Page 1 of 10 Visit Report for 08/11/2022 Chief Complaint Document Details Patient Name: Date of Service: [name], [place] RO L 08/11/2022 10:45 A M Medical Record Number: [id number] Patient Account Number: [id number] Date of Birth/Sex: Treating RN: [birth year] (89 y.o. F) Primary Care Provider: Romeo Rabon Other Clinician: Referring Provider: Treating Provider/Extender: [name] in Treatment: 20 Information Obtained from: Patient Chief Complaint Patient presents for treatment of open ulcers due to venous insufficiency Electronic Signature(s) Signed: 08/11/2022 11:40:48 AM By: Duanne Guess MD FACS Entered By: Duanne Guess on 08/11/2022 11:40:48 -------------------------------------------------------------------------------- Debridement Details Patient Name: Date of Service: [name], [name] 08/11/2022 10:45 A M Medical Record Number: [id number] Patient Account Number: [id number] Date of Birth/Sex: Treating RN: 1930/06/19 (89 y.o. F) Primary Care Provider: Romeo Rabon Other Clinician: Referring Provider: Treating Provider/Extender: [name] in Treatment: 20 Debridement Performed for Assessment: Wound #12 Left T Fifth oe Performed By: Physician Duanne Guess, MD Debridement Type: Debridement Level of Consciousness (Pre-procedure): Awake and Alert Pre-procedure Verification/Time Out Yes - 11:26 Taken: Start Time: 11:28 Percent of Wound Bed Debrided: 150% T Area Debrided (cm): otal 0.11 Tissue and other material debrided: Non-Viable, Callus, Slough, Slough Level: Non-Viable Tissue Debridement Description: Selective/Open Wound Instrument: Curette Bleeding: Minimum Hemostasis Achieved: Pressure End Time: 11:35 Procedural Pain: 0 Post Procedural Pain: 0 Response to Treatment: Procedure was tolerated well Level of Consciousness (Post- Awake  and Alert procedure): Post Debridement Measurements of Total Wound Length: (cm) 0.3 Width: (cm) 0.3 Depth: (cm) 0.1 Volume: (cm) 0.007 Character of Wound/Ulcer Post Debridement: Improved Post Procedure Diagnosis [place], [name] ([id number]) [id number] Page 2 of 10 Same as Pre-procedure Electronic Signature(s) Signed: 08/11/2022 11:38:57 AM By: Duanne Guess MD FACS Entered By: Duanne Guess on 08/11/2022 11:38:55 -------------------------------------------------------------------------------- Debridement Details Patient Name: Date of Service: [name], [name] 08/11/2022 10:45 A M Medical Record Number: [id number] Patient Account Number: [id number] Date of Birth/Sex: Treating RN: 28-Jan-1931 (89 y.o. F) Primary Care Provider: Romeo Rabon Other Clinician: Referring Provider: Treating Provider/Extender: [name] in Treatment: 20 Debridement Performed for Assessment: Wound #13 Right,Lateral Lower Leg Performed By: Physician Duanne Guess, MD Debridement Type: Debridement Level of Consciousness (Pre-procedure): Awake and Alert Pre-procedure Verification/Time Out Yes - 11:26 Taken: Start Time: 11:28 Percent of Wound Bed Debrided: 100% T Area Debrided (cm): otal 4.24 Tissue and other material debrided: Non-Viable, Slough, Slough Level: Non-Viable Tissue Debridement Description: Selective/Open Wound Instrument: Curette Bleeding: Minimum Hemostasis Achieved: Pressure End Time: 11:35 Procedural Pain: 0 Post Procedural Pain: 0 Response to Treatment: Procedure was tolerated well Level of Consciousness (Post- Awake and Alert procedure): Post Debridement Measurements of Total Wound Length: (cm) 1 Width: (cm) 5.4 Depth: (cm) 0.1 Volume: (cm) 0.424 Character of Wound/Ulcer Post Debridement: Improved Post Procedure Diagnosis Same as Pre-procedure Notes scribed for Dr. Lady Gary by Brenton Grills RN Electronic  Signature(s) Signed: 08/11/2022 11:39:17 AM By: Duanne Guess MD FACS Entered By: Duanne Guess on 08/11/2022 11:39:17 -------------------------------------------------------------------------------- HPI Details Patient Name: Date of Service: [name], [name] 08/11/2022 10:45 A M Medical Record Number: [id number] Patient Account Number: [id number] Date of Birth/Sex: Treating RN: March 03, 1930 (89 y.o. Vaniah Chambers, Marilena ([id number]) [id number] Page 3 of 10 Primary Care Provider: Romeo Rabon Other Clinician: Referring Provider: Treating Provider/Extender: [name] in Treatment: 20 History of Present Illness HPI Description: ADMISSION 03/20/2022 This is an 89 year old woman with coronary artery disease, on chronic corticosteroids for osteoarthritis, who presents with bilateral lower  leg ulcers. They have been present for a couple of weeks. I do not have any vascular studies available to discern whether or not she has had venous reflux or arterial studies done. ABIs in clinic were 1.34 on the left and 0.95 on the right. She is not diabetic. She does not smoke. Her legs are quite edematous and she has multiple hematomas of various sizes and ages extending up to at least the distal thigh. She has 3 ulcers on the left leg with slough and eschar accumulation. On the right leg, she has a tiny superficial ulcer on the anterior tibial surface. She also has a small ulcer on the lateral aspect of her right third toe. 03/27/2022: The ulcer on her right anterior tibial surface and lateral third toe are both healed. The ulcers on her left leg are smaller but have slough accumulation. Edema control is good. Her juxta lite stockings arrived in the mail, but she did not bring them with her today. 04/06/2022: She has a new skin tear just distal to her left knee. The ulcer on her left upper posterior calf is nearly healed under a layer of eschar. She  has a spot on her right second toe that she is concerned about, saying that she frequently gets an ulcer there, but I do not see any open wound in this site. 04/21/2022: All of her wounds are healed except for the skin tear distal to her left knee. There is a layer of eschar on the surface. Underneath, the wound is quite superficial and clean. 04/19/2022: All of her wounds are healed. She is wearing a juxta lite stocking on her right leg and has brought her other stocking for [place] to apply to her left. 05/22/2022: She returns today with new ulcers on her left leg. A large hematoma/blood blister on her upper lateral leg, just distal to the knee opened up shortly after she was healed out from our clinic. There is slough on the wound surface, but no concern for infection. She has a second ulcer on her anterior tibial surface with slough on the surface. The small site on her right second toe that we have been monitoring has opened. There is some slough in the center with surrounding callus. She has a hematoma/blood blister on the medial aspect of her right lower leg that is not open, but it is certainly threatening to do so. 05/29/2022: The hematoma that had threatened to open last week is now open. There is slough and hematoma and nonviable subcutaneous tissue present. The toe ulcer is down to a small pinhole, but there is some undermining from some dry skin present. The ulcers on her left leg are both smaller. There is some slough accumulation on both. 06/05/2022: The more proximal of the left leg ulcers is closed by about 50%. There is some eschar on the wound surface. The anterior left leg ulcer is nearly healed with just a little eschar on the remaining open portion. The lateral foot ulcer has some eschar and slough present the most recent right lower leg wound has slough and fat exposed. The right second toe ulcer looks about the same with a layer of callus and slough. 06/13/2022: The anterior left leg ulcer  is closed. The proximal left lower leg ulcer is down to just about a centimeter with some slough and eschar on the surface. The right foot ulcer is healed. The right anterior lower leg wound continues to accumulate slough but is a little bit smaller today.  The right second toe ulcer is smaller underneath the layer of callus and slough. 06/19/2022: The proximal left lower leg ulcer is down to just a couple of millimeters. It is clean. The right anterior lower leg wound is smaller again today, but still with some slough buildup. The right second toe ulcer is down to just a pinpoint opening underneath some callus. 07/03/2022: The left lower leg ulcer is closed. The right anterior lower leg wound is smaller again this week. There is a little bit of light slough accumulation. The right second toe ulcer is unchanged underneath its layer of callus. She is also complaining of pain related to a callus in her midfoot as well as one on her fifth toe. 07/10/2022: The right anterior lower leg wound is nearly closed underneath the layer of eschar. The right second toe ulcer appears to have healed. The fifth toe callus on her left foot has reaccumulated and she says it is causing her pain. After debridement, I discovered an ulcer at the base of this callus. 07/17/2022: The right anterior lower leg wound is closed. The right second toe ulcer remains closed. The fifth toe ulcer on her left foot has reaccumulated some callus. The small wound remains open with some slough buildup. 07/28/2022: She has a new skin tear on her right lateral lower leg. She says it happened while she was pulling up her stockings, but denies snagging it with her jewelry or a fingernail. The skin flap seems to be at least partially adhered down in the exposed area is clean, but it does extend to the fat layer. The wound on her lateral fifth toe on the left is nearly closed under a layer of callus. The right second toe wound has not  reopened. 08/04/2022: Much of the skin at the site of her skin tear has survived. There is still a band of tissue that appears compromised and may not ultimately survive. There is slough on the wound surface. The left fifth toe has a tiny pinhole opening that remains under some callus and slough. There is an area on her right fifth toe, mirroring the left, that is somewhat discolored but not yet open. 08/11/2022: The left fifth toe site has healed. The right fifth toe site, however, looks more suspicious today and after the callus was removed, a small opening was appreciated. The right lower leg wound is smaller. There is slough on the surface. Electronic Signature(s) Signed: 08/11/2022 11:42:04 AM By: Duanne Guess MD FACS Entered By: Duanne Guess on 08/11/2022 11:42:04 -------------------------------------------------------------------------------- Physical Exam Details Patient Name: Date of Service: [name], [name] 08/11/2022 10:45 A M Medical Record Number: [id number] Patient Account Number: [id number] JENISE, IANNELLI ([id number]) [id number] Page 4 of 10 Date of Birth/Sex: Treating RN: 04-Mar-1930 (89 y.o. F) Primary Care Provider: Other Clinician: Romeo Rabon Referring Provider: Treating Provider/Extender: [name] in Treatment: 20 Constitutional . Slightly bradycardic. . . no acute distress. Respiratory Normal work of breathing on room air. Notes 08/11/2022: The left fifth toe site has healed. The right fifth toe site, however, looks more suspicious today and after the callus was removed, a small opening was appreciated. The right lower leg wound is smaller. There is slough on the surface. Electronic Signature(s) Signed: 08/11/2022 11:42:36 AM By: Duanne Guess MD FACS Entered By: Duanne Guess on 08/11/2022 11:42:36 -------------------------------------------------------------------------------- Physician  Orders Details Patient Name: Date of Service: [name], [name] 08/11/2022 10:45 A M Medical Record Number: [id number] Patient Account Number: [id number]  Date of Birth/Sex: Treating RN: 04/16/1930 (89 y.o. Gevena Mart Primary Care Provider: Romeo Rabon Other Clinician: Referring Provider: Treating Provider/Extender: [name] in Treatment: 20 Verbal / Phone Orders: No Diagnosis Coding ICD-10 Coding Code Description [phone] 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ed L97.512 Non-pressure chronic ulcer of other part of right foot with fat layer exposed R60.0 Localized edema I87.2 Venous insufficiency (chronic) (peripheral) Z79.52 Long term (current) use of systemic steroids L84 Corns and callosities Follow-up Appointments ppointment in 2 weeks. - Dr. Lady Gary - Room 3 Return A Bathing/ Shower/ Hygiene May shower with protection but do not get wound dressing(s) wet. Protect dressing(s) with water repellant cover (for example, large plastic bag) or a cast cover and may then take shower. Edema Control - Lymphedema / SCD / Other Elevate legs to the level of the heart or above for 30 minutes daily and/or when sitting for 3-4 times a day throughout the day. Avoid standing for long periods of time. Exercise regularly Moisturize legs daily. - left leg daily Compression stocking or Garment 20-30 mm/Hg pressure to: - juxtalite to left leg daily Wound Treatment Wound #12 - T Fifth oe Wound Laterality: Left Prim Dressing: Maxorb Extra Ag+ Alginate Dressing, 4x4.75 (in/in) ary Discharge Instructions: Apply to wound bed as instructed Compression Wrap: Urgo K2 Lite, (equivalent to a 3 layer) two layer compression system, regular Discharge Instructions: Apply Urgo K2 Lite as directed (alternative to 3 layer compression). Wound #13 - Lower Leg Wound Laterality: Right, Lateral Prim Dressing: Maxorb Extra Ag+ Alginate  Dressing, 4x4.75 (in/in) ary Discharge Instructions: Apply to wound bed as instructed JABREE, PERNICE ([id number]) [id number] Page 5 of 10 Compression Wrap: Urgo K2 Lite, (equivalent to a 3 layer) two layer compression system, regular Discharge Instructions: Apply Urgo K2 Lite as directed (alternative to 3 layer compression). Electronic Signature(s) Signed: 08/11/2022 11:57:43 AM By: Duanne Guess MD FACS Entered By: Duanne Guess on 08/11/2022 11:43:56 -------------------------------------------------------------------------------- Problem List Details Patient Name: Date of Service: [name], [name] 08/11/2022 10:45 A M Medical Record Number: [id number] Patient Account Number: [id number] Date of Birth/Sex: Treating RN: [birth year] (89 y.o. Gevena Mart Primary Care Provider: Romeo Rabon Other Clinician: Referring Provider: Treating Provider/Extender: [name] in Treatment: 20 Active Problems ICD-10 Encounter Code Description Active Date MDM Diagnosis [phone] Non-pressure chronic ulcer of other part of right lower leg with fat layer 07/28/2022 No Yes exposed L97.512 Non-pressure chronic ulcer of other part of right foot with fat layer exposed 08/11/2022 No Yes R60.0 Localized edema 03/20/2022 No Yes I87.2 Venous insufficiency (chronic) (peripheral) 03/20/2022 No Yes Z79.52 Long term (current) use of systemic steroids 03/20/2022 No Yes L84 Corns and callosities 06/05/2022 No Yes Inactive Problems Resolved Problems ICD-10 Code Description Active Date Resolved Date L97.811 Non-pressure chronic ulcer of other part of right lower leg limited to breakdown of skin 03/20/2022 03/20/2022 V78.469 Non-pressure chronic ulcer of other part of left lower leg with fat layer exposed 05/22/2022 03/20/2022 L97.511 Non-pressure chronic ulcer of other part of right foot limited to breakdown of skin 03/20/2022 03/20/2022 L97.812 Non-pressure  chronic ulcer of other part of right lower leg with fat layer exposed 05/29/2022 05/29/2022 Valaria Good ([id number]) [id number] Page 6 of 10 [phone] Non-pressure chronic ulcer of other part of left foot with fat layer exposed 07/10/2022 07/10/2022 Electronic Signature(s) Signed: 08/11/2022 11:29:38 AM By: Duanne Guess MD FACS Entered By: Duanne Guess on 08/11/2022 11:29:37 -------------------------------------------------------------------------------- Progress Note Details  Patient Name: Date of Service: [name], [place] RO L 08/11/2022 10:45 A M Medical Record Number: [id number] Patient Account Number: [id number] Date of Birth/Sex: Treating RN: Aug 10, 1930 (89 y.o. F) Primary Care Provider: Romeo Rabon Other Clinician: Referring Provider: Treating Provider/Extender: [name] in Treatment: 20 Subjective Chief Complaint Information obtained from Patient Patient presents for treatment of open ulcers due to venous insufficiency History of Present Illness (HPI) ADMISSION 03/20/2022 This is an 89 year old woman with coronary artery disease, on chronic corticosteroids for osteoarthritis, who presents with bilateral lower leg ulcers. They have been present for a couple of weeks. I do not have any vascular studies available to discern whether or not she has had venous reflux or arterial studies done. ABIs in clinic were 1.34 on the left and 0.95 on the right. She is not diabetic. She does not smoke. Her legs are quite edematous and she has multiple hematomas of various sizes and ages extending up to at least the distal thigh. She has 3 ulcers on the left leg with slough and eschar accumulation. On the right leg, she has a tiny superficial ulcer on the anterior tibial surface. She also has a small ulcer on the lateral aspect of her right third toe. 03/27/2022: The ulcer on her right anterior tibial surface and lateral third toe are both  healed. The ulcers on her left leg are smaller but have slough accumulation. Edema control is good. Her juxta lite stockings arrived in the mail, but she did not bring them with her today. 04/06/2022: She has a new skin tear just distal to her left knee. The ulcer on her left upper posterior calf is nearly healed under a layer of eschar. She has a spot on her right second toe that she is concerned about, saying that she frequently gets an ulcer there, but I do not see any open wound in this site. 04/21/2022: All of her wounds are healed except for the skin tear distal to her left knee. There is a layer of eschar on the surface. Underneath, the wound is quite superficial and clean. 04/19/2022: All of her wounds are healed. She is wearing a juxta lite stocking on her right leg and has brought her other stocking for [place] to apply to her left. 05/22/2022: She returns today with new ulcers on her left leg. A large hematoma/blood blister on her upper lateral leg, just distal to the knee opened up shortly after she was healed out from our clinic. There is slough on the wound surface, but no concern for infection. She has a second ulcer on her anterior tibial surface with slough on the surface. The small site on her right second toe that we have been monitoring has opened. There is some slough in the center with surrounding callus. She has a hematoma/blood blister on the medial aspect of her right lower leg that is not open, but it is certainly threatening to do so. 05/29/2022: The hematoma that had threatened to open last week is now open. There is slough and hematoma and nonviable subcutaneous tissue present. The toe ulcer is down to a small pinhole, but there is some undermining from some dry skin present. The ulcers on her left leg are both smaller. There is some slough accumulation on both. 06/05/2022: The more proximal of the left leg ulcers is closed by about 50%. There is some eschar on the wound surface. The  anterior left leg ulcer is nearly healed with just a little eschar on  the remaining open portion. The lateral foot ulcer has some eschar and slough present the most recent right lower leg wound has slough and fat exposed. The right second toe ulcer looks about the same with a layer of callus and slough. 06/13/2022: The anterior left leg ulcer is closed. The proximal left lower leg ulcer is down to just about a centimeter with some slough and eschar on the surface. The right foot ulcer is healed. The right anterior lower leg wound continues to accumulate slough but is a little bit smaller today. The right second toe ulcer is smaller underneath the layer of callus and slough. 06/19/2022: The proximal left lower leg ulcer is down to just a couple of millimeters. It is clean. The right anterior lower leg wound is smaller again today, but still with some slough buildup. The right second toe ulcer is down to just a pinpoint opening underneath some callus. 07/03/2022: The left lower leg ulcer is closed. The right anterior lower leg wound is smaller again this week. There is a little bit of light slough accumulation. The right second toe ulcer is unchanged underneath its layer of callus. She is also complaining of pain related to a callus in her midfoot as well as one on her fifth toe. 07/10/2022: The right anterior lower leg wound is nearly closed underneath the layer of eschar. The right second toe ulcer appears to have healed. The fifth toe callus on her left foot has reaccumulated and she says it is causing her pain. After debridement, I discovered an ulcer at the base of this callus. 07/17/2022: The right anterior lower leg wound is closed. The right second toe ulcer remains closed. The fifth toe ulcer on her left foot has reaccumulated some [place], Danaisha ([id number]) [id number] Page 7 of 10 callus. The small wound remains open with some slough buildup. 07/28/2022: She has a new  skin tear on her right lateral lower leg. She says it happened while she was pulling up her stockings, but denies snagging it with her jewelry or a fingernail. The skin flap seems to be at least partially adhered down in the exposed area is clean, but it does extend to the fat layer. The wound on her lateral fifth toe on the left is nearly closed under a layer of callus. The right second toe wound has not reopened. 08/04/2022: Much of the skin at the site of her skin tear has survived. There is still a band of tissue that appears compromised and may not ultimately survive. There is slough on the wound surface. The left fifth toe has a tiny pinhole opening that remains under some callus and slough. There is an area on her right fifth toe, mirroring the left, that is somewhat discolored but not yet open. 08/11/2022: The left fifth toe site has healed. The right fifth toe site, however, looks more suspicious today and after the callus was removed, a small opening was appreciated. The right lower leg wound is smaller. There is slough on the surface. Patient History Information obtained from Patient. Family History Heart Disease - Father, Hypertension - Mother, No family history of Cancer, Diabetes, Hereditary Spherocytosis, Kidney Disease, Lung Disease, Seizures, Stroke, Thyroid Problems, Tuberculosis. Social History Never smoker, Marital Status - Married, Alcohol Use - Rarely, Drug Use - No History, Caffeine Use - Moderate. Medical History Eyes Patient has history of Cataracts Hematologic/Lymphatic Patient has history of Lymphedema Respiratory Patient has history of Sleep Apnea Cardiovascular Patient has history of Coronary Artery Disease, Hypertension  Gastrointestinal Patient has history of Hepatitis B - 50+ yrs ago Musculoskeletal Patient has history of Osteoarthritis Hospitalization/Surgery History - abdominal hysterectomy. - cardiac catheterization. - cataract extraction bilat.. -  colonoscopy. - bilateral total knee replacement. - upper GI endoscopy. Medical A Surgical History Notes nd Gastrointestinal diverticulosis, GERD Musculoskeletal osteoporosis Objective Constitutional Slightly bradycardic. no acute distress. Vitals Time Taken: 11:05 AM, Height: 68 in, Weight: 180 lbs, BMI: 27.4, Temperature: 97.5 F, Pulse: 58 bpm, Respiratory Rate: 18 breaths/min, Blood Pressure: 116/77 mmHg. Respiratory Normal work of breathing on room air. General Notes: 08/11/2022: The left fifth toe site has healed. The right fifth toe site, however, looks more suspicious today and after the callus was removed, a small opening was appreciated. The right lower leg wound is smaller. There is slough on the surface. Integumentary (Hair, Skin) Wound #12 status is Open. Original cause of wound was Gradually Appeared. The date acquired was: 07/10/2022. The wound has been in treatment 4 weeks. The wound is located on the Left T Fifth. The wound measures 0.3cm length x 0.3cm width x 0.1cm depth; 0.071cm^2 area and 0.007cm^3 volume. There is oe no tunneling or undermining noted. There is a none present amount of drainage noted. The wound margin is distinct with the outline attached to the wound base. There is no granulation within the wound bed. There is a large (67-100%) amount of necrotic tissue within the wound bed including Eschar. The periwound skin appearance had no abnormalities noted for moisture. The periwound skin appearance had no abnormalities noted for color. The periwound skin appearance exhibited: Callus. Periwound temperature was noted as No Abnormality. Wound #13 status is Open. Original cause of wound was Contusion/Bruise. The date acquired was: 07/26/2022. The wound has been in treatment 2 weeks. The wound is located on the Right,Lateral Lower Leg. The wound measures 1cm length x 5.4cm width x 0.1cm depth; 4.241cm^2 area and 0.424cm^3 volume. There is Fat Layer (Subcutaneous  Tissue) exposed. There is no tunneling or undermining noted. There is a medium amount of serosanguineous drainage noted. The wound margin is distinct with the outline attached to the wound base. There is medium (34-66%) red granulation within the wound bed. There is a medium (34- 66%) amount of necrotic tissue within the wound bed including Adherent Slough. The periwound skin appearance exhibited: Scarring, Ecchymosis. Periwound temperature was noted as No Abnormality. 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 There was a Selective/Open Wound Non-Viable Tissue Debridement oe with a total area of 0.11 sq cm performed by Duanne Guess, MD. With the following instrument(s): Curette to remove Non-Viable tissue/material. Material removed includes Callus and Slough and. No specimens were taken. A time out was conducted at 11:26, prior to the start of the procedure. A Minimum amount of bleeding was controlled with Pressure. The procedure was tolerated well with a pain level of 0 throughout and a pain level of 0 following the procedure. Post Debridement Measurements: 0.3cm length x 0.3cm width x 0.1cm depth; 0.007cm^3 volume. Character of Wound/Ulcer Post Debridement is improved. Post procedure Diagnosis Wound #12: Same as Pre-Procedure Wound #13 Pre-procedure diagnosis of Wound #13 is a  Lymphedema located on the Right,Lateral Lower Leg . There was a Selective/Open Wound Non-Viable Tissue Debridement with a total area of 4.24 sq cm  performed by Duanne Guess, MD. With the following instrument(s): Curette to remove Non-Viable tissue/material. Material removed includes [facility]. No specimens were taken. A time out was conducted at 11:26, prior to the start of the procedure. A Minimum amount of bleeding was controlled with Pressure. The procedure was tolerated well with a pain level of 0 throughout and a pain level of 0 following the procedure. Post Debridement Measurements: 1cm length x 5.4cm width x 0.1cm depth; 0.424cm^3 volume. Character of Wound/Ulcer Post Debridement is improved. Post procedure Diagnosis Wound #13: Same as Pre-Procedure General Notes: scribed for Dr. Lady Gary by Brenton Grills RN. Pre-procedure diagnosis of Wound #13 is a Lymphedema located on the Right,Lateral Lower Leg . There was a Three Layer Compression Therapy Procedure by Brenton Grills, RN. Post procedure Diagnosis Wound #13: Same as Pre-Procedure Plan Follow-up Appointments: Return Appointment in 2 weeks. - Dr. Lady Gary - Room 3 Bathing/ Shower/ Hygiene: May shower with protection but do not get wound dressing(s) wet. Protect dressing(s) with water repellant cover (for example, large plastic bag) or a cast cover and may then take shower. Edema Control - Lymphedema / SCD / Other: Elevate legs to the level of the heart or above for 30 minutes daily and/or when sitting for 3-4 times a day throughout the day. Avoid standing for long periods of time. Exercise regularly Moisturize legs daily. - left leg daily Compression stocking or Garment 20-30 mm/Hg pressure to: - juxtalite to left leg daily WOUND #12: - T Fifth Wound Laterality: Left oe Prim Dressing: Maxorb Extra Ag+ Alginate Dressing, 4x4.75 (in/in) ary Discharge Instructions: Apply to wound bed as instructed Com pression Wrap: Urgo K2 Lite, (equivalent to a 3 layer) two layer compression system, regular Discharge Instructions: Apply Urgo K2 Lite as directed (alternative to 3 layer  compression). WOUND #13: - Lower Leg Wound Laterality: Right, Lateral Prim Dressing: Maxorb Extra Ag+ Alginate Dressing, 4x4.75 (in/in) ary Discharge Instructions: Apply to wound bed as instructed Com pression Wrap: Urgo K2 Lite, (equivalent to a 3 layer) two layer compression system, regular Discharge Instructions: Apply Urgo K2 Lite as directed (alternative to 3 layer compression). 08/11/2022: The left fifth toe site has healed. The right fifth toe site, however, looks more suspicious today and after the callus was removed, a small opening was appreciated. The right lower leg wound is smaller. There is slough on the surface. I used a curette to debride callus off of the right fifth toe. I debrided slough off of the right lower leg wound. We will continue silver alginate to both sites with Urgo lite compression to the leg. Follow-up in 1 week. Electronic Signature(s) Signed: 08/15/2022 6:16:33 PM By: Shawn Stall RN, BSN Signed: 08/16/2022 12:38:39 PM By: Duanne Guess MD FACS Previous Signature: 08/11/2022 11:44:53 AM Version By: Duanne Guess MD FACS [place], [name] ([id number]) [id number] Page 9 of 10 Previous Signature: 08/11/2022 11:44:53 AM Version By: Duanne Guess MD FACS Entered By: Shawn Stall on 08/15/2022 18:15:53 -------------------------------------------------------------------------------- HxROS Details Patient Name: Date of Service: [name], [name] 08/11/2022 10:45 A M Medical Record Number: [id number] Patient Account Number: [id number] Date of Birth/Sex: Treating RN: [birth year] (89 y.o. F) Primary Care Provider: Romeo Rabon Other Clinician: Referring Provider: Treating Provider/Extender: [name] in Treatment: 20 Information Obtained From Patient Eyes Medical History: Positive for: Cataracts Hematologic/Lymphatic Medical History: Positive for: Lymphedema Respiratory Medical  History: Positive  for: Sleep Apnea Cardiovascular Medical History: Positive for: Coronary Artery Disease; Hypertension Gastrointestinal Medical History: Positive for: Hepatitis B - 50+ yrs ago Past Medical History Notes: diverticulosis, GERD Musculoskeletal Medical History: Positive for: Osteoarthritis Past Medical History Notes: osteoporosis HBO Extended History Items Eyes: Cataracts Immunizations Pneumococcal Vaccine: Received Pneumococcal Vaccination: Yes Received Pneumococcal Vaccination On or After 60th Birthday: Yes Implantable Devices No devices added Hospitalization / Surgery History Type of Hospitalization/Surgery abdominal hysterectomy cardiac catheterization cataract extraction bilat. colonoscopy bilateral total knee replacement [place], [place] ([id number]) [id number] Page 10 of 10 upper GI endoscopy Family and Social History Cancer: No; Diabetes: No; Heart Disease: Yes - Father; Hereditary Spherocytosis: No; Hypertension: Yes - Mother; Kidney Disease: No; Lung Disease: No; Seizures: No; Stroke: No; Thyroid Problems: No; Tuberculosis: No; Never smoker; Marital Status - Married; Alcohol Use: Rarely; Drug Use: No History; Caffeine Use: Moderate; Financial Concerns: No; Food, Clothing or Shelter Needs: No; Support System Lacking: No; Transportation Concerns: No Electronic Signature(s) Signed: 08/11/2022 11:57:43 AM By: Duanne Guess MD FACS Entered By: Duanne Guess on 08/11/2022 11:42:11 -------------------------------------------------------------------------------- SuperBill Details Patient Name: Date of Service: [name], [name] 08/11/2022 Medical Record Number: [id number] Patient Account Number: [id number] Date of Birth/Sex: Treating RN: March 22, 1930 (89 y.o. Gevena Mart Primary Care Provider: Romeo Rabon Other Clinician: Referring Provider: Treating Provider/Extender: [name] in Treatment: 20 Diagnosis  Coding ICD-10 Codes Code Description [phone] 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ed L97.512 Non-pressure chronic ulcer of other part of right foot with fat layer exposed R60.0 Localized edema I87.2 Venous insufficiency (chronic) (peripheral) Z79.52 Long term (current) use of systemic steroids L84 Corns and callosities Facility Procedures : CPT4 Code: [id number] Description: 97597 - DEBRIDE WOUND 1ST 20 SQ CM OR < ICD-10 Diagnosis Description L97.812 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 L97.512 Non-pressure chronic ulcer of other part of right foot with fat layer  exposed Modifier: ed Quantity: 1 Physician Procedures : CPT4 Code Description Modifier [id number] - WC PHYS LEVEL 3 - EST PT 25 ICD-10 Diagnosis Description L97.812 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ed L97.512 Non-pressure chronic ulcer of other part of right  foot with fat layer exposed I87.2 Venous insufficiency (chronic) (peripheral) Z79.52 Long term (current) use of systemic steroids Quantity: 1 : [id number] 97597 - WC PHYS DEBR WO ANESTH 20 SQ CM ICD-10 Diagnosis Description L97.812 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ed L97.512 Non-pressure chronic ulcer of other part of right foot with fat layer  exposed Quantity: 1 Electronic Signature(s) Signed: 08/11/2022 11:45:42 AM By: Duanne Guess MD FACS Entered By: Duanne Guess on 08/11/2022 11:45:42

## 2022-08-16 NOTE — Progress Notes (Signed)
[name], [name] ([id number]) [id number] Page 1 of 9 Visit Report for 08/11/2022 Arrival Information Details Patient Name: Date of Service: [name], [place] RO L 08/11/2022 10:45 A M Medical Record Number: [id number] Patient Account Number: [id number] Date of Birth/Sex: Treating RN: 1930/08/14 (89 y.o. [name] Primary Care Nakhia Levitan: Romeo Rabon Other Clinician: Referring Anastacia Reinecke: Treating Morrell Fluke/Extender: [name] in Treatment: 20 Visit Information History Since Last Visit All ordered tests and consults were completed: Yes Patient Arrived: [name] Added or deleted any medications: No Arrival Time: 11:06 Any new allergies or adverse reactions: No Accompanied By: husband Had a fall or experienced change in No Transfer Assistance: None activities of daily living that may affect Patient Identification Verified: Yes risk of falls: Secondary Verification Process Completed: Yes Signs or symptoms of abuse/neglect since last visito No Patient Requires Transmission-Based Precautions: No Hospitalized since last visit: No Patient Has Alerts: No Implantable device outside of the clinic excluding No cellular tissue based products placed in the center since last visit: Has Dressing in Place as Prescribed: Yes Has Compression in Place as Prescribed: Yes Pain Present Now: No Electronic Signature(s) Signed: 08/16/2022 7:59:22 AM By: Brenton Grills Entered By: Brenton Grills on 08/11/2022 11:07:23 -------------------------------------------------------------------------------- Compression Therapy Details Patient Name: Date of Service: [name], [name] 08/11/2022 10:45 A M Medical Record Number: [id number] Patient Account Number: [id number] Date of Birth/Sex: Treating RN: [birth year] (89 y.o. [name] Primary Care Dorothe Elmore: Romeo Rabon Other Clinician: Referring Ivori Storr: Treating Benjiman Sedgwick/Extender: [name] in Treatment: 20 Compression Therapy Performed for Wound Assessment: Wound #13 Right,Lateral Lower Leg Performed By: Clinician Brenton Grills, RN Compression Type: Three Layer Post Procedure Diagnosis Same as Pre-procedure Electronic Signature(s) Signed: 08/16/2022 7:59:22 AM By: Brenton Grills Entered By: Brenton Grills on 08/11/2022 11:48:06 [name] ([id number]) [id number] Page 2 of 9 -------------------------------------------------------------------------------- Encounter Discharge Information Details Patient Name: Date of Service: [name], [place] RO L 08/11/2022 10:45 A M Medical Record Number: [id number] Patient Account Number: [id number] Date of Birth/Sex: Treating RN: [birth year] (89 y.o. [name] Primary Care Gargi Berch: Romeo Rabon Other Clinician: Referring Joaquin Knebel: Treating Braedon Sjogren/Extender: [name] in Treatment: 20 Encounter Discharge Information Items Post Procedure Vitals Discharge Condition: Stable Temperature (F): 98 Ambulatory Status: Walker Pulse (bpm): 58 Discharge Destination: Home Respiratory Rate (breaths/min): 18 Transportation: Private Auto Blood Pressure (mmHg): 117/64 Accompanied By: spouse Schedule Follow-up Appointment: Yes Clinical Summary of Care: Patient Declined Electronic Signature(s) Signed: 08/16/2022 7:59:22 AM By: Brenton Grills Entered By: Brenton Grills on 08/11/2022 11:54:08 -------------------------------------------------------------------------------- Lower Extremity Assessment Details Patient Name: Date of Service: [name], [name] 08/11/2022 10:45 A M Medical Record Number: [id number] Patient Account Number: [id number] Date of Birth/Sex: Treating RN: September 28, 1930 (89 y.o. [name] Primary Care Derrill Bagnell: Romeo Rabon Other Clinician: Referring Celest Reitz: Treating Jacey Pelc/Extender: [name] in Treatment: 20 Edema Assessment Assessed: Kyra Searles: No] Franne Forts: No] Edema: [Left: Ye] [Right: s] Calf Left: Right: Point of Measurement: From Medial Instep 32.5 cm Ankle Left: Right: Point of Measurement: From Medial Instep 20 cm Vascular Assessment Pulses: Dorsalis Pedis Palpable: [Right:Yes] Extremity colors, hair growth, and conditions: Hair Growth on Extremity: [Right:No] Temperature of Extremity: [Right:Warm] Capillary Refill: [Right:< 3 seconds] Dependent Rubor: [Right:No] Blanched when Elevated: [Right:No No] Toe Nail Assessment Left: RightEZZIE, SENAT ([id number]) [id number] Page 3 of 9 Thick: Yes Discolored: No Deformed: No Improper Length and Hygiene: No Electronic Signature(s) Signed: 08/16/2022 7:59:22 AM By: Brenton Grills Entered By: Brenton Grills  on 08/11/2022 11:10:25 -------------------------------------------------------------------------------- Multi Wound Chart Details Patient Name: Date of Service: [name], [place] RO L 08/11/2022 10:45 A M Medical Record Number: [id number] Patient Account Number: [id number] Date of Birth/Sex: Treating RN: [birth year] (89 y.o. F) Primary Care Chan Sheahan: Romeo Rabon Other Clinician: Referring Daelon Dunivan: Treating Pascal Stiggers/Extender: [name] in Treatment: 20 Vital Signs Height(in): 68 Pulse(bpm): 58 Weight(lbs): 180 Blood Pressure(mmHg): 116/77 Body Mass Index(BMI): 27.4 Temperature(F): 97.5 Respiratory Rate(breaths/min): 18 [12:Photos: No Photos] [N/A:N/A] Left T Fifth oe Right, Lateral Lower Leg N/A Wound Location: Gradually Appeared Contusion/Bruise N/A Wounding Event: Lymphedema Lymphedema N/A Primary Etiology: Cataracts, Lymphedema, Sleep Cataracts, Lymphedema, Sleep N/A Comorbid History: Apnea, Coronary Artery Disease, Apnea, Coronary Artery Disease, Hypertension, Hepatitis B, Hypertension, Hepatitis B, Osteoarthritis  Osteoarthritis 07/10/2022 07/26/2022 N/A Date Acquired: 4 2 N/A Weeks of Treatment: Open Open N/A Wound Status: No No N/A Wound Recurrence: 0.3x0.3x0.1 1x5.4x0.1 N/A Measurements L x W x D (cm) 0.071 4.241 N/A A (cm) : rea 0.007 0.424 N/A Volume (cm) : -129.00% -96.30% N/A % Reduction in Area: -133.30% -96.30% N/A % Reduction in Volume: Full Thickness Without Exposed Full Thickness Without Exposed N/A Classification: Support Structures Support Structures None Present Medium N/A Exudate A mount: N/A Serosanguineous N/A Exudate Type: N/A red, brown N/A Exudate Color: Distinct, outline attached Distinct, outline attached N/A Wound Margin: None Present (0%) Medium (34-66%) N/A Granulation Amount: N/A Red N/A Granulation Quality: Large (67-100%) Medium (34-66%) N/A Necrotic Amount: Eschar Adherent Slough N/A Necrotic Tissue: Fascia: No Fat Layer (Subcutaneous Tissue): Yes N/A Exposed Structures: Fat Layer (Subcutaneous Tissue): No Tendon: No Muscle: No Joint: No Bone: No Large (67-100%) Small (1-33%) N/A Epithelialization: Debridement - Selective/Open Wound Debridement - Selective/Open Wound N/A Debridement: [name] ([id number]) [id number] Page 4 of 9 11:26 11:26 N/A Pre-procedure Verification/Time Out Taken: Callus, [facility] N/A Tissue Debrided: Non-Viable Tissue Non-Viable Tissue N/A Level: 0.11 4.24 N/A Debridement A (sq cm): rea Curette Curette N/A Instrument: Minimum Minimum N/A Bleeding: Pressure Pressure N/A Hemostasis A chieved: 0 0 N/A Procedural Pain: 0 0 N/A Post Procedural Pain: Procedure was tolerated well Procedure was tolerated well N/A Debridement Treatment Response: 0.3x0.3x0.1 1x5.4x0.1 N/A Post Debridement Measurements L x W x D (cm) 0.007 0.424 N/A Post Debridement Volume: (cm) Callus: Yes Scarring: Yes N/A Periwound Skin Texture: No Abnormalities Noted N/A Periwound Skin  Moisture: No Abnormalities Noted Ecchymosis: Yes N/A Periwound Skin Color: No Abnormality No Abnormality N/A Temperature: N/A Debridement N/A Procedures Performed: Treatment Notes Electronic Signature(s) Signed: 08/11/2022 11:37:33 AM By: Duanne Guess MD FACS Entered By: Duanne Guess on 08/11/2022 11:37:32 -------------------------------------------------------------------------------- Multi-Disciplinary Care Plan Details Patient Name: Date of Service: [name], [name] 08/11/2022 10:45 A M Medical Record Number: [id number] Patient Account Number: [id number] Date of Birth/Sex: Treating RN: 05-05-1930 (89 y.o. [name] Primary Care Tali Coster: Romeo Rabon Other Clinician: Referring Jonasia Coiner: Treating Ayana Imhof/Extender: [name] in Treatment: 20 Multidisciplinary Care Plan reviewed with physician Active Inactive Abuse / Safety / Falls / Self Care Management Nursing Diagnoses: History of Falls Impaired physical mobility Potential for falls Goals: Patient will not experience any injury related to falls Date Initiated: 03/20/2022 Date Inactivated: 05/29/2022 Target Resolution Date: 05/05/2022 Goal Status: Met Patient/caregiver will demonstrate safe use of adaptive devices to increase mobility Date Initiated: 03/20/2022 Date Inactivated: 04/27/2022 Target Resolution Date: 05/05/2022 Goal Status: Met Patient/caregiver will verbalize/demonstrate measures taken to prevent injury and/or falls Date Initiated: 05/29/2022 Target Resolution Date: 08/30/2022 Goal Status: Active Interventions: Assess: immobility, friction, shearing, incontinence upon  admission and as needed Provide education on fall prevention Notes: Venous Leg Ulcer Nursing Diagnoses: Knowledge deficit related to disease process and management [name], [name] ([id number]) [id number] Page 5 of 9 Potential for venous Insuffiency (use before diagnosis  confirmed) Goals: Patient will maintain optimal edema control Date Initiated: 05/22/2022 Target Resolution Date: 08/30/2022 Goal Status: Active Interventions: Assess peripheral edema status every visit. Compression as ordered Provide education on venous insufficiency Treatment Activities: Therapeutic compression applied : 05/22/2022 Notes: Wound/Skin Impairment Nursing Diagnoses: Impaired tissue integrity Knowledge deficit related to ulceration/compromised skin integrity Goals: Patient/caregiver will verbalize understanding of skin care regimen Date Initiated: 03/20/2022 Target Resolution Date: 08/30/2022 Goal Status: Active Interventions: Assess ulceration(s) every visit Treatment Activities: Skin care regimen initiated : 03/20/2022 Topical wound management initiated : 03/20/2022 Notes: Electronic Signature(s) Signed: 08/16/2022 7:59:22 AM By: Brenton Grills Entered By: Brenton Grills on 08/11/2022 11:15:35 -------------------------------------------------------------------------------- Pain Assessment Details Patient Name: Date of Service: [name], [name] 08/11/2022 10:45 A M Medical Record Number: [id number] Patient Account Number: [id number] Date of Birth/Sex: Treating RN: 1930/12/04 (89 y.o. [name] Primary Care Najmo Pardue: Romeo Rabon Other Clinician: Referring Luz Burcher: Treating Teagyn Fishel/Extender: [name] in Treatment: 20 Active Problems Location of Pain Severity and Description of Pain Patient Has Paino No Site Locations [place], [place] ([id number]) [id number] Page 6 of 9 Pain Management and Medication Current Pain Management: Electronic Signature(s) Signed: 08/16/2022 7:59:22 AM By: Brenton Grills Entered By: Brenton Grills on 08/11/2022 11:09:41 -------------------------------------------------------------------------------- Patient/Caregiver Education Details Patient Name: Date of  Service: [name], [name] 7/12/2024andnbsp10:45 A M Medical Record Number: [id number] Patient Account Number: [id number] Date of Birth/Gender: Treating RN: 1930-12-23 (89 y.o. [name] Primary Care Physician: Romeo Rabon Other Clinician: Referring Physician: Treating Physician/Extender: [name] in Treatment: 20 Education Assessment Education Provided To: Patient and Caregiver Education Topics Provided Wound/Skin Impairment: Methods: Explain/Verbal Responses: State content correctly Electronic Signature(s) Signed: 08/16/2022 7:59:22 AM By: Brenton Grills Entered By: Brenton Grills on 08/11/2022 11:15:55 -------------------------------------------------------------------------------- Wound Assessment Details Patient Name: Date of Service: [name], [name] 08/11/2022 10:45 A M Medical Record Number: [id number] Patient Account Number: [id number] Date of Birth/Sex: Treating RN: [birth year] (89 y.o. [name] Primary Care Emberlie Gotcher: Romeo Rabon Other Clinician: Referring Janeshia Ciliberto: Treating Kadey Mihalic/Extender: Marcie Bal [place], [name] ([id number]) [id number] Page 7 of 9 Weeks in Treatment: 20 Wound Status Wound Number: 12 Primary Lymphedema Etiology: Wound Location: Left T Fifth oe Wound Open Wounding Event: Gradually Appeared Status: Date Acquired: 07/10/2022 Comorbid Cataracts, Lymphedema, Sleep Apnea, Coronary Artery Disease, Weeks Of Treatment: 4 History: Hypertension, Hepatitis B, Osteoarthritis Clustered Wound: No Wound Measurements Length: (cm) 0.3 Width: (cm) 0.3 Depth: (cm) 0.1 Area: (cm) 0.071 Volume: (cm) 0.007 % Reduction in Area: -129% % Reduction in Volume: -133.3% Epithelialization: Large (67-100%) Tunneling: No Undermining: No Wound Description Classification: Full Thickness Without Exposed Suppor Wound Margin: Distinct, outline attached Exudate Amount:  None Present t Structures Foul Odor After Cleansing: No Slough/Fibrino No Wound Bed Granulation Amount: None Present (0%) Exposed Structure Necrotic Amount: Large (67-100%) Fascia Exposed: No Necrotic Quality: Eschar Fat Layer (Subcutaneous Tissue) Exposed: No Tendon Exposed: No Muscle Exposed: No Joint Exposed: No Bone Exposed: No Periwound Skin Texture Texture Color No Abnormalities Noted: No No Abnormalities Noted: Yes Callus: Yes Temperature / Pain Temperature: No Abnormality Moisture No Abnormalities Noted: Yes Treatment Notes Wound #12 (Toe Fifth) Wound Laterality: Left Cleanser Peri-Wound Care Topical Primary Dressing Maxorb Extra Ag+ Alginate Dressing, 4x4.75 (in/in) Discharge Instruction: Apply  to wound bed as instructed Secondary Dressing Secured With Compression Wrap Urgo K2 Lite, (equivalent to a 3 layer) two layer compression system, regular Discharge Instruction: Apply Urgo K2 Lite as directed (alternative to 3 layer compression). Compression Stockings Add-Ons Electronic Signature(s) Signed: 08/16/2022 7:59:22 AM By: Brenton Grills Entered By: Brenton Grills on 08/11/2022 11:29:58 [name] ([id number]) [id number] Page 8 of 9 -------------------------------------------------------------------------------- Wound Assessment Details Patient Name: Date of Service: [name], [place] RO L 08/11/2022 10:45 A M Medical Record Number: [id number] Patient Account Number: [id number] Date of Birth/Sex: Treating RN: 1930-12-13 (89 y.o. [name] Primary Care Elfego Giammarino: Romeo Rabon Other Clinician: Referring Zev Blue: Treating Cagney Steenson/Extender: [name] in Treatment: 20 Wound Status Wound Number: 13 Primary Lymphedema Etiology: Wound Location: Right, Lateral Lower Leg Wound Open Wounding Event: Contusion/Bruise Status: Date Acquired: 07/26/2022 Comorbid Cataracts, Lymphedema, Sleep Apnea,  Coronary Artery Disease, Weeks Of Treatment: 2 History: Hypertension, Hepatitis B, Osteoarthritis Clustered Wound: No Photos Wound Measurements Length: (cm) 1 Width: (cm) 5.4 Depth: (cm) 0.1 Area: (cm) 4.241 Volume: (cm) 0.424 % Reduction in Area: -96.3% % Reduction in Volume: -96.3% Epithelialization: Small (1-33%) Tunneling: No Undermining: No Wound Description Classification: Full Thickness Without Exposed Support Wound Margin: Distinct, outline attached Exudate Amount: Medium Exudate Type: Serosanguineous Exudate Color: red, brown Structures Foul Odor After Cleansing: No Slough/Fibrino Yes Wound Bed Granulation Amount: Medium (34-66%) Exposed Structure Granulation Quality: Red Fat Layer (Subcutaneous Tissue) Exposed: Yes Necrotic Amount: Medium (34-66%) Necrotic Quality: Adherent Slough Periwound Skin Texture Texture Color No Abnormalities Noted: No No Abnormalities Noted: No Scarring: Yes Ecchymosis: Yes Moisture Temperature / Pain No Abnormalities Noted: No Temperature: No Abnormality Treatment Notes Wound #13 (Lower Leg) Wound Laterality: Right, Lateral Cleanser Peri-Wound Care Topical Shepherd, Dariela ([id number]) [id number] Page 9 of 9 Primary Dressing Maxorb Extra Ag+ Alginate Dressing, 4x4.75 (in/in) Discharge Instruction: Apply to wound bed as instructed Secondary Dressing Secured With Compression Wrap Urgo K2 Lite, (equivalent to a 3 layer) two layer compression system, regular Discharge Instruction: Apply Urgo K2 Lite as directed (alternative to 3 layer compression). Compression Stockings Add-Ons Electronic Signature(s) Signed: 08/16/2022 7:59:22 AM By: Brenton Grills Entered By: Brenton Grills on 08/11/2022 11:22:23 -------------------------------------------------------------------------------- Vitals Details Patient Name: Date of Service: [name], [name] 08/11/2022 10:45 A M Medical Record Number:  [id number] Patient Account Number: [id number] Date of Birth/Sex: Treating RN: 28-Jun-1930 (89 y.o. [name] Primary Care Naphtali Riede: Romeo Rabon Other Clinician: Referring Deaken Jurgens: Treating Cruze Zingaro/Extender: [name] in Treatment: 20 Vital Signs Time Taken: 11:05 Temperature (F): 97.5 Height (in): 68 Pulse (bpm): 58 Weight (lbs): 180 Respiratory Rate (breaths/min): 18 Body Mass Index (BMI): 27.4 Blood Pressure (mmHg): 116/77 Reference Range: 80 - 120 mg / dl Electronic Signature(s) Signed: 08/16/2022 7:59:22 AM By: Brenton Grills Entered By: Brenton Grills on 08/11/2022 11:09:32

## 2022-08-18 ENCOUNTER — Encounter (HOSPITAL_BASED_OUTPATIENT_CLINIC_OR_DEPARTMENT_OTHER): Payer: Medicare Other | Admitting: General Surgery

## 2022-08-18 DIAGNOSIS — L97522 Non-pressure chronic ulcer of other part of left foot with fat layer exposed: Secondary | ICD-10-CM | POA: Diagnosis not present

## 2022-08-21 NOTE — Progress Notes (Addendum)
Linda, [name] ([id number]) [id number] Page 1 of 9 Visit Report for 08/18/2022 Chief Complaint Document Details Patient Name: Date of Service: [name],  [name] 08/18/2022 10:45 A M Medical Record Number: [id number] Patient Account Number: [id number] Date of Birth/Sex: Treating RN: [birth year] (89 y.o. F) Primary Care Provider: Romeo Rabon Other Clinician: Referring Provider: Treating Provider/Extender: [name] in Treatment: 21 Information Obtained from: Patient Chief Complaint Patient presents for treatment of open ulcers due to venous insufficiency Electronic Signature(s) Signed: 08/28/2022 8:23:48 AM By: Duanne Guess MD FACS Entered By: Duanne Guess on 08/28/2022 08:23:48 -------------------------------------------------------------------------------- Debridement Details Patient Name: Date of Service: [name], [place] [name] 08/18/2022 10:45 A M Medical Record Number: [id number] Patient Account Number: [id number] Date of Birth/Sex: Treating RN: 12/27/1930 (89 y.o. Gevena Mart Primary Care Provider: Romeo Rabon Other Clinician: Referring Provider: Treating Provider/Extender: [name] in Treatment: 21 Debridement Performed for Assessment: Wound #13 Right,Lateral Lower Leg Performed By: Physician Duanne Guess, MD Debridement Type: Debridement Level of Consciousness (Pre-procedure): Awake and Alert Pre-procedure Verification/Time Out Yes - 11:00 Taken: Start Time: 11:02 Pain Control: Lidocaine 4% T opical Solution Percent of Wound Bed Debrided: 100% T Area Debrided (cm): otal 1.96 Tissue and other material debrided: Non-Viable, Slough, Slough Level: Non-Viable Tissue Debridement Description: Selective/Open Wound Instrument: Curette Bleeding: Minimum Hemostasis Achieved: Pressure End Time: 11:05 Procedural Pain: 0 Post Procedural Pain: 0 Response to Treatment: Procedure  was tolerated well Level of Consciousness (Post- Awake and Alert procedure): Post Debridement Measurements of Total Wound Length: (cm) 1 Width: (cm) 2.5 Depth: (cm) 0.1 Volume: (cm) 0.196 Character of Wound/Ulcer Post Debridement: Improved [name], [name] ([id number]) [id number].pdf Page 2 of 9 Post Procedure Diagnosis Same as Pre-procedure Notes Scribed for Dr Mikey Bussing by Brenton Grills RN Electronic Signature(s) Signed: 08/21/2022 12:39:00 PM By: Duanne Guess MD FACS Signed: 08/23/2022 11:11:00 AM By: Brenton Grills Entered By: Brenton Grills on 08/21/2022 12:11:53 -------------------------------------------------------------------------------- HPI Details Patient Name: Date of Service: [name], [place] [name] 08/18/2022 10:45 A M Medical Record Number: [id number] Patient Account Number: [id number] Date of Birth/Sex: Treating RN: 09/17/1930 (89 y.o. F) Primary Care Provider: Romeo Rabon Other Clinician: Referring Provider: Treating Provider/Extender: [name] in Treatment: 21 History of Present Illness HPI Description: ADMISSION 03/20/2022 This is an 89 year old woman with coronary artery disease, on chronic corticosteroids for osteoarthritis, who presents with bilateral lower leg ulcers. They have been present for a couple of weeks. I do not have any vascular studies available to discern whether or not she has had venous reflux or arterial studies done. ABIs in clinic were 1.34 on the left and 0.95 on the right. She is not diabetic. She does not smoke. Her legs are quite edematous and she has multiple hematomas of various sizes and ages extending up to at least the distal thigh. She has 3 ulcers on the left leg with slough and eschar accumulation. On the right leg, she has a tiny superficial ulcer on the anterior tibial surface. She also has a small ulcer on the lateral aspect of her right third toe. 03/27/2022: The ulcer on her  right anterior tibial surface and lateral third toe are both healed. The ulcers on her left leg are smaller but have slough accumulation. Edema control is [name]. Her juxta lite stockings arrived in the mail, but she did not bring them with her today. 04/06/2022: She has a new skin tear just distal to her left knee. The ulcer on her left upper posterior  calf is nearly healed under a layer of eschar. She has a spot on her right second toe that she is concerned about, saying that she frequently gets an ulcer there, but I do not see any open wound in this site. 04/21/2022: All of her wounds are healed except for the skin tear distal to her left knee. There is a layer of eschar on the surface. Underneath, the wound is quite superficial and clean. 04/19/2022: All of her wounds are healed. She is wearing a juxta lite stocking on her right leg and has brought her other stocking for [place] to apply to her left. 05/22/2022: She returns today with new ulcers on her left leg. A large hematoma/blood blister on her upper lateral leg, just distal to the knee opened up shortly after she was healed out from our clinic. There is slough on the wound surface, but no concern for infection. She has a second ulcer on her anterior tibial surface with slough on the surface. The small site on her right second toe that we have been monitoring has opened. There is some slough in the center with surrounding callus. She has a hematoma/blood blister on the medial aspect of her right lower leg that is not open, but it is certainly threatening to do so. 05/29/2022: The hematoma that had threatened to open last week is now open. There is slough and hematoma and nonviable subcutaneous tissue present. The toe ulcer is down to a small pinhole, but there is some undermining from some dry skin present. The ulcers on her left leg are both smaller. There is some slough accumulation on both. 06/05/2022: The more proximal of the left leg ulcers is closed by  about 50%. There is some eschar on the wound surface. The anterior left leg ulcer is nearly healed with just a little eschar on the remaining open portion. The lateral foot ulcer has some eschar and slough present the most recent right lower leg wound has slough and fat exposed. The right second toe ulcer looks about the same with a layer of callus and slough. 06/13/2022: The anterior left leg ulcer is closed. The proximal left lower leg ulcer is down to just about a centimeter with some slough and eschar on the surface. The right foot ulcer is healed. The right anterior lower leg wound continues to accumulate slough but is a little bit smaller today. The right second toe ulcer is smaller underneath the layer of callus and slough. 06/19/2022: The proximal left lower leg ulcer is down to just a couple of millimeters. It is clean. The right anterior lower leg wound is smaller again today, but still with some slough buildup. The right second toe ulcer is down to just a pinpoint opening underneath some callus. 07/03/2022: The left lower leg ulcer is closed. The right anterior lower leg wound is smaller again this week. There is a little bit of light slough accumulation. The right second toe ulcer is unchanged underneath its layer of callus. She is also complaining of pain related to a callus in her midfoot as well as one on her fifth toe. 07/10/2022: The right anterior lower leg wound is nearly closed underneath the layer of eschar. The right second toe ulcer appears to have healed. The fifth toe callus on her left foot has reaccumulated and she says it is causing her pain. After debridement, I discovered an ulcer at the base of this callus. 07/17/2022: The right anterior lower leg wound is closed. The right second  toe ulcer remains closed. The fifth toe ulcer on her left foot has reaccumulated some callus. The small wound remains open with some slough buildup. 07/28/2022: She has a new skin tear on her right  lateral lower leg. She says it happened while she was pulling up her stockings, but denies snagging it with her CARLEEN, RHUE ([id number]) [id number] Page 3 of 9 jewelry or a fingernail. The skin flap seems to be at least partially adhered down in the exposed area is clean, but it does extend to the fat layer. The wound on her lateral fifth toe on the left is nearly closed under a layer of callus. The right second toe wound has not reopened. 08/04/2022: Much of the skin at the site of her skin tear has survived. There is still a band of tissue that appears compromised and may not ultimately survive. There is slough on the wound surface. The left fifth toe has a tiny pinhole opening that remains under some callus and slough. There is an area on her right fifth toe, mirroring the left, that is somewhat discolored but not yet open. 08/11/2022: The left fifth toe site has healed. The right fifth toe site, however, looks more suspicious today and after the callus was removed, a small opening was appreciated. The right lower leg wound is smaller. There is slough on the surface. 08/18/22: Fifth toe still healed continuing optifoam ; wound on lateral leg decreased. Electronic Signature(s) Signed: 08/28/2022 8:23:59 AM By: Duanne Guess MD FACS Previous Signature: 08/25/2022 4:29:54 PM Version By: Thayer Dallas Entered By: Duanne Guess on 08/28/2022 08:23:59 -------------------------------------------------------------------------------- Physical Exam Details Patient Name: Date of Service: [name], [place] [name] 08/18/2022 10:45 A M Medical Record Number: [id number] Patient Account Number: [id number] Date of Birth/Sex: Treating RN: 1931/01/03 (89 y.o. F) Primary Care Provider: Romeo Rabon Other Clinician: Referring Provider: Treating Provider/Extender: [name] in Treatment: 21 Notes 08/18/22: Thin eschar on right lateral leg wound with 50%  decrease in wound size, light slough callus on right fifth toe, but zero wound. Electronic Signature(s) Signed: 08/28/2022 8:24:11 AM By: Duanne Guess MD FACS Previous Signature: 08/25/2022 4:29:54 PM Version By: Thayer Dallas Entered By: Duanne Guess on 08/28/2022 08:24:11 -------------------------------------------------------------------------------- Physician Orders Details Patient Name: Date of Service: [name], [place] [name] 08/18/2022 10:45 A M Medical Record Number: [id number] Patient Account Number: [id number] Date of Birth/Sex: Treating RN: 1930-12-28 (89 y.o. Gevena Mart Primary Care Provider: Romeo Rabon Other Clinician: Referring Provider: Treating Provider/Extender: [name] in Treatment: 21 Verbal / Phone Orders: No Diagnosis Coding ICD-10 Coding Code Description [phone] 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ed L97.512 Non-pressure chronic ulcer of other part of right foot with fat layer exposed L84 Corns and callosities R60.0 Localized edema I87.2 Venous insufficiency (chronic) (peripheral) Z79.52 Long term (current) use of systemic steroids Follow-up Appointments ppointment in 2 weeks. - Dr. Lady Gary - Room 3 Return [place], [place] ([id number]) [id number] Page 4 of 9 Bathing/ Shower/ Hygiene May shower with protection but do not get wound dressing(s) wet. Protect dressing(s) with water repellant cover (for example, large plastic bag) or a cast cover and may then take shower. Edema Control - Lymphedema / SCD / Other Elevate legs to the level of the heart or above for 30 minutes daily and/or when sitting for 3-4 times a day throughout the day. Avoid standing for long periods of time. Exercise regularly Moisturize legs daily. - left leg  daily Compression stocking or Garment 20-30 mm/Hg pressure to: - juxtalite to left leg daily Electronic  Signature(s) Signed: 08/28/2022 10:26:58 AM By: Duanne Guess MD FACS Previous Signature: 08/21/2022 12:39:00 PM Version By: Duanne Guess MD FACS Previous Signature: 08/23/2022 11:11:00 AM Version By: Brenton Grills Entered By: Duanne Guess on 08/28/2022 08:24:22 -------------------------------------------------------------------------------- Problem List Details Patient Name: Date of Service: [name], [place] [name] 08/18/2022 10:45 A M Medical Record Number: [id number] Patient Account Number: [id number] Date of Birth/Sex: Treating RN: [birth year] (89 y.o. 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 foot with fat layer exposed 08/11/2022 No Yes L84 Corns and callosities 06/05/2022 No Yes R60.0 Localized edema 03/20/2022 No Yes I87.2 Venous insufficiency (chronic) (peripheral) 03/20/2022 No Yes Z79.52 Long term (current) use of systemic steroids 03/20/2022 No Yes Inactive Problems Resolved Problems ICD-10 Code Description Active Date Resolved Date L97.811 Non-pressure chronic ulcer of other part of right lower leg limited to breakdown of skin 03/20/2022 03/20/2022 L97.822 Non-pressure chronic ulcer of other part of left lower leg with fat layer exposed 05/22/2022 03/20/2022 [name] ([id number]) [id number] Page 5 of 9 L97.511 Non-pressure chronic ulcer of other part of right foot limited to breakdown of skin 03/20/2022 03/20/2022 L97.812 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ed 05/29/2022 05/29/2022 L97.522 Non-pressure chronic ulcer of other part of left foot with  fat layer exposed 07/10/2022 07/10/2022 Electronic Signature(s) Signed: 08/28/2022 8:23:26 AM By: Duanne Guess MD FACS Previous Signature: 08/21/2022 12:11:11 PM Version By: Duanne Guess MD FACS Entered By: Duanne Guess on 08/28/2022 08:23:26 -------------------------------------------------------------------------------- Progress Note Details Patient Name: Date of Service: [name], [place] [name] 08/18/2022 10:45 A M Medical Record Number: [id number] Patient Account Number: [id number] Date of Birth/Sex: Treating RN: Dec 23, 1930 (89 y.o. F) Primary Care Provider: Romeo Rabon Other Clinician: Referring Provider: Treating Provider/Extender: [name] in Treatment: 21 Subjective Chief Complaint Information obtained from Patient Patient presents for treatment of open ulcers due to venous insufficiency History of Present Illness (HPI) ADMISSION 03/20/2022 This is an 89 year old woman with coronary artery disease, on chronic corticosteroids for osteoarthritis, who presents with bilateral lower leg ulcers. They have been present for a couple of weeks. I do not have any vascular studies available to discern whether or not she has had venous reflux or arterial studies done. ABIs in clinic were 1.34 on the left and 0.95 on the right. She is not diabetic. She does not smoke. Her legs are quite edematous and she has multiple hematomas of various sizes and ages extending up to at least the distal thigh. She has 3 ulcers on the left leg with slough and eschar accumulation. On the right leg, she has a tiny superficial ulcer on the anterior tibial surface. She also has a small ulcer on the lateral aspect of her right third toe. 03/27/2022: The ulcer on her right anterior tibial surface and lateral third toe are both healed. The ulcers on her left leg are smaller but have slough accumulation. Edema control is [name]. Her juxta lite stockings arrived in the mail, but she did  not bring them with her today. 04/06/2022: She has a new skin tear just distal to her left knee.  The ulcer on her left upper posterior calf is nearly healed under a layer of eschar. She has a spot on her right second toe that she is concerned about, saying that she frequently gets an ulcer there, but I do not see any open wound in this site. 04/21/2022: All of her wounds are healed except for the skin tear distal to her left knee. There is a layer of eschar on the surface. Underneath, the wound is quite superficial and clean. 04/19/2022: All of her wounds are healed. She is wearing a juxta lite stocking on her right leg and has brought her other stocking for [place] to apply to her left. 05/22/2022: She returns today with new ulcers on her left leg. A large hematoma/blood blister on her upper lateral leg, just distal to the knee opened up shortly after she was healed out from our clinic. There is slough on the wound surface, but no concern for infection. She has a second ulcer on her anterior tibial surface with slough on the surface. The small site on her right second toe that we have been monitoring has opened. There is some slough in the center with surrounding callus. She has a hematoma/blood blister on the medial aspect of her right lower leg that is not open, but it is certainly threatening to do so. 05/29/2022: The hematoma that had threatened to open last week is now open. There is slough and hematoma and nonviable subcutaneous tissue present. The toe ulcer is down to a small pinhole, but there is some undermining from some dry skin present. The ulcers on her left leg are both smaller. There is some slough accumulation on both. 06/05/2022: The more proximal of the left leg ulcers is closed by about 50%. There is some eschar on the wound surface. The anterior left leg ulcer is nearly healed with just a little eschar on the remaining open portion. The lateral foot ulcer has some eschar and slough present the  most recent right lower leg wound has slough and fat exposed. The right second toe ulcer looks about the same with a layer of callus and slough. 06/13/2022: The anterior left leg ulcer is closed. The proximal left lower leg ulcer is down to just about a centimeter with some slough and eschar on the surface. The right foot ulcer is healed. The right anterior lower leg wound continues to accumulate slough but is a little bit smaller today. The right second toe ulcer is smaller underneath the layer of callus and slough. 06/19/2022: The proximal left lower leg ulcer is down to just a couple of millimeters. It is clean. The right anterior lower leg wound is smaller again today, but still with some slough buildup. The right second toe ulcer is down to just a pinpoint opening underneath some callus. 07/03/2022: The left lower leg ulcer is closed. The right anterior lower leg wound is smaller again this week. There is a little bit of light slough accumulation. [facility] [place], [name] ([id number]) [id number] Page 6 of 9 right second toe ulcer is unchanged underneath its layer of callus. She is also complaining of pain related to a callus in her midfoot as well as one on her fifth toe. 07/10/2022: The right anterior lower leg wound is nearly closed underneath the layer of eschar. The right second toe ulcer appears to have healed. The fifth toe callus on her left foot has reaccumulated and she says it is causing her pain. After debridement, I discovered an ulcer at the base  of this callus. 07/17/2022: The right anterior lower leg wound is closed. The right second toe ulcer remains closed. The fifth toe ulcer on her left foot has reaccumulated some callus. The small wound remains open with some slough buildup. 07/28/2022: She has a new skin tear on her right lateral lower leg. She says it happened while she was pulling up her stockings, but denies snagging it with her jewelry or a fingernail. The  skin flap seems to be at least partially adhered down in the exposed area is clean, but it does extend to the fat layer. The wound on her lateral fifth toe on the left is nearly closed under a layer of callus. The right second toe wound has not reopened. 08/04/2022: Much of the skin at the site of her skin tear has survived. There is still a band of tissue that appears compromised and may not ultimately survive. There is slough on the wound surface. The left fifth toe has a tiny pinhole opening that remains under some callus and slough. There is an area on her right fifth toe, mirroring the left, that is somewhat discolored but not yet open. 08/11/2022: The left fifth toe site has healed. The right fifth toe site, however, looks more suspicious today and after the callus was removed, a small opening was appreciated. The right lower leg wound is smaller. There is slough on the surface. 08/18/22: Fifth toe still healed continuing optifoam ; wound on lateral leg decreased. Patient History Information obtained from Patient. Family History Heart Disease - Father, Hypertension - Mother, No family history of Cancer, Diabetes, Hereditary Spherocytosis, Kidney Disease, Lung Disease, Seizures, Stroke, Thyroid Problems, Tuberculosis. Social History Never smoker, Marital Status - Married, Alcohol Use - Rarely, Drug Use - No History, Caffeine Use - Moderate. 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 - cardiac catheterization. - cataract extraction bilat.. - colonoscopy. - bilateral total knee replacement. - upper GI endoscopy. Medical A  Surgical History Notes nd Gastrointestinal diverticulosis, GERD Musculoskeletal osteoporosis Objective Constitutional Vitals Time Taken: 10:54 AM, Height: 68 in, Weight: 180 lbs, BMI: 27.4, Temperature: 98.3 F, Pulse: 62 bpm, Respiratory Rate: 18 breaths/min, Blood Pressure: 192/84 mmHg. Integumentary (Hair, Skin) Wound #12 status is Healed - Epithelialized. Original cause of wound was Gradually Appeared. The date acquired was: 07/10/2022. The wound has been in treatment 5 weeks. The wound is located on the Left T Fifth. The wound measures 0cm length x 0cm width x 0cm depth; 0cm^2 area and 0cm^3 volume. oe There is no tunneling or undermining noted. There is a none present amount of drainage noted. The wound margin is distinct with the outline attached to the wound base. There is no granulation within the wound bed. There is a large (67-100%) amount of necrotic tissue within the wound bed including Eschar. The periwound skin appearance had no abnormalities noted for moisture. The periwound skin appearance had no abnormalities noted for color. The periwound skin appearance exhibited: Callus. Periwound temperature was noted as No Abnormality. Wound #13 status is Open. Original cause of wound was Contusion/Bruise. The date acquired was: 07/26/2022. The wound has been in treatment 3 weeks. The wound is located on the Right,Lateral Lower Leg. The wound measures 1cm length x 2.5cm width x  0.1cm depth; 1.963cm^2 area and 0.196cm^3 volume. There is Fat Layer (Subcutaneous Tissue) exposed. There is no tunneling or undermining noted. There is a medium amount of serosanguineous drainage noted. The wound margin is distinct with the outline attached to the wound base. There is medium (34-66%) red granulation within the wound bed. There is a medium (34- 66%) amount of necrotic tissue within the wound bed including Adherent Slough. The periwound skin appearance exhibited: Scarring, Ecchymosis.  Periwound temperature was noted as No Abnormality. 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 - Dr. Lady Gary - Room 3 Bathing/ Shower/ Hygiene: May shower with protection but do not get wound dressing(s) wet. Protect dressing(s) with water repellant cover (for example, large plastic bag) or a cast cover and may then take shower. Edema Control - Lymphedema / SCD / Other: Elevate legs to the level of the heart or above for 30 minutes daily and/or when sitting for 3-4 times a day throughout the day. Avoid standing for long periods of time. Exercise regularly Moisturize legs daily. - left leg daily Compression stocking or Garment 20-30 mm/Hg pressure to: - juxtalite to left leg daily Wound healing faster than anticipated 1. Debrided slough and eschar with curette. 2. Continue AgAlg with Urgo light, pad fifth toe with foam. 3. Follow up one week, bring juxtalite. Electronic Signature(s) Signed: 08/28/2022 8:24:57 AM By: Duanne Guess MD FACS Previous Signature: 08/25/2022 4:29:54 PM Version By: Thayer Dallas Entered By: Duanne Guess on 08/28/2022 08:24:57 -------------------------------------------------------------------------------- HxROS Details Patient Name: Date of Service: [name], [place] [name] 08/18/2022 10:45 A M Medical Record Number: [id number] Patient Account Number: [id number] Date of Birth/Sex: Treating RN: [birth year] (89 y.o. F) Primary Care Provider: Romeo Rabon Other Clinician: Referring Provider: Treating Provider/Extender: [name] in Treatment: 21 Information Obtained From Patient Eyes Medical History: Positive for: Cataracts Hematologic/Lymphatic Medical History: Positive for: Lymphedema Respiratory Medical History: Positive for: Sleep Apnea [name], [name] ([id number]) [id number] Page 8 of 9 Cardiovascular Medical History: Positive for: Coronary Artery Disease; Hypertension Gastrointestinal Medical History: Positive for: Hepatitis B - 50+ yrs ago Past Medical History Notes: diverticulosis, GERD Musculoskeletal Medical History: Positive for: Osteoarthritis Past Medical History Notes: osteoporosis HBO Extended History Items Eyes: Cataracts Immunizations Pneumococcal Vaccine: Received Pneumococcal Vaccination: Yes Received Pneumococcal Vaccination On or After 60th Birthday: Yes Implantable Devices No devices added Hospitalization / Surgery History Type of Hospitalization/Surgery abdominal hysterectomy cardiac catheterization cataract extraction bilat. colonoscopy bilateral total knee replacement upper GI endoscopy Family and Social History Cancer: No; Diabetes: No; Heart Disease: Yes - Father; Hereditary Spherocytosis: No; Hypertension: Yes - Mother; Kidney Disease: No; Lung Disease: No; Seizures: No; Stroke: No; Thyroid Problems: No; Tuberculosis: No; Never smoker; Marital Status - Married; Alcohol Use: Rarely; Drug Use: No History; Caffeine Use: Moderate; Financial Concerns: No; Food, Clothing or Shelter Needs: No; Support System Lacking: No; Transportation Concerns: No Electronic Signature(s) Signed: 08/28/2022 10:26:58 AM By: Duanne Guess MD FACS Entered By: Duanne Guess on 08/28/2022 08:24:04 -------------------------------------------------------------------------------- SuperBill Details Patient Name: Date of Service: [name], [place] [name] 08/18/2022 Medical Record Number: [id number] Patient Account Number: [id number] Date of Birth/Sex:  Treating RN: [birth year] (89 y.o. 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 L84 Corns and callosities [name], [name] ([id number]) [id number].pdf Page 9 of 9 R60.0 Localized edema I87.2 Venous insufficiency (chronic) (peripheral) Z79.52 Long term (current) use of systemic steroids Facility Procedures : CPT4 Code: [id number] Description: [phone] - DEBRIDE WOUND 1ST 20 SQ CM OR < ICD-10 Diagnosis Description L97.812 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 L97.512 Non-pressure chronic ulcer of other part of right foot with fat layer  exposed Modifier: ed Quantity: 1 Physician Procedures : CPT4 Code Description Modifier [id number] - WC PHYS LEVEL 3 - EST PT 25 ICD-10 Diagnosis Description L97.812 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ed L97.512 Non-pressure chronic ulcer of other part of right  foot with fat layer exposed I87.2 Venous insufficiency (chronic) (peripheral) Z79.52 Long term (current) use of systemic steroids Quantity: 1 : [id number] - WC PHYS DEBR WO ANESTH 20 SQ CM ICD-10 Diagnosis Description L97.812 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ed L97.512 Non-pressure chronic ulcer of other part of right foot with fat layer  exposed Quantity: 1 Electronic Signature(s) Signed: 08/28/2022 8:30:09 AM By: Duanne Guess MD FACS Previous Signature: 08/21/2022 12:39:00 PM Version By: Duanne Guess MD FACS Previous Signature: 08/23/2022 11:11:00 AM Version By: Brenton Grills Previous Signature:  08/21/2022 12:11:56 PM Version By: Duanne Guess MD FACS Entered By: Duanne Guess on 08/28/2022 08:30:08

## 2022-08-23 NOTE — Progress Notes (Addendum)
[place], [name] ([id number]) [id number] Page 1 of 10 Visit Report for 08/18/2022 Arrival Information Details Patient Name: Date of Service: [name], [name] 08/18/2022 10:45 A M Medical Record Number: [id number] Patient Account Number: [id number] Date of Birth/Sex: Treating RN: 1930-05-03 (89 y.o. Gevena Mart Primary Care Herley Bernardini: Romeo Rabon Other Clinician: Referring Sostenes Kauffmann: Treating Milanya Sunderland/Extender: [name] in Treatment: 21 Visit Information History Since Last Visit All ordered tests and consults were completed: Yes Patient Arrived: Wheel Chair Added or deleted any medications: No Arrival Time: 12:06 Any new allergies or adverse reactions: No Accompanied By: spouse Had a fall or experienced change in No Transfer Assistance: None activities of daily living that may affect Patient Identification Verified: Yes risk of falls: Secondary Verification Process Completed: Yes Signs or symptoms of abuse/neglect since last visito No Patient Requires Transmission-Based Precautions: No Hospitalized since last visit: No Patient Has Alerts: No Implantable device outside of the clinic excluding No cellular tissue based products placed in the center since last visit: Has Dressing in Place as Prescribed: Yes Pain Present Now: No Electronic Signature(s) Signed: 08/23/2022 11:11:00 AM By: Brenton Grills Entered By: Brenton Grills on 08/21/2022 12:07:26 -------------------------------------------------------------------------------- Clinic Level of Care Assessment Details Patient Name: Date of Service: [name], [name] 08/18/2022 10:45 A M Medical Record Number: [id number] Patient Account Number: [id number] Date of Birth/Sex: Treating RN: [birth year] (89 y.o. Gevena Mart Primary Care Mehran Guderian: Romeo Rabon Other Clinician: Referring Ski Polich: Treating Eldonna Neuenfeldt/Extender: [name] in  Treatment: 21 Clinic Level of Care Assessment Items TOOL 1 Quantity Score X- 1 0 Use when EandM and Procedure is performed on INITIAL visit ASSESSMENTS - Nursing Assessment / Reassessment X- 1 20 General Physical Exam (combine w/ comprehensive assessment (listed just below) when performed on new pt. evals) X- 1 25 Comprehensive Assessment (HX, ROS, Risk Assessments, Wounds Hx, etc.) ASSESSMENTS - Wound and Skin Assessment / Reassessment X- 1 10 Dermatologic / Skin Assessment (not related to wound area) ASSESSMENTS - Ostomy and/or Continence Assessment and Care []  - 0 Incontinence Assessment and Management []  - 0 Ostomy Care Assessment and Management (repouching, etc.) PROCESS - Coordination of Care X - Simple Patient / Family Education for ongoing care 1 15 [place], [place] ([id number]) [phone].pdf Page 2 of 10 []  - 0 Complex (extensive) Patient / Family Education for ongoing care X- 1 10 Staff obtains Consents, Records, T Results / Process Orders est []  - 0 Staff telephones HHA, Nursing Homes / Clarify orders / etc []  - 0 Routine Transfer to another Facility (non-emergent condition) []  - 0 Routine Hospital Admission (non-emergent condition) []  - 0 New Admissions / Manufacturing engineer / Ordering NPWT Apligraf, etc. , []  - 0 Emergency Hospital Admission (emergent condition) PROCESS - Special Needs []  - 0 Pediatric / Minor Patient Management []  - 0 Isolation Patient Management []  - 0 Hearing / Language / Visual special needs []  - 0 Assessment of Community assistance (transportation, D/C planning, etc.) []  - 0 Additional assistance / Altered mentation []  - 0 Support Surface(s) Assessment (bed, cushion, seat, etc.) INTERVENTIONS - Miscellaneous []  - 0 External ear exam []  - 0 Patient Transfer (multiple staff / Nurse, adult / Similar devices) []  - 0 Simple Staple / Suture removal (25 or less) []  - 0 Complex Staple / Suture removal (26 or  more) []  - 0 Hypo/Hyperglycemic Management (do not check if billed separately) []  - 0 Ankle / Brachial Index (ABI) - do not check if billed separately Has  the patient been seen at the hospital within the last three years: Yes Total Score: 80 Level Of Care: New/Established - Level 3 Electronic Signature(s) Signed: 08/23/2022 11:11:00 AM By: Brenton Grills Entered By: Brenton Grills on 08/21/2022 12:14:34 -------------------------------------------------------------------------------- Encounter Discharge Information Details Patient Name: Date of Service: [name], [name] 08/18/2022 10:45 A M Medical Record Number: [id number] Patient Account Number: [id number] Date of Birth/Sex: Treating RN: Oct 11, 1930 (89 y.o. Gevena Mart Primary Care Conleigh Heinlein: Romeo Rabon Other Clinician: Referring Mylo Choi: Treating Jeanmarc Viernes/Extender: [name] in Treatment: 21 Encounter Discharge Information Items Post Procedure Vitals Discharge Condition: Stable Temperature (F): 98.3 Ambulatory Status: Wheelchair Pulse (bpm): 74 Discharge Destination: Home Respiratory Rate (breaths/min): 18 Transportation: Private Auto Blood Pressure (mmHg): 128/68 Accompanied By: self Schedule Follow-up Appointment: Yes Clinical Summary of Care: Patient Declined Electronic Signature(s) Signed: 08/23/2022 11:11:00 AM By: Brenton Grills Entered By: Brenton Grills on 08/21/2022 12:18:06 Valaria Good ([id number]) [id number] Page 3 of 10 -------------------------------------------------------------------------------- Lower Extremity Assessment Details Patient Name: Date of Service: [name], [name] 08/18/2022 10:45 A M Medical Record Number: [id number] Patient Account Number: [id number] Date of Birth/Sex: Treating RN: 1931-01-21 (89 y.o. Gevena Mart Primary Care Kaymon Denomme: Romeo Rabon Other Clinician: Referring Javelle Donigan: Treating Arlone Lenhardt/Extender:  [name] in Treatment: 21 Edema Assessment Assessed: Kyra Searles: No] Franne Forts: No] Edema: [Left: Ye] [Right: s] Calf Left: Right: Point of Measurement: From Medial Instep 32.5 cm Ankle Left: Right: Point of Measurement: From Medial Instep 20 cm Vascular Assessment Pulses: Dorsalis Pedis Palpable: [Right:Yes] Extremity colors, hair growth, and conditions: Extremity Color: [Right:Normal] Hair Growth on Extremity: [Right:No] Temperature of Extremity: [Right:Warm] Capillary Refill: [Right:< 3 seconds] Dependent Rubor: [Right:No No] Toe Nail Assessment Left: Right: Thick: Yes Discolored: Yes Deformed: Yes Improper Length and Hygiene: Yes Electronic Signature(s) Signed: 08/23/2022 11:11:00 AM By: Brenton Grills Entered By: Brenton Grills on 08/21/2022 12:08:43 -------------------------------------------------------------------------------- Multi Wound Chart Details Patient Name: Date of Service: [name], [name] 08/18/2022 10:45 A M Medical Record Number: [id number] Patient Account Number: [id number] Date of Birth/Sex: Treating RN: 1930-06-16 (89 y.o. F) Primary Care Laini Urick: Romeo Rabon Other Clinician: Referring Misael Mcgaha: Treating Christin Mccreedy/Extender: [name] in Treatment: 21 Vital Signs Height(in): 68 Pulse(bpm): 62 [place], [place] ([id number]) [phone].pdf Page 4 of 10 Weight(lbs): 180 Blood Pressure(mmHg): 192/84 Body Mass Index(BMI): 27.4 Temperature(F): 98.3 Respiratory Rate(breaths/min): 18 [12:Photos: No Photos Left T Fifth oe Wound Location: Gradually Appeared Wounding Event: Lymphedema Primary Etiology: Cataracts, Lymphedema, Sleep Comorbid History: Apnea, Coronary Artery Disease, Hypertension, Hepatitis B, Osteoarthritis 07/10/2022 Date Acquired: 5  Weeks of Treatment: Healed - Epithelialized Wound Status: No Wound Recurrence: 0x0x0 Measurements L x W x D (cm) 0 A (cm) : rea 0  Volume (cm) : 100.00% % Reduction in A rea: 100.00% % Reduction in Volume: Full Thickness Without Exposed Classification:  Support Structures None Present Exudate A mount: N/A Exudate Type: N/A Exudate Color: Distinct, outline attached Wound Margin: None Present (0%) Granulation A mount: N/A Granulation Quality: Large (67-100%) Necrotic A mount: Eschar Necrotic Tissue:  Fascia: No Exposed Structures: Fat Layer (Subcutaneous Tissue): No Tendon: No Muscle: No Joint: No Bone: No Large (67-100%) Epithelialization: N/A Debridement: Pre-procedure Verification/Time Out N/A Taken: N/A Pain Control: N/A Tissue Debrided: N/A  Level: N/A Debridement A (sq cm): rea N/A Instrument: N/A Bleeding: N/A Hemostasis A chieved: N/A Procedural Pain: N/A Post Procedural Pain: N/A Debridement Treatment Response: N/A Post Debridement Measurements L x W x D (cm) N/A Post Debridement Volume:  (cm)  Callus: Yes Periwound Skin Texture: No Abnormalities Noted Periwound Skin Moisture: No Abnormalities Noted Periwound Skin Color: No Abnormality Temperature: N/A Procedures Performed:] [13:No Photos Right, Lateral Lower Leg Contusion/Bruise  Lymphedema Cataracts, Lymphedema, Sleep Apnea, Coronary Artery Disease, Hypertension, Hepatitis B, Osteoarthritis 07/26/2022 3 Open No 1x2.5x0.1 1.963 0.196 9.10% 9.30% Full Thickness Without Exposed Support Structures Medium Serosanguineous red, brown  Distinct, outline attached Medium (34-66%) Red Medium (34-66%) Adherent Slough Fat Layer (Subcutaneous Tissue): Yes N/A Small (1-33%) Debridement - Selective/Open Wound N/A 11:00 Lidocaine 4% Topical Solution Slough Non-Viable Tissue 1.96 Curette Minimum  Pressure 0 0 Procedure was tolerated well 1x2.5x0.1 0.196 Scarring: Yes Ecchymosis: Yes No Abnormality Debridement] [N/A:N/A N/A N/A N/A N/A N/A N/A N/A N/A N/A N/A N/A N/A N/A N/A N/A N/A N/A N/A N/A N/A N/A N/A N/A N/A N/A N/A N/A N/A N/A N/A N/A N/A  N/A N/A N/A N/A N/A N/A N/A N/A N/A] Treatment  Notes Wound #12 (Toe Fifth) Wound Laterality: Left Cleanser Peri-Wound Care Topical Primary Dressing Secondary Dressing Secured With Compression Wrap Compression Stockings Add-Ons [place], [name] ([id number]) [id number] Page 5 of 10 Wound #13 (Lower Leg) Wound Laterality: Right, Lateral Cleanser Peri-Wound Care Topical Primary Dressing Maxorb Extra Ag+ Alginate Dressing, 4x4.75 (in/in) Discharge Instruction: Apply to wound bed as instructed Secondary Dressing Secured With Compression Wrap Urgo K2 Lite, (equivalent to a 3 layer) two layer compression system, regular Discharge Instruction: Apply Urgo K2 Lite as directed (alternative to 3 layer compression). Compression Stockings Add-Ons Electronic Signature(s) Signed: 08/28/2022 8:23:43 AM By: Duanne Guess MD FACS Entered By: Duanne Guess on 08/28/2022 08:23:43 -------------------------------------------------------------------------------- Multi-Disciplinary Care Plan Details Patient Name: Date of Service: [name], [name] 08/18/2022 10:45 A M Medical Record Number: [id number] Patient Account Number: [id number] Date of Birth/Sex: Treating RN: [birth year] (89 y.o. Gevena Mart Primary Care Merlina Marchena: Romeo Rabon Other Clinician: Referring Jahki Witham: Treating Hilmar Moldovan/Extender: [name] in Treatment: 21 Multidisciplinary Care Plan reviewed with physician Active Inactive Abuse / Safety / Falls / Self Care Management Nursing Diagnoses: History of Falls Impaired physical mobility Potential for falls Goals: Patient will not experience any injury related to falls Date Initiated: 03/20/2022 Date Inactivated: 05/29/2022 Target Resolution Date: 05/05/2022 Goal Status: Met Patient/caregiver will demonstrate safe use of adaptive devices to increase mobility Date Initiated: 03/20/2022 Date Inactivated: 04/27/2022 Target Resolution Date: 05/05/2022 Goal Status:  Met Patient/caregiver will verbalize/demonstrate measures taken to prevent injury and/or falls Date Initiated: 05/29/2022 Target Resolution Date: 08/30/2022 Goal Status: Active Interventions: Assess: immobility, friction, shearing, incontinence upon admission and as needed Provide education on fall prevention NotesSHERRYANN, FRESE ([id number]) [id number] Page 6 of 10 Venous Leg Ulcer Nursing Diagnoses: Knowledge deficit related to disease process and management Potential for venous Insuffiency (use before diagnosis confirmed) Goals: Patient will maintain optimal edema control Date Initiated: 05/22/2022 Target Resolution Date: 08/30/2022 Goal Status: Active Interventions: Assess peripheral edema status every visit. Compression as ordered Provide education on venous insufficiency Treatment Activities: Therapeutic compression applied : 05/22/2022 Notes: Wound/Skin Impairment Nursing Diagnoses: Impaired tissue integrity Knowledge deficit related to ulceration/compromised skin integrity Goals: Patient/caregiver will verbalize understanding of skin care regimen Date Initiated: 03/20/2022 Target Resolution Date: 08/30/2022 Goal Status: Active Interventions: Assess ulceration(s) every visit Treatment Activities: Skin care regimen initiated : 03/20/2022 Topical wound management initiated : 03/20/2022 Notes: Electronic Signature(s) Signed: 08/23/2022 11:11:00 AM By: Brenton Grills Entered By: Brenton Grills on 08/21/2022 12:12:47 -------------------------------------------------------------------------------- Pain Assessment Details Patient Name: Date of Service: [name], [name] 08/18/2022 10:45 A M Medical Record  Number: [id number] Patient Account Number: [id number] Date of Birth/Sex: Treating RN: 22-Jul-1930 (89 y.o. Gevena Mart Primary Care Biviana Saddler: Romeo Rabon Other Clinician: Referring Deland Slocumb: Treating Pearl Bents/Extender: [name] in Treatment: 21 Active Problems Location of Pain Severity and Description of Pain Patient Has Paino No Site Locations [place], [place] ([id number]) [id number] Page 7 of 10 Pain Management and Medication Current Pain Management: Electronic Signature(s) Signed: 08/23/2022 11:11:00 AM By: Brenton Grills Entered By: Brenton Grills on 08/21/2022 12:08:22 -------------------------------------------------------------------------------- Patient/Caregiver Education Details Patient Name: Date of Service: [name], [name] 7/19/2024andnbsp10:45 A M Medical Record Number: [id number] Patient Account Number: [id number] Date of Birth/Gender: Treating RN: 1930/09/12 (89 y.o. Gevena Mart Primary Care Physician: Romeo Rabon Other Clinician: Referring Physician: Treating Physician/Extender: [name] in Treatment: 21 Education Assessment Education Provided To: Patient Education Topics Provided Wound/Skin Impairment: Methods: Explain/Verbal Responses: State content correctly Electronic Signature(s) Signed: 08/23/2022 11:11:00 AM By: Brenton Grills Entered By: Brenton Grills on 08/21/2022 12:13:13 -------------------------------------------------------------------------------- Wound Assessment Details Patient Name: Date of Service: [name], [name] 08/18/2022 10:45 A M Medical Record Number: [id number] Patient Account Number: [id number] Date of Birth/Sex: Treating RN: 03-29-1930 (89 y.o. Gevena Mart Primary Care Shakira Los: Romeo Rabon Other Clinician: Referring Baron Parmelee: Treating Falecia Vannatter/Extender: Marcie Bal McMurray, [name] ([id number]) [id number] Page 8 of 10 Weeks in Treatment: 21 Wound Status Wound Number: 12 Primary Lymphedema Etiology: Wound Location: Left T Fifth oe Wound Healed - Epithelialized Wounding Event: Gradually  Appeared Status: Date Acquired: 07/10/2022 Comorbid Cataracts, Lymphedema, Sleep Apnea, Coronary Artery Disease, Weeks Of Treatment: 5 History: Hypertension, Hepatitis B, Osteoarthritis Clustered Wound: No Wound Measurements Length: (cm) Width: (cm) Depth: (cm) Area: (cm) Volume: (cm) 0 % Reduction in Area: 100% 0 % Reduction in Volume: 100% 0 Epithelialization: Large (67-100%) 0 Tunneling: No 0 Undermining: No Wound Description Classification: Full Thickness Without Exposed Support Wound Margin: Distinct, outline attached Exudate Amount: None Present Structures Foul Odor After Cleansing: No Slough/Fibrino No Wound Bed Granulation Amount: None Present (0%) Exposed Structure Necrotic Amount: Large (67-100%) Fascia Exposed: No Necrotic Quality: Eschar Fat Layer (Subcutaneous Tissue) Exposed: No Tendon Exposed: No Muscle Exposed: No Joint Exposed: No Bone Exposed: No Periwound Skin Texture Texture Color No Abnormalities Noted: No No Abnormalities Noted: Yes Callus: Yes Temperature / Pain Temperature: No Abnormality Moisture No Abnormalities Noted: Yes Treatment Notes Wound #12 (Toe Fifth) Wound Laterality: Left Cleanser Peri-Wound Care Topical Primary Dressing Secondary Dressing Secured With Compression Wrap Compression Stockings Add-Ons Electronic Signature(s) Signed: 08/23/2022 11:11:00 AM By: Brenton Grills Entered By: Brenton Grills on 08/21/2022 12:09:37 -------------------------------------------------------------------------------- Wound Assessment Details Patient Name: Date of Service: [name], [name] 08/18/2022 10:45 A M Medical Record Number: [id number] Patient Account Number: [id number] ANGELL, PINCOCK ([id number]) [phone].pdf Page 9 of 10 Date of Birth/Sex: Treating RN: 1930/08/09 (89 y.o. Gevena Mart Primary Care Haevyn Ury: Other Clinician: Romeo Rabon Referring Laiklyn Pilkenton: Treating Keylen Uzelac/Extender: [name] in Treatment: 21 Wound Status Wound Number: 13 Primary Lymphedema Etiology: Wound Location: Right, Lateral Lower Leg Wound Open Wounding Event: Contusion/Bruise Status: Date Acquired: 07/26/2022 Comorbid Cataracts, Lymphedema, Sleep Apnea, Coronary Artery Disease, Weeks Of Treatment: 3 History: Hypertension, Hepatitis B, Osteoarthritis Clustered Wound: No Wound Measurements Length: (cm) 1 Width: (cm) 2.5 Depth: (cm) 0.1 Area: (cm) 1.963 Volume: (cm) 0.196 % Reduction in Area: 9.1% % Reduction in Volume: 9.3% Epithelialization: Small (1-33%) Tunneling: No Undermining: No Wound Description Classification: Full Thickness Without Exposed Support Structures Wound Margin: Distinct, outline  attached Exudate Amount: Medium Exudate Type: Serosanguineous Exudate Color: red, brown Foul Odor After Cleansing: No Slough/Fibrino Yes Wound Bed Granulation Amount: Medium (34-66%) Exposed Structure Granulation Quality: Red Fat Layer (Subcutaneous Tissue) Exposed: Yes Necrotic Amount: Medium (34-66%) Necrotic Quality: Adherent Slough Periwound Skin Texture Texture Color No Abnormalities Noted: No No Abnormalities Noted: No Scarring: Yes Ecchymosis: Yes Moisture Temperature / Pain No Abnormalities Noted: No Temperature: No Abnormality Electronic Signature(s) Signed: 08/23/2022 11:11:00 AM By: Brenton Grills Entered By: Brenton Grills on 08/21/2022 12:09:45 -------------------------------------------------------------------------------- Vitals Details Patient Name: Date of Service: [name], [name] 08/18/2022 10:45 A M Medical Record Number: [id number] Patient Account Number: [id number] Date of Birth/Sex: Treating RN: 07-23-1930 (89 y.o. Gevena Mart Primary Care Kimberley Speece: Romeo Rabon Other Clinician: Referring Aarionna Germer: Treating Mattalynn Crandle/Extender: [name] in Treatment: 21 Vital Signs Time Taken:  10:54 Temperature (F): 98.3 Height (in): 68 Pulse (bpm): 62 Weight (lbs): 180 Respiratory Rate (breaths/min): 18 Body Mass Index (BMI): 27.4 Blood Pressure (mmHg): 192/84 Reference Range: 80 - 120 mg / dl Electronic Signature(s) Signed: 08/23/2022 11:11:00 AM By: Netty Starring, [name] ([id number]) [id number] Page 10 of 10 Entered By: Brenton Grills on 08/21/2022 12:08:12

## 2022-08-25 ENCOUNTER — Encounter (HOSPITAL_BASED_OUTPATIENT_CLINIC_OR_DEPARTMENT_OTHER): Payer: Medicare Other | Admitting: General Surgery

## 2022-08-25 DIAGNOSIS — L97522 Non-pressure chronic ulcer of other part of left foot with fat layer exposed: Secondary | ICD-10-CM | POA: Diagnosis not present

## 2022-08-25 NOTE — Progress Notes (Signed)
[name] ([id number]) [id number] Page 1 of 8 Visit Report for 08/25/2022 Arrival Information Details Patient Name: Date of Service: [name], [place] RO L 08/25/2022 11:15 A M Medical Record Number: [id number] Patient Account Number: [id number] Date of Birth/Sex: Treating RN: [birth year] (89 y.o. F) Primary Care Olivene Cookston: Romeo Rabon Other Clinician: Referring Jovaun Levene: Treating Daffney Greenly/Extender: [name] in Treatment: 22 Visit Information History Since Last Visit All ordered tests and consults were completed: No Patient Arrived: [name] Added or deleted any medications: No Arrival Time: 11:16 Any new allergies or adverse reactions: No Accompanied By: husband Had a fall or experienced change in No Transfer Assistance: None activities of daily living that may affect Patient Identification Verified: Yes risk of falls: Secondary Verification Process Completed: Yes Signs or symptoms of abuse/neglect since last visito No Patient Requires Transmission-Based Precautions: No Hospitalized since last visit: No Patient Has Alerts: No Implantable device outside of the clinic excluding No cellular tissue based products placed in the center since last visit: Pain Present Now: No Electronic Signature(s) Signed: 08/25/2022 12:05:52 PM By: Dayton Scrape Entered By: Dayton Scrape on 08/25/2022 11:17:23 -------------------------------------------------------------------------------- Clinic Level of Care Assessment Details Patient Name: Date of Service: [name], [place] RO L 08/25/2022 11:15 A M Medical Record Number: [id number] Patient Account Number: [id number] Date of Birth/Sex: Treating RN: 10/03/1930 (89 y.o. [name] Primary Care Danicka Hourihan: Romeo Rabon Other Clinician: Referring Lamonica Trueba: Treating Benford Asch/Extender: [name] in Treatment: 22 Clinic Level of Care Assessment Items TOOL 4  Quantity Score X- 1 0 Use when only an EandM is performed on FOLLOW-UP visit ASSESSMENTS - Nursing Assessment / Reassessment []  - 0 Reassessment of Co-morbidities (includes updates in patient status) X- 1 5 Reassessment of Adherence to Treatment Plan ASSESSMENTS - Wound and Skin A ssessment / Reassessment X - Simple Wound Assessment / Reassessment - one wound 1 5 []  - 0 Complex Wound Assessment / Reassessment - multiple wounds []  - 0 Dermatologic / Skin Assessment (not related to wound area) ASSESSMENTS - Focused Assessment X- 1 5 Circumferential Edema Measurements - multi extremities []  - 0 Nutritional Assessment / Counseling / Intervention [name] ([id number]) [id number] Page 2 of 8 X- 1 5 Lower Extremity Assessment (monofilament, tuning fork, pulses) []  - 0 Peripheral Arterial Disease Assessment (using hand held doppler) ASSESSMENTS - Ostomy and/or Continence Assessment and Care []  - 0 Incontinence Assessment and Management []  - 0 Ostomy Care Assessment and Management (repouching, etc.) PROCESS - Coordination of Care X - Simple Patient / Family Education for ongoing care 1 15 []  - 0 Complex (extensive) Patient / Family Education for ongoing care X- 1 10 Staff obtains Chiropractor, Records, T Results / Process Orders est []  - 0 Staff telephones HHA, Nursing Homes / Clarify orders / etc []  - 0 Routine Transfer to another Facility (non-emergent condition) []  - 0 Routine Hospital Admission (non-emergent condition) []  - 0 New Admissions / Manufacturing engineer / Ordering NPWT Apligraf, etc. , []  - 0 Emergency Hospital Admission (emergent condition) X- 1 10 Simple Discharge Coordination []  - 0 Complex (extensive) Discharge Coordination PROCESS - Special Needs []  - 0 Pediatric / Minor Patient Management []  - 0 Isolation Patient Management []  - 0 Hearing / Language / Visual special needs []  - 0 Assessment of Community assistance  (transportation, D/C planning, etc.) []  - 0 Additional assistance / Altered mentation []  - 0 Support Surface(s) Assessment (bed, cushion, seat, etc.) INTERVENTIONS - Wound Cleansing / Measurement X - Simple Wound Cleansing -  one wound 1 5 []  - 0 Complex Wound Cleansing - multiple wounds X- 1 5 Wound Imaging (photographs - any number of wounds) []  - 0 Wound Tracing (instead of photographs) []  - 0 Simple Wound Measurement - one wound []  - 0 Complex Wound Measurement - multiple wounds INTERVENTIONS - Wound Dressings []  - 0 Small Wound Dressing one or multiple wounds X- 1 15 Medium Wound Dressing one or multiple wounds []  - 0 Large Wound Dressing one or multiple wounds X- 1 5 Application of Medications - topical []  - 0 Application of Medications - injection INTERVENTIONS - Miscellaneous []  - 0 External ear exam []  - 0 Specimen Collection (cultures, biopsies, blood, body fluids, etc.) []  - 0 Specimen(s) / Culture(s) sent or taken to Lab for analysis []  - 0 Patient Transfer (multiple staff / Nurse, adult / Similar devices) []  - 0 Simple Staple / Suture removal (25 or less) []  - 0 Complex Staple / Suture removal (26 or more) []  - 0 Hypo / Hyperglycemic Management (close monitor of Blood Glucose) [name] ([id number]) [id number] Page 3 of 8 []  - 0 Ankle / Brachial Index (ABI) - do not check if billed separately X- 1 5 Vital Signs Has the patient been seen at the hospital within the last three years: Yes Total Score: 90 Level Of Care: New/Established - Level 3 Electronic Signature(s) Signed: 08/25/2022 11:40:21 AM By: Samuella Bruin Entered By: Samuella Bruin on 08/25/2022 11:39:26 -------------------------------------------------------------------------------- Encounter Discharge Information Details Patient Name: Date of Service: [name], [name] 08/25/2022 11:15 A M Medical Record Number: [id number] Patient Account Number:  [id number] Date of Birth/Sex: Treating RN: Nov 21, 1930 (89 y.o. [name] Primary Care Yuette Putnam: Romeo Rabon Other Clinician: Referring Faithe Ariola: Treating Kayann Maj/Extender: [name] in Treatment: 22 Encounter Discharge Information Items Discharge Condition: Stable Ambulatory Status: Walker Discharge Destination: Home Transportation: Private Auto Accompanied By: husband Schedule Follow-up Appointment: No Clinical Summary of Care: Patient Declined Electronic Signature(s) Signed: 08/25/2022 11:40:21 AM By: Samuella Bruin Entered By: Samuella Bruin on 08/25/2022 11:39:56 -------------------------------------------------------------------------------- Lower Extremity Assessment Details Patient Name: Date of Service: [name], [name] 08/25/2022 11:15 A M Medical Record Number: [id number] Patient Account Number: [id number] Date of Birth/Sex: Treating RN: 1930-09-13 (89 y.o. [name] Primary Care Daishon Chui: Romeo Rabon Other Clinician: Referring Duel Conrad: Treating Amiel Mccaffrey/Extender: [name] in Treatment: 22 Edema Assessment Assessed: Kyra Searles: No] Franne Forts: No] Edema: [Left: Ye] [Right: s] Calf Left: Right: Point of Measurement: From Medial Instep 31 cm Ankle Left: Right: Point of Measurement: From Medial Instep 19.5 cm Vascular Assessment Sisk, Canisha ([id number]) [Right:[id number] Page 4 of 8] Pulses: Dorsalis Pedis Palpable: [Right:Yes] Extremity colors, hair growth, and conditions: Extremity Color: [Right:Normal] Hair Growth on Extremity: [Right:No] Temperature of Extremity: [Right:Warm] Capillary Refill: [Right:< 3 seconds] Dependent Rubor: [Right:No No] Electronic Signature(s) Signed: 08/25/2022 11:40:21 AM By: Samuella Bruin Entered By: Samuella Bruin on 08/25/2022  11:23:06 -------------------------------------------------------------------------------- Multi Wound Chart Details Patient Name: Date of Service: [name], [name] 08/25/2022 11:15 A M Medical Record Number: [id number] Patient Account Number: [id number] Date of Birth/Sex: Treating RN: 05/12/1930 (89 y.o. F) Primary Care Izabell Schalk: Romeo Rabon Other Clinician: Referring Nayleen Janosik: Treating Hallel Denherder/Extender: [name] in Treatment: 22 Vital Signs Height(in): 68 Pulse(bpm): 61 Weight(lbs): 180 Blood Pressure(mmHg): 158/78 Body Mass Index(BMI): 27.4 Temperature(F): 97.9 Respiratory Rate(breaths/min): 18 [13:Photos:] [N/A:N/A] Right, Lateral Lower Leg N/A N/A Wound Location: Contusion/Bruise N/A N/A Wounding Event: Lymphedema N/A N/A Primary Etiology: Cataracts, Lymphedema, Sleep N/A  N/A Comorbid History: Apnea, Coronary Artery Disease, Hypertension, Hepatitis B, Osteoarthritis 07/26/2022 N/A N/A Date Acquired: 4 N/A N/A Weeks of Treatment: Healed - Epithelialized N/A N/A Wound Status: No N/A N/A Wound Recurrence: 0x0x0 N/A N/A Measurements L x W x D (cm) 0 N/A N/A A (cm) : rea 0 N/A N/A Volume (cm) : 100.00% N/A N/A % Reduction in Area: 100.00% N/A N/A % Reduction in Volume: Full Thickness Without Exposed N/A N/A Classification: Support Structures None Present N/A N/A Exudate Amount: Distinct, outline attached N/A N/A Wound Margin: None Present (0%) N/A N/A Granulation Amount: None Present (0%) N/A N/A Necrotic Amount: Fascia: No N/A N/A Exposed Structures: Fat Layer (Subcutaneous Tissue): No Tendon: No Muscle: No Joint: No ZENG, Okey [name] ([id number]) [id number] Page 5 of 8 Bone: No Large (67-100%) N/A N/A Epithelialization: Scarring: Yes N/A N/A Periwound Skin Texture: Ecchymosis: Yes N/A N/A Periwound Skin Color: No Abnormality N/A N/A Temperature: Treatment Notes Electronic  Signature(s) Signed: 08/25/2022 11:40:28 AM By: Duanne Guess MD FACS Entered By: Duanne Guess on 08/25/2022 11:40:28 -------------------------------------------------------------------------------- Multi-Disciplinary Care Plan Details Patient Name: Date of Service: [name], [name] 08/25/2022 11:15 A M Medical Record Number: [id number] Patient Account Number: [id number] Date of Birth/Sex: Treating RN: 12-25-1930 (89 y.o. [name] Primary Care Cameren Odwyer: Romeo Rabon Other Clinician: Referring Aoi Kouns: Treating Kennth Vanbenschoten/Extender: [name] in Treatment: 22 Multidisciplinary Care Plan reviewed with physician Active Inactive Electronic Signature(s) Signed: 08/25/2022 11:40:21 AM By: Gelene Mink By: Samuella Bruin on 08/25/2022 11:38:52 -------------------------------------------------------------------------------- Pain Assessment Details Patient Name: Date of Service: [name], [name] 08/25/2022 11:15 A M Medical Record Number: [id number] Patient Account Number: [id number] Date of Birth/Sex: Treating RN: 31-Aug-1930 (89 y.o. F) Primary Care Decarlos Empey: Romeo Rabon Other Clinician: Referring Dmitri Pettigrew: Treating Nekayla Heider/Extender: [name] in Treatment: 22 Active Problems Location of Pain Severity and Description of Pain Patient Has Paino No Site Locations [place], [place] ([id number]) [id number] Page 6 of 8 Pain Management and Medication Current Pain Management: Electronic Signature(s) Signed: 08/25/2022 12:05:52 PM By: Dayton Scrape Entered By: Dayton Scrape on 08/25/2022 11:18:00 -------------------------------------------------------------------------------- Patient/Caregiver Education Details Patient Name: Date of Service: [name], [name] 7/26/2024andnbsp11:15 A M Medical Record Number: [id number] Patient Account Number: [id number] Date of Birth/Gender:  Treating RN: 1930/03/13 (89 y.o. [name] Primary Care Physician: Romeo Rabon Other Clinician: Referring Physician: Treating Physician/Extender: [name] in Treatment: 22 Education Assessment Education Provided To: Patient Education Topics Provided Safety: Methods: Explain/Verbal Responses: Reinforcements needed, State content correctly Electronic Signature(s) Signed: 08/25/2022 11:40:21 AM By: Samuella Bruin Entered By: Samuella Bruin on 08/25/2022 11:26:08 -------------------------------------------------------------------------------- Wound Assessment Details Patient Name: Date of Service: [name], [name] 08/25/2022 11:15 A M Medical Record Number: [id number] Patient Account Number: [id number] Date of Birth/Sex: Treating RN: 01/21/1931 (89 y.o. F) Primary Care Cordero Surette: Romeo Rabon Other Clinician: Referring Adiva Boettner: Treating Saraann Enneking/Extender: Marcie Bal [place], Okey [name] ([id number]) [id number] Page 7 of 8 Weeks in Treatment: 22 Wound Status Wound Number: 13 Primary Lymphedema Etiology: Wound Location: Right, Lateral Lower Leg Wound Healed - Epithelialized Wounding Event: Contusion/Bruise Status: Date Acquired: 07/26/2022 Comorbid Cataracts, Lymphedema, Sleep Apnea, Coronary Artery Disease, Weeks Of Treatment: 4 History: Hypertension, Hepatitis B, Osteoarthritis Clustered Wound: No Photos Wound Measurements Length: (cm) Width: (cm) Depth: (cm) Area: (cm) Volume: (cm) 0 % Reduction in Area: 100% 0 % Reduction in Volume: 100% 0 Epithelialization: Large (67-100%) 0 Tunneling: No 0 Undermining: No Wound Description Classification: Full Thickness Without Exposed  Support Structures Wound Margin: Distinct, outline attached Exudate Amount: None Present Foul Odor After Cleansing: No Slough/Fibrino No Wound Bed Granulation Amount: None Present (0%) Exposed  Structure Necrotic Amount: None Present (0%) Fascia Exposed: No Fat Layer (Subcutaneous Tissue) Exposed: No Tendon Exposed: No Muscle Exposed: No Joint Exposed: No Bone Exposed: No Periwound Skin Texture Texture Color No Abnormalities Noted: No No Abnormalities Noted: No Scarring: Yes Ecchymosis: Yes Moisture Temperature / Pain No Abnormalities Noted: No Temperature: No Abnormality Electronic Signature(s) Signed: 08/25/2022 11:40:21 AM By: Samuella Bruin Entered By: Samuella Bruin on 08/25/2022 11:32:22 -------------------------------------------------------------------------------- Vitals Details Patient Name: Date of Service: [name], [name] 08/25/2022 11:15 A M Medical Record Number: [id number] Patient Account Number: [id number] Date of Birth/Sex: Treating RN: 1930-06-27 (89 y.o. F) Primary Care Bobbyjoe Pabst: Romeo Rabon Other Clinician: Referring Callin Ashe: Treating Larnie Heart/Extender: [name] in Treatment: [address] [place] ([id number]) [id number] Page 8 of 8 Vital Signs Time Taken: 11:17 Temperature (F): 97.9 Height (in): 68 Pulse (bpm): 61 Weight (lbs): 180 Respiratory Rate (breaths/min): 18 Body Mass Index (BMI): 27.4 Blood Pressure (mmHg): 158/78 Reference Range: 80 - 120 mg / dl Electronic Signature(s) Signed: 08/25/2022 12:05:52 PM By: Dayton Scrape Entered By: Dayton Scrape on 08/25/2022 11:17:49

## 2022-08-25 NOTE — Progress Notes (Signed)
Linda, [name] ([id number]) [id number] Page 1 of 8 Visit Report for 08/25/2022 Chief Complaint Document Details Patient Name: Date of Service: [name], [place] RO L 08/25/2022 11:15 A M Medical Record Number: [id number] Patient Account Number: [id number] Date of Birth/Sex: Treating RN: [birth year] (89 y.o. F) Primary Care Provider: Romeo Rabon Other Clinician: Referring Provider: Treating Provider/Extender: [name] in Treatment: 22 Information Obtained from: Patient Chief Complaint Patient presents for treatment of open ulcers due to venous insufficiency Electronic Signature(s) Signed: 08/25/2022 11:41:55 AM By: Duanne Guess MD FACS Entered By: Duanne Guess on 08/25/2022 11:41:55 -------------------------------------------------------------------------------- HPI Details Patient Name: Date of Service: [name], [name] 08/25/2022 11:15 A M Medical Record Number: [id number] Patient Account Number: [id number] Date of Birth/Sex: Treating RN: 1930/08/14 (89 y.o. F) Primary Care Provider: Romeo Rabon Other Clinician: Referring Provider: Treating Provider/Extender: [name] in Treatment: 22 History of Present Illness HPI Description: ADMISSION 03/20/2022 This is an 89 year old woman with coronary artery disease, on chronic corticosteroids for osteoarthritis, who presents with bilateral lower leg ulcers. They have been present for a couple of weeks. I do not have any vascular studies available to discern whether or not she has had venous reflux or arterial studies done. ABIs in clinic were 1.34 on the left and 0.95 on the right. She is not diabetic. She does not smoke. Her legs are quite edematous and she has multiple hematomas of various sizes and ages extending up to at least the distal thigh. She has 3 ulcers on the left leg with slough and eschar accumulation. On the right leg, she has a tiny  superficial ulcer on the anterior tibial surface. She also has a small ulcer on the lateral aspect of her right third toe. 03/27/2022: The ulcer on her right anterior tibial surface and lateral third toe are both healed. The ulcers on her left leg are smaller but have slough accumulation. Edema control is good. Her juxta lite stockings arrived in the mail, but she did not bring them with her today. 04/06/2022: She has a new skin tear just distal to her left knee. The ulcer on her left upper posterior calf is nearly healed under a layer of eschar. She has a spot on her right second toe that she is concerned about, saying that she frequently gets an ulcer there, but I do not see any open wound in this site. 04/21/2022: All of her wounds are healed except for the skin tear distal to her left knee. There is a layer of eschar on the surface. Underneath, the wound is quite superficial and clean. 04/19/2022: All of her wounds are healed. She is wearing a juxta lite stocking on her right leg and has brought her other stocking for [place] to apply to her left. 05/22/2022: She returns today with new ulcers on her left leg. A large hematoma/blood blister on her upper lateral leg, just distal to the knee opened up shortly after she was healed out from our clinic. There is slough on the wound surface, but no concern for infection. She has a second ulcer on her anterior tibial surface with slough on the surface. The small site on her right second toe that we have been monitoring has opened. There is some slough in the center with surrounding callus. She has a hematoma/blood blister on the medial aspect of her right lower leg that is not open, but it is certainly threatening to do so. 05/29/2022: The hematoma that had threatened to open  last week is now open. There is slough and hematoma and nonviable subcutaneous tissue present. The toe ulcer is down to a small pinhole, but there is some undermining from some dry skin present.  The ulcers on her left leg are both smaller. There is some slough accumulation on both. 06/05/2022: The more proximal of the left leg ulcers is closed by about 50%. There is some eschar on the wound surface. The anterior left leg ulcer is nearly Harvey, [name] ([id number]) [id number] Page 2 of 8 healed with just a little eschar on the remaining open portion. The lateral foot ulcer has some eschar and slough present the most recent right lower leg wound has slough and fat exposed. The right second toe ulcer looks about the same with a layer of callus and slough. 06/13/2022: The anterior left leg ulcer is closed. The proximal left lower leg ulcer is down to just about a centimeter with some slough and eschar on the surface. The right foot ulcer is healed. The right anterior lower leg wound continues to accumulate slough but is a little bit smaller today. The right second toe ulcer is smaller underneath the layer of callus and slough. 06/19/2022: The proximal left lower leg ulcer is down to just a couple of millimeters. It is clean. The right anterior lower leg wound is smaller again today, but still with some slough buildup. The right second toe ulcer is down to just a pinpoint opening underneath some callus. 07/03/2022: The left lower leg ulcer is closed. The right anterior lower leg wound is smaller again this week. There is a little bit of light slough accumulation. The right second toe ulcer is unchanged underneath its layer of callus. She is also complaining of pain related to a callus in her midfoot as well as one on her fifth toe. 07/10/2022: The right anterior lower leg wound is nearly closed underneath the layer of eschar. The right second toe ulcer appears to have healed. The fifth toe callus on her left foot has reaccumulated and she says it is causing her pain. After debridement, I discovered an ulcer at the base of this callus. 07/17/2022: The right anterior lower leg  wound is closed. The right second toe ulcer remains closed. The fifth toe ulcer on her left foot has reaccumulated some callus. The small wound remains open with some slough buildup. 07/28/2022: She has a new skin tear on her right lateral lower leg. She says it happened while she was pulling up her stockings, but denies snagging it with her jewelry or a fingernail. The skin flap seems to be at least partially adhered down in the exposed area is clean, but it does extend to the fat layer. The wound on her lateral fifth toe on the left is nearly closed under a layer of callus. The right second toe wound has not reopened. 08/04/2022: Much of the skin at the site of her skin tear has survived. There is still a band of tissue that appears compromised and may not ultimately survive. There is slough on the wound surface. The left fifth toe has a tiny pinhole opening that remains under some callus and slough. There is an area on her right fifth toe, mirroring the left, that is somewhat discolored but not yet open. 08/11/2022: The left fifth toe site has healed. The right fifth toe site, however, looks more suspicious today and after the callus was removed, a small opening was appreciated. The right lower leg wound is smaller. There is  slough on the surface. 08/25/2022: Her wounds are healed. Electronic Signature(s) Signed: 08/25/2022 11:42:39 AM By: Duanne Guess MD FACS Entered By: Duanne Guess on 08/25/2022 11:42:38 -------------------------------------------------------------------------------- Physical Exam Details Patient Name: Date of Service: [name], [name] 08/25/2022 11:15 A M Medical Record Number: [id number] Patient Account Number: [id number] Date of Birth/Sex: Treating RN: [birth year] (89 y.o. F) Primary Care Provider: Romeo Rabon Other Clinician: Referring Provider: Treating Provider/Extender: [name] in Treatment: 22 Constitutional Hypertensive,  asymptomatic. . . . no acute distress. Respiratory Normal work of breathing on room air. Notes 08/25/2022: Her wounds are healed. Electronic Signature(s) Signed: 08/25/2022 11:43:28 AM By: Duanne Guess MD FACS Entered By: Duanne Guess on 08/25/2022 11:43:28 -------------------------------------------------------------------------------- Physician Orders Details Patient Name: Date of Service: [name], [name] 08/25/2022 11:15 A M Medical Record Number: [id number] Patient Account Number: [id number] TRISTI, ECONOMY ([id number]) [id number] Page 3 of 8 Date of Birth/Sex: Treating RN: 08-Feb-1930 (89 y.o. [name] Primary Care Provider: Other Clinician: Romeo Rabon Referring Provider: Treating Provider/Extender: [name] in Treatment: 22 Verbal / Phone Orders: No Diagnosis Coding ICD-10 Coding Code Description [phone] 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ed L97.512 Non-pressure chronic ulcer of other part of right foot with fat layer exposed L84 Corns and callosities R60.0 Localized edema I87.2 Venous insufficiency (chronic) (peripheral) Z79.52 Long term (current) use of systemic steroids Discharge From [facility] Discharge from Wound Care Center - Congratulations!!! Anesthetic (In clinic) Topical Lidocaine 4% applied to wound bed Edema Control - Lymphedema / SCD / Other Elevate legs to the level of the heart or above for 30 minutes daily and/or when sitting for 3-4 times a day throughout the day. Avoid standing for long periods of time. Exercise regularly Moisturize legs daily. - left leg daily Compression stocking or Garment 20-30 mm/Hg pressure to: - juxtalite to left leg daily Patient Medications llergies: clindamycin, codeine, Panlor (hydrocodone-acetamin), nitrofurantoin, oxycodone, amoxicillin A Notifications Medication Indication Start  End 08/25/2022 lidocaine DOSE topical 4 % cream - cream topical Electronic Signature(s) Signed: 08/25/2022 11:43:50 AM By: Duanne Guess MD FACS Previous Signature: 08/25/2022 11:40:21 AM Version By: Samuella Bruin Entered By: Duanne Guess on 08/25/2022 11:43:49 -------------------------------------------------------------------------------- Problem List Details Patient Name: Date of Service: [name], [name] 08/25/2022 11:15 A M Medical Record Number: [id number] Patient Account Number: [id number] Date of Birth/Sex: Treating RN: 1930-12-18 (89 y.o. 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 foot with fat layer exposed 08/11/2022 No Yes ELLIEROSE, HAILU ([id number]) [id number] Page 4 of 8 L84 Corns and callosities 06/05/2022 No Yes R60.0 Localized edema 03/20/2022 No Yes I87.2 Venous insufficiency (chronic) (peripheral) 03/20/2022 No Yes Z79.52 Long term (current) use of systemic steroids 03/20/2022 No Yes Inactive Problems Resolved Problems ICD-10 Code Description Active Date Resolved Date L97.811 Non-pressure chronic ulcer of other part of right lower leg limited to breakdown of skin 03/20/2022 03/20/2022 V56.433 Non-pressure chronic ulcer of other part of left lower leg with fat layer exposed 05/22/2022 03/20/2022 L97.511 Non-pressure chronic ulcer of other part of right foot limited to breakdown of skin 03/20/2022 03/20/2022 L97.812 Non-pressure chronic ulcer of other part of right lower leg with fat  layer exposed 05/29/2022 05/29/2022 L97.522 Non-pressure chronic ulcer of other part of left foot  with fat layer exposed 07/10/2022 07/10/2022 Electronic Signature(s) Signed: 08/25/2022 11:40:20 AM By: Duanne Guess MD FACS Entered By: Duanne Guess on 08/25/2022 11:40:20 -------------------------------------------------------------------------------- Progress Note Details Patient Name: Date of Service: [name], [name] 08/25/2022 11:15 A M Medical Record Number: [id number] Patient Account Number: [id number] Date of Birth/Sex: Treating RN: 1930/05/11 (89 y.o. F) Primary Care Provider: Romeo Rabon Other Clinician: Referring Provider: Treating Provider/Extender: [name] in Treatment: 22 Subjective Chief Complaint Information obtained from Patient Patient presents for treatment of open ulcers due to venous insufficiency History of Present Illness (HPI) ADMISSION 03/20/2022 This is an 89 year old woman with coronary artery disease, on chronic corticosteroids for osteoarthritis, who presents with bilateral lower leg ulcers. They have been present for a couple of weeks. I do not have any vascular studies available to discern whether or not she has had venous reflux or arterial studies done. ABIs in clinic were 1.34 on the left and 0.95 on the right. She is not diabetic. She does not smoke. Her legs are quite edematous and she has multiple hematomas of various sizes and ages extending up to at least the distal thigh. She has 3 ulcers on the left Strayhorn, [name] ([id number]) [id number] Page 5 of 8 leg with slough and eschar accumulation. On the right leg, she has a tiny superficial ulcer on the anterior tibial surface. She also has a small ulcer on the lateral aspect of her right third toe. 03/27/2022: The ulcer on her right anterior tibial surface and lateral third toe are both healed. The ulcers on her left leg are smaller but have slough accumulation. Edema control is good. Her juxta lite stockings arrived in the mail, but she  did not bring them with her today. 04/06/2022: She has a new skin tear just distal to her left knee. The ulcer on her left upper posterior calf is nearly healed under a layer of eschar. She has a spot on her right second toe that she is concerned about, saying that she frequently gets an ulcer there, but I do not see any open wound in this site. 04/21/2022: All of her wounds are healed except for the skin tear distal to her left knee. There is a layer of eschar on the surface. Underneath, the wound is quite superficial and clean. 04/19/2022: All of her wounds are healed. She is wearing a juxta lite stocking on her right leg and has brought her other stocking for [place] to apply to her left. 05/22/2022: She returns today with new ulcers on her left leg. A large hematoma/blood blister on her upper lateral leg, just distal to the knee opened up shortly after she was healed out from our clinic. There is slough on the wound surface, but no concern for infection. She has a second ulcer on her anterior tibial surface with slough on the surface. The small site on her right second toe that we have been monitoring has opened. There is some slough in the center with surrounding callus. She has a hematoma/blood blister on the medial aspect of her right lower leg that is not open, but it is certainly threatening to do so. 05/29/2022: The hematoma that had threatened to open last week is now open. There is slough and hematoma and nonviable subcutaneous tissue present. The toe ulcer is down to a small pinhole, but there is some undermining from some dry skin present. The ulcers  on her left leg are both smaller. There is some slough accumulation on both. 06/05/2022: The more proximal of the left leg ulcers is closed by about 50%. There is some eschar on the wound surface. The anterior left leg ulcer is nearly healed with just a little eschar on the remaining open portion. The lateral foot ulcer has some eschar and slough present  the most recent right lower leg wound has slough and fat exposed. The right second toe ulcer looks about the same with a layer of callus and slough. 06/13/2022: The anterior left leg ulcer is closed. The proximal left lower leg ulcer is down to just about a centimeter with some slough and eschar on the surface. The right foot ulcer is healed. The right anterior lower leg wound continues to accumulate slough but is a little bit smaller today. The right second toe ulcer is smaller underneath the layer of callus and slough. 06/19/2022: The proximal left lower leg ulcer is down to just a couple of millimeters. It is clean. The right anterior lower leg wound is smaller again today, but still with some slough buildup. The right second toe ulcer is down to just a pinpoint opening underneath some callus. 07/03/2022: The left lower leg ulcer is closed. The right anterior lower leg wound is smaller again this week. There is a little bit of light slough accumulation. The right second toe ulcer is unchanged underneath its layer of callus. She is also complaining of pain related to a callus in her midfoot as well as one on her fifth toe. 07/10/2022: The right anterior lower leg wound is nearly closed underneath the layer of eschar. The right second toe ulcer appears to have healed. The fifth toe callus on her left foot has reaccumulated and she says it is causing her pain. After debridement, I discovered an ulcer at the base of this callus. 07/17/2022: The right anterior lower leg wound is closed. The right second toe ulcer remains closed. The fifth toe ulcer on her left foot has reaccumulated some callus. The small wound remains open with some slough buildup. 07/28/2022: She has a new skin tear on her right lateral lower leg. She says it happened while she was pulling up her stockings, but denies snagging it with her jewelry or a fingernail. The skin flap seems to be at least partially adhered down in the exposed area  is clean, but it does extend to the fat layer. The wound on her lateral fifth toe on the left is nearly closed under a layer of callus. The right second toe wound has not reopened. 08/04/2022: Much of the skin at the site of her skin tear has survived. There is still a band of tissue that appears compromised and may not ultimately survive. There is slough on the wound surface. The left fifth toe has a tiny pinhole opening that remains under some callus and slough. There is an area on her right fifth toe, mirroring the left, that is somewhat discolored but not yet open. 08/11/2022: The left fifth toe site has healed. The right fifth toe site, however, looks more suspicious today and after the callus was removed, a small opening was appreciated. The right lower leg wound is smaller. There is slough on the surface. 08/25/2022: Her wounds are healed. Patient History Information obtained from Patient. Family History Heart Disease - Father, Hypertension - Mother, No family history of Cancer, Diabetes, Hereditary Spherocytosis, Kidney Disease, Lung Disease, Seizures, Stroke, Thyroid Problems, Tuberculosis. Social History Never  smoker, Marital Status - Married, Alcohol Use - Rarely, Drug Use - No History, Caffeine Use - Moderate. 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 - cardiac catheterization. - cataract extraction bilat.. - colonoscopy. - bilateral total knee replacement. - upper GI endoscopy. Medical A Surgical History Notes nd Gastrointestinal diverticulosis, GERD Musculoskeletal osteoporosis Linda, [name] ([id number])  [id number] Page 6 of 8 Objective Constitutional Hypertensive, asymptomatic. no acute distress. Vitals Time Taken: 11:17 AM, Height: 68 in, Weight: 180 lbs, BMI: 27.4, Temperature: 97.9 F, Pulse: 61 bpm, Respiratory Rate: 18 breaths/min, Blood Pressure: 158/78 mmHg. Respiratory Normal work of breathing on room air. General Notes: 08/25/2022: Her wounds are healed. Integumentary (Hair, Skin) Wound #13 status is Healed - Epithelialized. Original cause of wound was Contusion/Bruise. The date acquired was: 07/26/2022. The wound has been in treatment 4 weeks. The wound is located on the Right,Lateral Lower Leg. The wound measures 0cm length x 0cm width x 0cm depth; 0cm^2 area and 0cm^3 volume. There is no tunneling or undermining noted. There is a none present amount of drainage noted. The wound margin is distinct with the outline attached to the wound base. There is no granulation within the wound bed. There is no necrotic tissue within the wound bed. The periwound skin appearance exhibited: Scarring, Ecchymosis. Periwound temperature was noted as No Abnormality. 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 Services: Discharge from Wound Care Center - Congratulations!!! Anesthetic: (In clinic) Topical Lidocaine 4% applied to wound bed Edema Control - Lymphedema / SCD / Other: Elevate legs to the level of the heart or above for 30 minutes daily and/or when sitting for 3-4 times a day throughout the day. Avoid standing for long periods of time. Exercise regularly Moisturize legs daily. - left leg daily Compression stocking or Garment 20-30 mm/Hg pressure to: -  juxtalite to left leg daily The following medication(s) was prescribed: lidocaine topical 4 % cream cream topical was prescribed at facility 08/25/2022: Her wounds are healed. She was reminded of the importance of wearing her compression garments and elevating her legs is much as possible throughout the day. We will discharge her from the wound care center. She may follow-up as needed. Electronic Signature(s) Signed: 08/25/2022 11:44:29 AM By: Duanne Guess MD FACS Entered By: Duanne Guess on 08/25/2022 11:44:28 Peale, [name] ([id number]) [id number] Page 7 of 8 -------------------------------------------------------------------------------- HxROS Details Patient Name: Date of Service: [name], [place] RO L 08/25/2022 11:15 A M Medical Record Number: [id number] Patient Account Number: [id number] Date of Birth/Sex: Treating RN: 1930/07/25 (89 y.o. F) Primary Care Provider: Romeo Rabon Other Clinician: Referring Provider: Treating Provider/Extender: [name] in Treatment: 22 Information Obtained From Patient Eyes Medical History: Positive for: Cataracts Hematologic/Lymphatic Medical History: Positive for: Lymphedema Respiratory Medical History: Positive for: Sleep Apnea Cardiovascular Medical History: Positive for: Coronary Artery Disease; Hypertension Gastrointestinal Medical History: Positive for: Hepatitis B - 50+ yrs ago Past Medical History Notes: diverticulosis, GERD Musculoskeletal Medical History: Positive for: Osteoarthritis Past Medical History Notes: osteoporosis  HBO Extended History Items Eyes: Cataracts Immunizations Pneumococcal Vaccine: Received Pneumococcal Vaccination: Yes Received Pneumococcal Vaccination On or After 60th Birthday: Yes Implantable Devices No devices added Hospitalization / Surgery History Type of Hospitalization/Surgery abdominal hysterectomy cardiac  catheterization cataract extraction bilat. colonoscopy bilateral total knee replacement upper GI endoscopy Family and Social History Cancer: No; Diabetes: No; Heart Disease: Yes - Father; Hereditary Spherocytosis: No; Hypertension: Yes - Mother; Kidney Disease: No; Lung Disease: No; Seizures: No; Stroke: No; Thyroid Problems: No; Tuberculosis: No; Never smoker; Marital Status - Married; Alcohol Use: Rarely; Drug Use: No History; Caffeine Use: Moderate; Financial Concerns: No; Food, Clothing or Shelter Needs: No; Support System Lacking: No; Transportation Concerns: No Electronic Signature(s) Signed: 08/25/2022 11:50:23 AM By: Duanne Guess MD FACS [place], [name] ([id number]) [id number] Page 8 of 8 Entered By: Duanne Guess on 08/25/2022 11:42:50 -------------------------------------------------------------------------------- SuperBill Details Patient Name: Date of Service: Darrick Meigs 08/25/2022 Medical Record Number: [id number] Patient Account Number: [id number] Date of Birth/Sex: Treating RN: 05/31/1930 (89 y.o. 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 L84 Corns and callosities R60.0 Localized edema I87.2 Venous insufficiency (chronic) (peripheral) Z79.52 Long term (current) use of systemic steroids Facility Procedures : CPT4 Code: [id number] Description: 99213 - WOUND CARE VISIT-LEV 3 EST PT Modifier: Quantity: 1 Physician Procedures : CPT4 Code Description Modifier [id number] 99213 - WC PHYS LEVEL 3 - EST PT ICD-10 Diagnosis Description R60.0 Localized edema I87.2 Venous insufficiency  (chronic) (peripheral) Z79.52 Long term (current) use of systemic steroids Quantity: 1 Electronic Signature(s) Signed: 08/25/2022 11:44:48 AM By: Duanne Guess MD FACS Previous Signature: 08/25/2022 11:40:21 AM Version By: Samuella Bruin Entered By: Duanne Guess on 08/25/2022 11:44:48
